# Patient Record
Sex: Female | Born: 1980 | Race: Black or African American | Hispanic: No | Marital: Single | Smoking: Never smoker
Health system: Southern US, Community
[De-identification: ages and names within clinical notes are randomized; demographics above are authoritative.]

## PROBLEM LIST (undated history)

## (undated) ENCOUNTER — Inpatient Hospital Stay: Payer: Self-pay

## (undated) ENCOUNTER — Ambulatory Visit: Admission: EM | Source: Home / Self Care

## (undated) DIAGNOSIS — D259 Leiomyoma of uterus, unspecified: Secondary | ICD-10-CM

## (undated) DIAGNOSIS — G43909 Migraine, unspecified, not intractable, without status migrainosus: Secondary | ICD-10-CM

## (undated) DIAGNOSIS — Z8759 Personal history of other complications of pregnancy, childbirth and the puerperium: Secondary | ICD-10-CM

## (undated) DIAGNOSIS — D649 Anemia, unspecified: Secondary | ICD-10-CM

## (undated) DIAGNOSIS — I10 Essential (primary) hypertension: Secondary | ICD-10-CM

## (undated) DIAGNOSIS — M1990 Unspecified osteoarthritis, unspecified site: Secondary | ICD-10-CM

## (undated) DIAGNOSIS — E538 Deficiency of other specified B group vitamins: Secondary | ICD-10-CM

## (undated) HISTORY — DX: Deficiency of other specified B group vitamins: E53.8

## (undated) HISTORY — DX: Leiomyoma of uterus, unspecified: D25.9

## (undated) HISTORY — DX: Personal history of other complications of pregnancy, childbirth and the puerperium: Z87.59

## (undated) HISTORY — PX: NO PAST SURGERIES: SHX2092

---

## 2005-02-17 ENCOUNTER — Emergency Department: Payer: Self-pay | Admitting: Emergency Medicine

## 2006-01-19 ENCOUNTER — Emergency Department: Payer: Self-pay | Admitting: Emergency Medicine

## 2007-01-01 ENCOUNTER — Observation Stay: Payer: Self-pay | Admitting: Obstetrics and Gynecology

## 2007-03-20 ENCOUNTER — Observation Stay: Payer: Self-pay | Admitting: Certified Nurse Midwife

## 2007-04-28 ENCOUNTER — Observation Stay: Payer: Self-pay

## 2007-06-03 ENCOUNTER — Observation Stay: Payer: Self-pay | Admitting: Obstetrics and Gynecology

## 2007-06-08 ENCOUNTER — Inpatient Hospital Stay: Payer: Self-pay | Admitting: Obstetrics and Gynecology

## 2007-12-31 ENCOUNTER — Emergency Department: Payer: Self-pay | Admitting: Emergency Medicine

## 2008-01-19 ENCOUNTER — Emergency Department: Payer: Self-pay | Admitting: Emergency Medicine

## 2008-01-19 DIAGNOSIS — Z309 Encounter for contraceptive management, unspecified: Secondary | ICD-10-CM | POA: Insufficient documentation

## 2008-01-19 HISTORY — DX: Encounter for contraceptive management, unspecified: Z30.9

## 2008-07-12 ENCOUNTER — Emergency Department: Payer: Self-pay | Admitting: Emergency Medicine

## 2008-11-18 ENCOUNTER — Emergency Department: Payer: Self-pay | Admitting: Emergency Medicine

## 2008-11-27 ENCOUNTER — Encounter: Payer: Self-pay | Admitting: Sports Medicine

## 2008-12-18 ENCOUNTER — Encounter: Payer: Self-pay | Admitting: Sports Medicine

## 2009-01-01 ENCOUNTER — Emergency Department: Payer: Self-pay | Admitting: Emergency Medicine

## 2009-06-15 DIAGNOSIS — R5383 Other fatigue: Secondary | ICD-10-CM | POA: Insufficient documentation

## 2009-06-15 HISTORY — DX: Other fatigue: R53.83

## 2009-09-23 ENCOUNTER — Emergency Department: Payer: Self-pay | Admitting: Emergency Medicine

## 2010-03-26 DIAGNOSIS — R590 Localized enlarged lymph nodes: Secondary | ICD-10-CM | POA: Insufficient documentation

## 2010-03-26 HISTORY — DX: Localized enlarged lymph nodes: R59.0

## 2011-02-25 ENCOUNTER — Emergency Department: Payer: Self-pay | Admitting: Unknown Physician Specialty

## 2011-05-16 ENCOUNTER — Emergency Department: Payer: Self-pay | Admitting: Emergency Medicine

## 2012-03-22 ENCOUNTER — Encounter: Payer: Self-pay | Admitting: Maternal & Fetal Medicine

## 2012-07-26 ENCOUNTER — Observation Stay: Payer: Self-pay | Admitting: Obstetrics and Gynecology

## 2012-07-26 LAB — CBC WITH DIFFERENTIAL/PLATELET
Basophil #: 0 x10 3/mm 3 (ref 0.0–0.1)
Basophil %: 0.2 %
Eosinophil #: 0.1 x10 3/mm 3 (ref 0.0–0.7)
Eosinophil %: 1.6 %
HCT: 23.6 % — ABNORMAL LOW (ref 35.0–47.0)
HGB: 7.5 g/dL — ABNORMAL LOW (ref 12.0–16.0)
Lymphocyte %: 19 %
Lymphs Abs: 1.3 x10 3/mm 3 (ref 1.0–3.6)
MCH: 22.4 pg — ABNORMAL LOW (ref 26.0–34.0)
MCHC: 31.5 g/dL — ABNORMAL LOW (ref 32.0–36.0)
MCV: 71 fL — ABNORMAL LOW (ref 80–100)
Monocyte #: 0.7 x10 3/mm (ref 0.2–0.9)
Monocyte %: 9.5 %
Neutrophil #: 4.8 x10 3/mm 3 (ref 1.4–6.5)
Neutrophil %: 69.7 %
Platelet: 247 x10 3/mm 3 (ref 150–440)
RBC: 3.33 X10 6/mm 3 — ABNORMAL LOW (ref 3.80–5.20)
RDW: 19.3 % — ABNORMAL HIGH (ref 11.5–14.5)
WBC: 6.9 x10 3/mm 3 (ref 3.6–11.0)

## 2012-07-26 LAB — COMPREHENSIVE METABOLIC PANEL WITH GFR
Albumin: 2.5 g/dL — ABNORMAL LOW (ref 3.4–5.0)
Alkaline Phosphatase: 85 U/L (ref 50–136)
Anion Gap: 8 (ref 7–16)
BUN: 4 mg/dL — ABNORMAL LOW (ref 7–18)
Bilirubin,Total: 0.3 mg/dL (ref 0.2–1.0)
Calcium, Total: 8.7 mg/dL (ref 8.5–10.1)
Chloride: 110 mmol/L — ABNORMAL HIGH (ref 98–107)
Co2: 22 mmol/L (ref 21–32)
Creatinine: 0.52 mg/dL — ABNORMAL LOW (ref 0.60–1.30)
EGFR (African American): >60
EGFR (Non-African Amer.): >60
Glucose: 87 mg/dL (ref 65–99)
Osmolality: 276 (ref 275–301)
Potassium: 3.6 mmol/L (ref 3.5–5.1)
SGOT(AST): 21 U/L (ref 15–37)
SGPT (ALT): 13 U/L (ref 12–78)
Sodium: 140 mmol/L (ref 136–145)
Total Protein: 7.1 g/dL (ref 6.4–8.2)

## 2012-07-27 LAB — CBC WITH DIFFERENTIAL/PLATELET
Eosinophil #: 0.1 10*3/uL (ref 0.0–0.7)
HCT: 28.6 % — ABNORMAL LOW (ref 35.0–47.0)
Lymphocyte #: 1.5 10*3/uL (ref 1.0–3.6)
MCH: 23.6 pg — ABNORMAL LOW (ref 26.0–34.0)
MCHC: 31.9 g/dL — ABNORMAL LOW (ref 32.0–36.0)
MCV: 74 fL — ABNORMAL LOW (ref 80–100)
Monocyte #: 0.7 x10 3/mm (ref 0.2–0.9)
Monocyte %: 11.4 %
Neutrophil #: 3.7 10*3/uL (ref 1.4–6.5)
RDW: 21.3 % — ABNORMAL HIGH (ref 11.5–14.5)

## 2012-07-28 ENCOUNTER — Ambulatory Visit: Payer: Self-pay | Admitting: Hematology and Oncology

## 2012-08-03 ENCOUNTER — Ambulatory Visit: Payer: Self-pay | Admitting: Hematology and Oncology

## 2012-08-03 LAB — CBC CANCER CENTER
Basophil #: 0 x10 3/mm (ref 0.0–0.1)
Eosinophil #: 0.1 x10 3/mm (ref 0.0–0.7)
Eosinophil %: 1.7 %
HCT: 31.2 % — ABNORMAL LOW (ref 35.0–47.0)
Lymphocyte #: 1.5 x10 3/mm (ref 1.0–3.6)
Lymphocyte %: 20.5 %
Monocyte %: 4.5 %
Neutrophil #: 5.5 x10 3/mm (ref 1.4–6.5)
Neutrophil %: 72.7 %
Platelet: 240 x10 3/mm (ref 150–440)
RDW: 21.7 % — ABNORMAL HIGH (ref 11.5–14.5)
WBC: 7.5 x10 3/mm (ref 3.6–11.0)

## 2012-08-03 LAB — IRON AND TIBC
Iron Bind.Cap.(Total): 599 ug/dL — ABNORMAL HIGH (ref 250–450)
Unbound Iron-Bind.Cap.: 417 ug/dL

## 2012-08-03 LAB — FERRITIN: Ferritin (ARMC): 10 ng/mL (ref 8–388)

## 2012-08-03 LAB — COMPREHENSIVE METABOLIC PANEL
Albumin: 2.6 g/dL — ABNORMAL LOW (ref 3.4–5.0)
Alkaline Phosphatase: 105 U/L (ref 50–136)
Calcium, Total: 8.7 mg/dL (ref 8.5–10.1)
EGFR (Non-African Amer.): >60
Glucose: 139 mg/dL — ABNORMAL HIGH (ref 65–99)
SGOT(AST): 23 U/L (ref 15–37)
SGPT (ALT): 17 U/L (ref 12–78)

## 2012-08-17 ENCOUNTER — Ambulatory Visit: Payer: Self-pay | Admitting: Hematology and Oncology

## 2012-09-03 ENCOUNTER — Observation Stay: Payer: Self-pay

## 2012-09-03 LAB — URINALYSIS, COMPLETE
Bacteria: NONE SEEN
Bilirubin,UR: NEGATIVE
Blood: NEGATIVE
Glucose,UR: NEGATIVE mg/dL
Ketone: NEGATIVE
Leukocyte Esterase: NEGATIVE
Nitrite: NEGATIVE
Ph: 7
Protein: NEGATIVE
RBC,UR: 1 /HPF
Specific Gravity: 1.015
Squamous Epithelial: <1
Transitional Epi: <1
WBC UR: <1 /HPF

## 2012-09-17 ENCOUNTER — Inpatient Hospital Stay: Payer: Self-pay | Admitting: Obstetrics and Gynecology

## 2012-09-17 LAB — CBC WITH DIFFERENTIAL/PLATELET
Basophil %: 0.3 %
Eosinophil %: 1.5 %
HCT: 29.6 % — ABNORMAL LOW (ref 35.0–47.0)
HGB: 9.5 g/dL — ABNORMAL LOW (ref 12.0–16.0)
Lymphocyte #: 1.7 10*3/uL (ref 1.0–3.6)
MCV: 80 fL (ref 80–100)
Monocyte %: 10 %
Neutrophil #: 4.1 10*3/uL (ref 1.4–6.5)
RBC: 3.71 10*6/uL — ABNORMAL LOW (ref 3.80–5.20)
WBC: 6.6 10*3/uL (ref 3.6–11.0)

## 2012-09-17 LAB — RAPID HIV-1/2 QL/CONFIRM: HIV-1/2,Rapid Ql: NEGATIVE

## 2012-09-18 LAB — HEMOGLOBIN: HGB: 9.1 g/dL — ABNORMAL LOW (ref 12.0–16.0)

## 2013-06-18 ENCOUNTER — Emergency Department: Payer: Self-pay | Admitting: Emergency Medicine

## 2013-06-20 LAB — BETA STREP CULTURE(ARMC)

## 2014-02-27 ENCOUNTER — Emergency Department: Payer: Self-pay | Admitting: Emergency Medicine

## 2014-03-01 LAB — BETA STREP CULTURE(ARMC)

## 2014-07-03 ENCOUNTER — Emergency Department: Payer: Self-pay | Admitting: Emergency Medicine

## 2014-07-03 LAB — CBC
HCT: 34.7 % — ABNORMAL LOW (ref 35.0–47.0)
HGB: 11.5 g/dL — AB (ref 12.0–16.0)
MCH: 29.8 pg (ref 26.0–34.0)
MCHC: 33 g/dL (ref 32.0–36.0)
MCV: 90 fL (ref 80–100)
Platelet: 280 10*3/uL (ref 150–440)
RBC: 3.85 10*6/uL (ref 3.80–5.20)
RDW: 15.3 % — AB (ref 11.5–14.5)
WBC: 12 10*3/uL — ABNORMAL HIGH (ref 3.6–11.0)

## 2014-07-03 LAB — BASIC METABOLIC PANEL
Anion Gap: 11 (ref 7–16)
BUN: 8 mg/dL (ref 7–18)
CO2: 21 mmol/L (ref 21–32)
CREATININE: 0.77 mg/dL (ref 0.60–1.30)
Calcium, Total: 9.5 mg/dL (ref 8.5–10.1)
Chloride: 109 mmol/L — ABNORMAL HIGH (ref 98–107)
EGFR (Non-African Amer.): >60
Glucose: 97 mg/dL (ref 65–99)
OSMOLALITY: 280 (ref 275–301)
POTASSIUM: 3.7 mmol/L (ref 3.5–5.1)
Sodium: 141 mmol/L (ref 136–145)

## 2014-07-03 LAB — URINALYSIS, COMPLETE
BILIRUBIN, UR: NEGATIVE
GLUCOSE, UR: NEGATIVE mg/dL (ref 0–75)
Ketone: NEGATIVE
LEUKOCYTE ESTERASE: NEGATIVE
NITRITE: NEGATIVE
PROTEIN: NEGATIVE
Ph: 6 (ref 4.5–8.0)
RBC,UR: NONE SEEN /HPF (ref 0–5)
Specific Gravity: 1.001 (ref 1.003–1.030)
Squamous Epithelial: 1

## 2014-07-03 LAB — WET PREP, GENITAL

## 2014-07-03 LAB — TROPONIN I

## 2014-07-03 LAB — GC/CHLAMYDIA PROBE AMP

## 2014-11-13 DIAGNOSIS — R300 Dysuria: Secondary | ICD-10-CM

## 2014-11-13 DIAGNOSIS — N6452 Nipple discharge: Secondary | ICD-10-CM | POA: Insufficient documentation

## 2014-11-13 HISTORY — DX: Nipple discharge: N64.52

## 2014-11-13 HISTORY — DX: Dysuria: R30.0

## 2014-11-16 DIAGNOSIS — R87612 Low grade squamous intraepithelial lesion on cytologic smear of cervix (LGSIL): Secondary | ICD-10-CM

## 2014-11-16 HISTORY — DX: Low grade squamous intraepithelial lesion on cytologic smear of cervix (LGSIL): R87.612

## 2015-03-19 ENCOUNTER — Emergency Department
Admission: EM | Admit: 2015-03-19 | Discharge: 2015-03-19 | Disposition: A | Discharge status: Home / Self Care | Payer: No Typology Code available for payment source | Attending: Emergency Medicine | Admitting: Emergency Medicine

## 2015-03-19 ENCOUNTER — Emergency Department: Payer: No Typology Code available for payment source

## 2015-03-19 ENCOUNTER — Encounter: Payer: Self-pay | Admitting: *Deleted

## 2015-03-19 DIAGNOSIS — Y9389 Activity, other specified: Secondary | ICD-10-CM | POA: Insufficient documentation

## 2015-03-19 DIAGNOSIS — S43402A Unspecified sprain of left shoulder joint, initial encounter: Secondary | ICD-10-CM | POA: Diagnosis not present

## 2015-03-19 DIAGNOSIS — Y998 Other external cause status: Secondary | ICD-10-CM | POA: Insufficient documentation

## 2015-03-19 DIAGNOSIS — S0990XA Unspecified injury of head, initial encounter: Secondary | ICD-10-CM | POA: Diagnosis not present

## 2015-03-19 DIAGNOSIS — S4992XA Unspecified injury of left shoulder and upper arm, initial encounter: Secondary | ICD-10-CM | POA: Diagnosis present

## 2015-03-19 DIAGNOSIS — Y92410 Unspecified street and highway as the place of occurrence of the external cause: Secondary | ICD-10-CM | POA: Diagnosis not present

## 2015-03-19 HISTORY — DX: Anemia, unspecified: D64.9

## 2015-03-19 MED ORDER — CYCLOBENZAPRINE HCL 10 MG PO TABS: 10.0000 mg | ORAL_TABLET | Freq: Three times a day (TID) | ORAL | Status: AC | PRN

## 2015-03-19 MED ORDER — IBUPROFEN 800 MG PO TABS: 800.0000 mg | ORAL_TABLET | Freq: Three times a day (TID) | ORAL | Status: AC | PRN

## 2015-03-19 MED ORDER — TRAMADOL HCL 50 MG PO TABS: 50.0000 mg | ORAL_TABLET | Freq: Four times a day (QID) | ORAL | Status: AC | PRN
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
## 2015-03-19 NOTE — ED Notes (Signed)
Pt took home 1000mg  tylenol

## 2015-03-27 NOTE — H&P (Signed)
L&D Evaluation:  History:   HPI 34yo G4P300 with LMP of 01/05/12 & EDD of 10/06/12 [facility] with "LOF today waking her up". Pt just got up to BR and poured clear mucoid fluid from vagina. PNC at [facility] significant for anemia, blood transfusion, hx of pre-eclampsia.    Presents with leaking fluid    Patient's Medical History Anemia x 11 yrs    Patient's Surgical History Rt knee injury, wisdom teeth    Medications Pre Natal Vitamins    Allergies NKDA    Social History none    Family History Non-Contributory   ROS:   ROS All systems were reviewed.  HEENT, CNS, GI, GU, Respiratory, CV, Renal and Musculoskeletal systems were found to be normal.   Exam:   Vital Signs stable  BP 133/80    General no apparent distress    Mental Status clear    Chest clear    Heart normal sinus rhythm, no murmur/gallop/rubs    Abdomen gravid, non-tender    Estimated Fetal Weight Average for gestational age    Back no CVAT    Edema 1+    Reflexes 2+    Clonus negative    Pelvic 1 cm    Mebranes Ruptured, 0300    Description clear    FHT +accels, no decels    Ucx irregular    Skin dry    Lymph no lymphadenopathy   Impression:   Impression IUP at 37 1/7 weeks with SROM   Plan:   Plan monitor contractions and for cervical change, Admit to [facility]   Electronic Signatures: [name] (CNM)  (Signed [id number] 06:29)  Authored: L&D Evaluation   Last Updated: 01-Nov-13 06:29 by [name] (CNM)

## 2015-03-27 NOTE — H&P (Signed)
L&D Evaluation:  History:   HPI 34 y/o G4P3003 @ 29/5wks EDC 10/06/12 seen at [facility] 07/23/12 for 29 wk pnc with 28wk labs drawn. Anemia 7.6mg . Had been taking PNV daily and liquid/elixir fe supplement as was unable to afford cost of fe (uncovered by medicaid). HX of anemia x 11 years with transfusion of 1 unit packed red blood cells @ 37wks. Obesity, Elevated glucola 157. Negative sickle cell trait. Recent Cardiology consult (SOB tachycardia) nl per pt-await report. Pre eclampsia 1st pregnancy. Denies leaking fluid, vaginal bleeding, or contractions, baby is active.    Presents with above    Patient's Medical History severe anemia    Patient's Surgical History none    Medications Pre Natal Vitamins  Iron    Allergies NKDA    Social History none    Family History Non-Contributory   ROS:   ROS All systems were reviewed.  HEENT, CNS, GI, GU, Respiratory, CV, Renal and Musculoskeletal systems were found to be normal.   Exam:   Vital Signs stable    Urine Protein not completed    General no apparent distress    Mental Status clear    Chest clear    Heart normal sinus rhythm    Abdomen gravid, non-tender    Estimated Fetal Weight Average for gestational age    Fundal Height appropriate    Back no CVAT    Edema no edema    Reflexes 1+    Clonus negative    Pelvic no SVE    Mebranes Intact    FHT baseline 130's avg variability occ accel    FHT Description 126    Ucx absent    Skin dry    Lymph no lymphadenopathy   Impression:   Impression Severe anemia- [redacted]wks gestation Transfuse 2 units PRBC's   Plan:   Plan EFM/NST    Comments TJS consulted, pt notified of need to receive 2 units PRBC's due to severe anemia. Explained plan of care, risks/benefits of blood transfusions, questions answered, consent obtained, labs drawn. WIll have NST on LD and then move to [place] for transfusion. Pt will follow up at [facility] 07/27/12 for growth [place] and schedule next PNC/3hr glucola.    Electronic Signatures: Rosie Fate (CNM)  (Signed 09-Sep-13 14:15)  Authored: L&D Evaluation   Last Updated: 09-Sep-13 14:15 by Rosie Fate (CNM)

## 2015-09-17 ENCOUNTER — Emergency Department
Admission: EM | Admit: 2015-09-17 | Discharge: 2015-09-17 | Disposition: A | Discharge status: Home / Self Care | Payer: Self-pay | Attending: Emergency Medicine | Admitting: Emergency Medicine

## 2015-09-17 ENCOUNTER — Emergency Department: Payer: Self-pay

## 2015-09-17 ENCOUNTER — Encounter: Payer: Self-pay | Admitting: Emergency Medicine

## 2015-09-17 DIAGNOSIS — R0602 Shortness of breath: Secondary | ICD-10-CM | POA: Insufficient documentation

## 2015-09-17 DIAGNOSIS — R079 Chest pain, unspecified: Secondary | ICD-10-CM | POA: Insufficient documentation

## 2015-09-17 LAB — BASIC METABOLIC PANEL
ANION GAP: 8 (ref 5–15)
BUN: 7 mg/dL (ref 6–20)
CHLORIDE: 106 mmol/L (ref 101–111)
CO2: 24 mmol/L (ref 22–32)
Calcium: 9.4 mg/dL (ref 8.9–10.3)
Creatinine, Ser: 0.6 mg/dL (ref 0.44–1.00)
GFR calc Af Amer: >60 mL/min (ref >60)
GFR calc non Af Amer: >60 mL/min (ref >60)
GLUCOSE: 91 mg/dL (ref 65–99)
Potassium: 3.4 mmol/L — ABNORMAL LOW (ref 3.5–5.1)
Sodium: 138 mmol/L (ref 135–145)

## 2015-09-17 LAB — CBC
HCT: 33.2 % — ABNORMAL LOW (ref 35.0–47.0)
HEMOGLOBIN: 10.6 g/dL — AB (ref 12.0–16.0)
MCH: 25.1 pg — ABNORMAL LOW (ref 26.0–34.0)
MCHC: 32 g/dL (ref 32.0–36.0)
MCV: 78.3 fL — AB (ref 80.0–100.0)
Platelets: 319 10*3/uL (ref 150–440)
RBC: 4.24 MIL/uL (ref 3.80–5.20)
RDW: 16.4 % — ABNORMAL HIGH (ref 11.5–14.5)
WBC: 6.8 10*3/uL (ref 3.6–11.0)

## 2015-09-17 LAB — TROPONIN I: Troponin I: <0.03 ng/mL (ref <0.031)

## 2015-09-17 MED ORDER — IBUPROFEN 800 MG PO TABS: 800.0000 mg | ORAL_TABLET | Freq: Three times a day (TID) | ORAL | Status: DC | PRN

## 2015-09-17 MED ORDER — DIAZEPAM 5 MG PO TABS: 5.0000 mg | ORAL_TABLET | Freq: Three times a day (TID) | ORAL | Status: DC | PRN

## 2015-09-17 NOTE — Discharge Instructions (Signed)

## 2015-09-17 NOTE — ED Notes (Signed)
Pt to ed with c/o chest pain that started last night, intermittent with sob with back pain described as stabbing.

## 2015-09-17 NOTE — ED Provider Notes (Signed)
[facility] Emergency Department Provider Note     Time seen: ----------------------------------------- 2:36 PM on 09/17/2015 -----------------------------------------    I have reviewed the triage vital signs and the nursing notes.   HISTORY  Chief Complaint Chest Pain    HPI Cindy Hays is a 34 y.o. female who presents ER for chest pain started last night, she's had intermittent shortness of breath with back pain she describes as stabbing. Nothing seems to make her symptoms better or worse. Patient states the pain was under the right breast and radiated into her back. Hasn't had any heavy physical activity recently, denies any recent illness.   Past Medical History  Diagnosis Date  . Anemia     There are no active problems to display for this patient.   History reviewed. No pertinent past surgical history.  Allergies Vicodin  Social History Social History  Substance Use Topics  . Smoking status: Never Smoker   . Smokeless tobacco: None  . Alcohol Use: No    Review of Systems Constitutional: Negative for fever. Eyes: Negative for visual changes. ENT: Negative for sore throat. Cardiovascular: Positive for chest pain Respiratory: Positive for shortness of breath Gastrointestinal: Negative for abdominal pain, vomiting and diarrhea. Genitourinary: Negative for dysuria. Musculoskeletal: Negative for back pain. Skin: Negative for rash. Neurological: Negative for headaches, focal weakness or numbness.  10-point ROS otherwise negative.  ____________________________________________   PHYSICAL EXAM:  VITAL SIGNS: ED Triage Vitals  Enc Vitals Group     BP --      Pulse --      Resp --      Temp --      Temp src --      SpO2 --      Weight --      Height --      Head Cir --      Peak Flow --      Pain Score 09/17/15 1238 6     Pain Loc --      Pain Edu? --      Excl. in [place]? --     Constitutional: Alert and oriented.  Well appearing and in no distress. Eyes: Conjunctivae are normal. PERRL. Normal extraocular movements. ENT   Head: Normocephalic and atraumatic.   Nose: No congestion/rhinnorhea.   Mouth/Throat: Mucous membranes are moist.   Neck: No stridor. Cardiovascular: Normal rate, regular rhythm. Normal and symmetric distal pulses are present in all extremities. No murmurs, rubs, or gallops. Respiratory: Normal respiratory effort without tachypnea nor retractions. Breath sounds are clear and equal bilaterally. No wheezes/rales/rhonchi. Gastrointestinal: Soft and nontender. No distention. No abdominal bruits.  Musculoskeletal: Nontender with normal range of motion in all extremities. No joint effusions.  No lower extremity tenderness nor edema. Neurologic:  Normal speech and language. No gross focal neurologic deficits are appreciated. Speech is normal. No gait instability. Skin:  Skin is warm, dry and intact. No rash noted. Psychiatric: Mood and affect are normal. Speech and behavior are normal. Patient exhibits appropriate insight and judgment. ____________________________________________  EKG: Interpreted by me. Normal sinus rhythm with normal axis normal intervals. No evidence of hypertrophy or acute infarction. Grossly unremarkable EKG with rate of 94 bpm  ____________________________________________  ED COURSE:  Pertinent labs & imaging results that were available during my care of the patient were reviewed by me and considered in my medical decision making (see chart for details). Patient is in no acute distress, will check cardiac labs and x-ray and reevaluate. ____________________________________________  LABS (pertinent positives/negatives)  Labs Reviewed  BASIC METABOLIC PANEL - Abnormal; Notable for the following:    Potassium 3.4 (*)    All other components within normal limits  CBC - Abnormal; Notable for the following:    Hemoglobin 10.6 (*)    HCT 33.2 (*)     MCV 78.3 (*)    MCH 25.1 (*)    RDW 16.4 (*)    All other components within normal limits  TROPONIN I    RADIOLOGY  Chest x-ray IMPRESSION: No active cardiopulmonary disease. ____________________________________________  FINAL ASSESSMENT AND PLAN  Chest pain  Plan: Patient with labs and imaging as dictated above. Chest pain is likely musculoskeletal. She is low risk according to heart score. She is perc negative with less than 2% chance of PE. She does have mild iron deficiency anemia and is taking iron. Again I think this is likely musculoskeletal, she discharged Motrin and Valium and follow-up in 2 days with her doctor.   Earleen Newport, MD   Earleen Newport, MD 09/17/15 [phone]

## 2015-12-08 ENCOUNTER — Encounter: Payer: Self-pay | Admitting: *Deleted

## 2015-12-08 ENCOUNTER — Emergency Department
Admission: EM | Admit: 2015-12-08 | Discharge: 2015-12-08 | Disposition: A | Discharge status: Home / Self Care | Payer: Self-pay | Attending: Student | Admitting: Student

## 2015-12-08 ENCOUNTER — Emergency Department: Payer: Self-pay

## 2015-12-08 DIAGNOSIS — R05 Cough: Secondary | ICD-10-CM

## 2015-12-08 DIAGNOSIS — J069 Acute upper respiratory infection, unspecified: Secondary | ICD-10-CM | POA: Insufficient documentation

## 2015-12-08 DIAGNOSIS — R059 Cough, unspecified: Secondary | ICD-10-CM

## 2015-12-08 LAB — POCT RAPID STREP A: STREPTOCOCCUS, GROUP A SCREEN (DIRECT): NEGATIVE

## 2015-12-08 MED ORDER — BENZONATATE 100 MG PO CAPS: 100.0000 mg | ORAL_CAPSULE | Freq: Three times a day (TID) | ORAL | Status: AC | PRN

## 2015-12-08 NOTE — ED Provider Notes (Signed)
[facility] Emergency Department Provider Note  ____________________________________________  Time seen: Approximately 7:08 AM  I have reviewed the triage vital signs and the nursing notes.   HISTORY  Chief Complaint Cough    HPI Cindy Hays is a 35 y.o. female with history of anemia who presents for evaluation of 3 days of harsh-sounding cough, gradual onset, constant, currently moderate, no modifying factors. She is also had sore throat. Initially the cough was nonproductive however it has since become productive. She is not sure if she has had fevers but she has had "hot flashes". She denies any chest pain. No vomiting, diarrhea, fevers or chills. She felt mildly short of breath this morning but that has resolved.   Past Medical History  Diagnosis Date  . Anemia     There are no active problems to display for this patient.   History reviewed. No pertinent past surgical history.  Current Outpatient Rx  Name  Route  Sig  Dispense  Refill  . benzonatate (TESSALON PERLES) 100 MG capsule   Oral   Take 1 capsule (100 mg total) by mouth every 8 (eight) hours as needed for cough.   12 capsule   0   . diazepam (VALIUM) 5 MG tablet   Oral   Take 1 tablet (5 mg total) by mouth every 8 (eight) hours as needed for muscle spasms.   20 tablet   0   . ibuprofen (ADVIL,MOTRIN) 800 MG tablet   Oral   Take 1 tablet (800 mg total) by mouth every 8 (eight) hours as needed.   30 tablet   0     Allergies Vicodin  History reviewed. No pertinent family history.  Social History Social History  Substance Use Topics  . Smoking status: Never Smoker   . Smokeless tobacco: None  . Alcohol Use: No    Review of Systems Constitutional: No chills Eyes: No visual changes. ENT: + sore throat. Cardiovascular: Denies chest pain. Respiratory: + mild shortness of breath. Gastrointestinal: No abdominal pain.  No nausea, no vomiting.  No diarrhea.  No  constipation. Genitourinary: Negative for dysuria. Musculoskeletal: Negative for back pain. Skin: Negative for rash. Neurological: Negative for headaches, focal weakness or numbness.  10-point ROS otherwise negative.  ____________________________________________   PHYSICAL EXAM:  VITAL SIGNS: ED Triage Vitals  Enc Vitals Group     BP 12/08/15 0618 122/83 mmHg     Pulse Rate 12/08/15 0618 95     Resp 12/08/15 0618 18     Temp 12/08/15 0618 98.4 F (36.9 C)     Temp Source 12/08/15 0618 Oral     SpO2 12/08/15 0618 88 %     Weight 12/08/15 0618 196 lb (88.905 kg)     Height 12/08/15 0618 5\' 1"  (1.549 m)     Head Cir --      Peak Flow --      Pain Score 12/08/15 0630 6     Pain Loc --      Pain Edu? --      Excl. in [place]? --     Constitutional: Alert and oriented. Well appearing and in no acute distress. Eyes: Conjunctivae are normal. PERRL. EOMI. Head: Atraumatic. Nose: No congestion/rhinnorhea. Mouth/Throat: Mucous membranes are moist.  Oropharynx is mildly erythematous. Neck: No stridor. Tender anterior cervical lymphadenopathy. Cardiovascular: Normal rate, regular rhythm. Grossly normal heart sounds.  Good peripheral circulation. Respiratory: Normal respiratory effort.  No retractions. Lungs CTAB. Gastrointestinal: Soft and nontender. No distention. No  CVA tenderness. Genitourinary: deferred Musculoskeletal: No lower extremity tenderness nor edema.  No joint effusions. Neurologic:  Normal speech and language. No gross focal neurologic deficits are appreciated. No gait instability. Skin:  Skin is warm, dry and intact. No rash noted. Psychiatric: Mood and affect are normal. Speech and behavior are normal.  ____________________________________________   LABS (all labs ordered are listed, but only abnormal results are displayed)  Labs Reviewed  CULTURE, GROUP A STREP [facility])  POCT RAPID STREP A    ____________________________________________  EKG  none ____________________________________________  RADIOLOGY  CXR IMPRESSION: No active disease.  ____________________________________________   PROCEDURES  Procedure(s) performed: None  Critical Care performed: No  ____________________________________________   INITIAL IMPRESSION / ASSESSMENT AND PLAN / ED COURSE  Pertinent labs & imaging results that were available during my care of the patient were reviewed by me and considered in my medical decision making (see chart for details).  Cindy Hays is a 34 y.o. female with history of anemia who presents for evaluation of 3 days of harsh-sounding cough as well as sore throat. On exam, she is very well-appearing and in no acute distress, sitting up in bed, talkative. Lungs clear to auscultation bilaterally. Vital signs stable, she is afebrile. Strep negative. Chest x-ray shows no active disease. Suspect symptoms secondary to viral URI. We'll DC with symptomatic support, Tessalon Perles. Discussed return precautions and need for close PCP follow-up and she is comfortable with the discharge plan. DC home. ____________________________________________   FINAL CLINICAL IMPRESSION(S) / ED DIAGNOSES  Final diagnoses:  Cough  URI (upper respiratory infection)      Joanne Gavel, MD 12/08/15 1551

## 2015-12-08 NOTE — ED Notes (Signed)
Pt c/o cold sxs since Monday, cough since Wed. Pt has a barking, dry, non-productive cough that has worsened over time. Pt c/o sore throat associated w/ cough. Pt denies fever. Pt denies relief from OTC meds.

## 2015-12-10 LAB — CULTURE, GROUP A STREP (THRC)

## 2016-08-07 ENCOUNTER — Emergency Department
Admission: EM | Admit: 2016-08-07 | Discharge: 2016-08-07 | Disposition: A | Discharge status: Home / Self Care | Payer: No Typology Code available for payment source | Attending: Emergency Medicine | Admitting: Emergency Medicine

## 2016-08-07 ENCOUNTER — Encounter: Payer: Self-pay | Admitting: Emergency Medicine

## 2016-08-07 DIAGNOSIS — Y92410 Unspecified street and highway as the place of occurrence of the external cause: Secondary | ICD-10-CM | POA: Diagnosis not present

## 2016-08-07 DIAGNOSIS — Y9389 Activity, other specified: Secondary | ICD-10-CM | POA: Insufficient documentation

## 2016-08-07 DIAGNOSIS — Z79899 Other long term (current) drug therapy: Secondary | ICD-10-CM | POA: Diagnosis not present

## 2016-08-07 DIAGNOSIS — Z791 Long term (current) use of non-steroidal anti-inflammatories (NSAID): Secondary | ICD-10-CM | POA: Diagnosis not present

## 2016-08-07 DIAGNOSIS — S20219A Contusion of unspecified front wall of thorax, initial encounter: Secondary | ICD-10-CM | POA: Insufficient documentation

## 2016-08-07 DIAGNOSIS — Y999 Unspecified external cause status: Secondary | ICD-10-CM | POA: Diagnosis not present

## 2016-08-07 DIAGNOSIS — S39012A Strain of muscle, fascia and tendon of lower back, initial encounter: Secondary | ICD-10-CM | POA: Diagnosis not present

## 2016-08-07 DIAGNOSIS — S3992XA Unspecified injury of lower back, initial encounter: Secondary | ICD-10-CM | POA: Diagnosis present

## 2016-08-07 MED ORDER — CYCLOBENZAPRINE HCL 5 MG PO TABS: 5.0000 mg | ORAL_TABLET | Freq: Three times a day (TID) | ORAL | 0 refills | Status: DC | PRN
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
## 2016-08-07 NOTE — Discharge Instructions (Signed)
Take the prescription as directed for muscle strain. Apply ice to the sore muscles for continue muscle pain relief. Dose over-the-counter ibuprofen for nausea or pain relief. Follow with the primary care provider or local community clinic for ongoing symptom management.

## 2016-11-17 NOTE — L&D Delivery Note (Signed)
Delivery Note At 6:15 PM a non-viable and deceased female "Baby DJ" was delivered via Vaginal, Spontaneous (Presentation: cephalic ).  APGAR:0 ,0 ; weight 475g.   Placenta status: spontaneous, delivered with fetus.  Cord:    Anesthesia:  None Episiotomy: None Lacerations: None Est. Blood Loss (mL):  100cc  Mom to postpartum.  Baby to [place].  36yo [id number] at 26+4wks presenting for iol for fetal demise, dx by ultrasound at 26+1wks. 3 doses of 298mcg pv cytotec affected cervical change. AROM for clear fluid just before fetal delivery in cephalic position, simultaneously delivering placenta with minimal blood loss. Fundus firm, small clot in LUS extracted. No lacerations. No signs of life.  Evaluation of the fetus did not reveal any anatomic abnormalities, though skin sloughing and cord and feet edema were noted. His left eye was open and appeared normal. Hands, feet and extremities are normal. Spine straight, eyes symmetrical, ears not low set. He appears younger than 26 wks by about 2 weeks.  1x1cm placenta at base of cord collected and placed in room temp Hank's solution, and taken to the lab for chromosome collection. Placenta will be sent to pathology.  Patient tolerated the procedures well physically and emotionally. Her sisters and the FOB were at the bedside throughout.  Lisette Grinder, CNM, was present throughout.   Benjaman Kindler 11/13/2017, 6:51 PM

## 2017-06-15 DIAGNOSIS — Z3201 Encounter for pregnancy test, result positive: Secondary | ICD-10-CM

## 2017-06-15 HISTORY — DX: Encounter for pregnancy test, result positive: Z32.01

## 2017-06-19 ENCOUNTER — Other Ambulatory Visit: Payer: Self-pay | Admitting: Family Medicine

## 2017-06-19 DIAGNOSIS — Z3201 Encounter for pregnancy test, result positive: Secondary | ICD-10-CM

## 2017-06-22 ENCOUNTER — Ambulatory Visit
Admission: RE | Admit: 2017-06-22 | Discharge: 2017-06-22 | Disposition: A | Discharge status: Home / Self Care | Payer: Medicaid Other | Source: Ambulatory Visit | Attending: Family Medicine | Admitting: Family Medicine

## 2017-06-22 DIAGNOSIS — Z3201 Encounter for pregnancy test, result positive: Secondary | ICD-10-CM

## 2017-06-22 DIAGNOSIS — Z3687 Encounter for antenatal screening for uncertain dates: Secondary | ICD-10-CM | POA: Diagnosis not present

## 2017-06-22 DIAGNOSIS — Z3A01 Less than 8 weeks gestation of pregnancy: Secondary | ICD-10-CM | POA: Insufficient documentation

## 2017-06-27 DIAGNOSIS — O0990 Supervision of high risk pregnancy, unspecified, unspecified trimester: Secondary | ICD-10-CM | POA: Insufficient documentation

## 2017-06-27 HISTORY — DX: Supervision of high risk pregnancy, unspecified, unspecified trimester: O09.90

## 2017-06-28 ENCOUNTER — Ambulatory Visit
Admission: EM | Admit: 2017-06-28 | Discharge: 2017-06-28 | Disposition: A | Discharge status: Home / Self Care | Payer: Self-pay | Attending: Family Medicine | Admitting: Family Medicine

## 2017-06-28 ENCOUNTER — Encounter: Payer: Self-pay | Admitting: Gynecology

## 2017-06-28 DIAGNOSIS — J020 Streptococcal pharyngitis: Secondary | ICD-10-CM | POA: Diagnosis not present

## 2017-06-28 LAB — RAPID STREP SCREEN (MED CTR MEBANE ONLY): Streptococcus, Group A Screen (Direct): POSITIVE — AB

## 2017-06-28 MED ORDER — CEFDINIR 300 MG PO CAPS: 300.0000 mg | ORAL_CAPSULE | Freq: Two times a day (BID) | ORAL | 0 refills | Status: DC

## 2017-06-28 MED ORDER — CEFDINIR 250 MG/5ML PO SUSR: 300.0000 mg | Freq: Two times a day (BID) | ORAL | 0 refills | Status: DC

## 2017-06-28 NOTE — ED Provider Notes (Signed)
[facility]    CSN: [id number] Arrival date & time: 06/28/17  7371  History   Chief Complaint Chief Complaint  Patient presents with  . Sore Throat   HPI  35 year old female who is currently [redacted] weeks pregnant presents with complaints of sore throat.  Sore throat  2 days.  Severe.  No associated fever or chills.  She reports cervical lymphadenopathy as well.  She's been using Tylenol with little improvement.  No reported sick contacts.  No known exacerbating factors.  No other associated symptoms or complaints at this time.  PMH - Anemia, Galactorrhea, obesity.  History reviewed. No pertinent surgical history.  OB History    Gravida Para Term Preterm AB Living   1             SAB TAB Ectopic Multiple Live Births                 Home Medications    Prior to Admission medications   Medication Sig Start Date End Date Taking? Authorizing Provider  Prenatal Vit-Fe Fumarate-FA (MULTIVITAMIN-PRENATAL) 27-0.8 MG TABS tablet Take 1 tablet by mouth daily at 12 noon.   Yes [provider]  cefdinir (OMNICEF) 250 MG/5ML suspension Take 6 mLs (300 mg total) by mouth 2 (two) times daily. 06/28/17   Coral Spikes, DO   Family History Patient did not report any family history.  Social History Social History  Substance Use Topics  . Smoking status: Never Smoker  . Smokeless tobacco: Never Used  . Alcohol use No   Allergies   Amoxicillin and Vicodin [hydrocodone-acetaminophen]   Review of Systems Review of Systems  HENT: Positive for sore throat.   Hematological: Positive for adenopathy.  All other systems reviewed and are negative.  Physical Exam Triage Vital Signs ED Triage Vitals  Enc Vitals Group     BP 06/28/17 0838 111/67     Pulse Rate 06/28/17 0838 91     Resp 06/28/17 0838 16     Temp 06/28/17 0838 98.9 F (37.2 C)     Temp Source 06/28/17 0838 Oral     SpO2 06/28/17 0838 100 %     Weight 06/28/17 0839 182 lb (82.6 kg)   Height 06/28/17 0839 5\' 2"  (1.575 m)     Head Circumference --      Peak Flow --      Pain Score 06/28/17 0839 9     Pain Loc --      Pain Edu? --      Excl. in [place]? --     Updated Vital Signs BP 111/67 (BP Location: Left Arm)   Pulse 91   Temp 98.9 F (37.2 C) (Oral)   Resp 16   Ht 5\' 2"  (1.575 m)   Wt 182 lb (82.6 kg)   LMP 04/28/2017 Comment: pregnant  SpO2 100%   BMI 33.29 kg/m   Physical Exam  Constitutional: She is oriented to person, place, and time. She appears well-developed. No distress.  HENT:  Head: Normocephalic and atraumatic.  Oropharynx with large tonsils with mild exudate. Erythema noted. Normal TMs bilaterally.  Eyes: Conjunctivae are normal. No scleral icterus.  Neck: Neck supple.  Tender adenopathy noted.  Cardiovascular: Normal rate and regular rhythm.   No murmur heard. Pulmonary/Chest: Effort normal and breath sounds normal. She has no wheezes. She has no rales.  Abdominal: Soft. She exhibits no distension.  Musculoskeletal: Normal range of motion.  Neurological: She is alert and oriented to person,  place, and time.  Skin: Skin is warm. No rash noted.  Psychiatric: She has a normal mood and affect.  Vitals reviewed.  UC Treatments / Results  Labs (all labs ordered are listed, but only abnormal results are displayed) Labs Reviewed  RAPID STREP SCREEN (NOT AT [facility]) - Abnormal; Notable for the following:       Result Value   Streptococcus, Group A Screen (Direct) POSITIVE (*)    All other components within normal limits    EKG  EKG Interpretation None      Radiology No results found.  Procedures Procedures (including critical care time)  Medications Ordered in UC Medications - No data to display   Initial Impression / Assessment and Plan / UC Course  I have reviewed the triage vital signs and the nursing notes.  Pertinent labs & imaging results that were available during my care of the patient were reviewed by me and considered  in my medical decision making (see chart for details).   36 year old female who is [redacted] weeks pregnant presents with sore throat. Rapid strep positive. Has amoxicillin allergy. Treating with Omnicef. Patient requested liquid suspension  Final Clinical Impressions(s) / UC Diagnoses   Final diagnoses:  Strep pharyngitis    New Prescriptions New Prescriptions   CEFDINIR (OMNICEF) 250 MG/5ML SUSPENSION    Take 6 mLs (300 mg total) by mouth 2 (two) times daily.    Controlled Substance Prescriptions [place] Controlled Substance Registry consulted? Not Applicable   Coral Spikes, DO 06/28/17 4628

## 2017-06-28 NOTE — ED Triage Notes (Signed)
Patient c/o x yesterday painful swallowing and swollen glands.

## 2017-06-28 NOTE — Discharge Instructions (Signed)
Antibiotic is safe in pregnancy.  Take twice daily x 10 days.  Take care  Dr. Lacinda Axon

## 2017-07-02 ENCOUNTER — Other Ambulatory Visit: Payer: Self-pay | Admitting: Family Medicine

## 2017-07-02 DIAGNOSIS — Z3481 Encounter for supervision of other normal pregnancy, first trimester: Secondary | ICD-10-CM

## 2017-07-10 ENCOUNTER — Ambulatory Visit
Admission: RE | Admit: 2017-07-10 | Discharge: 2017-07-10 | Disposition: A | Discharge status: Home / Self Care | Payer: Medicaid Other | Source: Ambulatory Visit | Attending: Family Medicine | Admitting: Family Medicine

## 2017-07-10 DIAGNOSIS — Z3481 Encounter for supervision of other normal pregnancy, first trimester: Secondary | ICD-10-CM | POA: Insufficient documentation

## 2017-07-23 DIAGNOSIS — E669 Obesity, unspecified: Secondary | ICD-10-CM | POA: Insufficient documentation

## 2017-07-24 ENCOUNTER — Other Ambulatory Visit: Payer: Self-pay | Admitting: Obstetrics and Gynecology

## 2017-07-24 DIAGNOSIS — Z369 Encounter for antenatal screening, unspecified: Secondary | ICD-10-CM

## 2017-07-29 DIAGNOSIS — B009 Herpesviral infection, unspecified: Secondary | ICD-10-CM | POA: Insufficient documentation

## 2017-07-30 DIAGNOSIS — B977 Papillomavirus as the cause of diseases classified elsewhere: Secondary | ICD-10-CM | POA: Insufficient documentation

## 2017-08-06 ENCOUNTER — Inpatient Hospital Stay: Payer: No Typology Code available for payment source | Admitting: Oncology

## 2017-08-10 ENCOUNTER — Ambulatory Visit: Payer: No Typology Code available for payment source

## 2017-08-10 ENCOUNTER — Inpatient Hospital Stay: Payer: No Typology Code available for payment source | Attending: Oncology | Admitting: Oncology

## 2017-08-10 ENCOUNTER — Ambulatory Visit: Payer: No Typology Code available for payment source | Attending: Obstetrics and Gynecology

## 2017-08-10 NOTE — Progress Notes (Deleted)
Hematology/Oncology Consult note [facility] Telephone:([phone] [phone]  Patient Care Team: Herminio Commons, MD as PCP - General (Family Medicine)   Name of the patient: Cindy Hays  [id number]  09/16/1981    Reason for referral- iron deficiency anemia   Referring physician- Dr. Gwynneth Aliment  Date of visit: 08/10/17   History of presenting illness- patient is a 36 year old female who is currently [redacted] weeks pregnant she is G5P3L3. Recent labs from 07/23/2017 showed white count of 6.1, H&H of 9.1/28.8 with an MCV of 74 and a platelet count of 380. I do not have any recent iron studies. Myoglobin electrophoresis was within normal limits. Urinalysis showed 10 RBCs. HIV testing was negative.  ECOG PS- ***  Pain scale- ***   Review of systems- Review of Systems  Constitutional: Negative for chills, fever, malaise/fatigue and weight loss.  HENT: Negative for congestion, ear discharge and nosebleeds.   Eyes: Negative for blurred vision.  Respiratory: Negative for cough, hemoptysis, sputum production, shortness of breath and wheezing.   Cardiovascular: Negative for chest pain, palpitations, orthopnea and claudication.  Gastrointestinal: Negative for abdominal pain, blood in stool, constipation, diarrhea, heartburn, melena, nausea and vomiting.  Genitourinary: Negative for dysuria, flank pain, frequency, hematuria and urgency.  Musculoskeletal: Negative for back pain, joint pain and myalgias.  Skin: Negative for rash.  Neurological: Negative for dizziness, tingling, focal weakness, seizures, weakness and headaches.  Endo/Heme/Allergies: Does not bruise/bleed easily.  Psychiatric/Behavioral: Negative for depression and suicidal ideas. The patient does not have insomnia.     Allergies  Allergen Reactions  . Amoxicillin Rash  . Vicodin [Hydrocodone-Acetaminophen] Rash    There are no active problems to display for this patient.    Past  Medical History:  Diagnosis Date  . Anemia      No past surgical history on file.  Social History   Social History  . Marital status: Single    Spouse name: N/A  . Number of children: N/A  . Years of education: N/A   Occupational History  . Not on file.   Social History Main Topics  . Smoking status: Never Smoker  . Smokeless tobacco: Never Used  . Alcohol use No  . Drug use: No  . Sexual activity: Yes   Other Topics Concern  . Not on file   Social History Narrative  . No narrative on file     No family history on file.   Current Outpatient Prescriptions:  .  cefdinir (OMNICEF) 250 MG/5ML suspension, Take 6 mLs (300 mg total) by mouth 2 (two) times daily., Disp: 120 mL, Rfl: 0 .  Prenatal Vit-Fe Fumarate-FA (MULTIVITAMIN-PRENATAL) 27-0.8 MG TABS tablet, Take 1 tablet by mouth daily at 12 noon., Disp: , Rfl:    Physical exam: There were no vitals filed for this visit. Physical Exam  Constitutional: She is oriented to person, place, and time and well-developed, well-nourished, and in no distress.  HENT:  Head: Normocephalic and atraumatic.  Eyes: Pupils are equal, round, and reactive to light. EOM are normal.  Neck: Normal range of motion.  Cardiovascular: Normal rate, regular rhythm and normal heart sounds.   Pulmonary/Chest: Effort normal and breath sounds normal.  Abdominal: Soft. Bowel sounds are normal.  Neurological: She is alert and oriented to person, place, and time.  Skin: Skin is warm and dry.       CMP Latest Ref Rng & Units 09/17/2015  Glucose 65 - 99 mg/dL 91  BUN 6 - 20 mg/dL  7  Creatinine 0.44 - 1.00 mg/dL 0.60  Sodium 135 - 145 mmol/L 138  Potassium 3.5 - 5.1 mmol/L 3.4(L)  Chloride 101 - 111 mmol/L 106  CO2 22 - 32 mmol/L 24  Calcium 8.9 - 10.3 mg/dL 9.4  Total Protein 6.4 - 8.2 g/dL -  Total Bilirubin 0.2 - 1.0 mg/dL -  Alkaline Phos 50 - 136 Unit/L -  AST 15 - 37 Unit/L -  ALT 12 - 78 U/L -   CBC Latest Ref Rng & Units  09/17/2015  WBC 3.6 - 11.0 K/uL 6.8  Hemoglobin 12.0 - 16.0 g/dL 10.6(L)  Hematocrit 35.0 - 47.0 % 33.2(L)  Platelets 150 - 440 K/uL 319     Assessment and plan- Patient is a 36 y.o. female referred for microcytic anemia of pregnancy   Microcytic anemia- likely due to iron deficiency- I will check cbc with diff, ferritin and iron studies and B12, folate today.     Thank you for this kind referral and the opportunity to participate in the care of this patient   Visit Diagnosis 1. Microcytic anemia     Dr. Randa Evens, MD, MPH [place] at [facility] Pager- [phone] 08/10/2017

## 2017-10-22 DIAGNOSIS — O09299 Supervision of pregnancy with other poor reproductive or obstetric history, unspecified trimester: Secondary | ICD-10-CM | POA: Insufficient documentation

## 2017-11-10 ENCOUNTER — Observation Stay
Admission: EM | Admit: 2017-11-10 | Discharge: 2017-11-10 | Disposition: A | Discharge status: Home / Self Care | Payer: Medicaid Other | Attending: Obstetrics and Gynecology | Admitting: Obstetrics and Gynecology

## 2017-11-10 ENCOUNTER — Observation Stay: Payer: Medicaid Other

## 2017-11-10 ENCOUNTER — Encounter: Payer: Self-pay | Admitting: Obstetrics and Gynecology

## 2017-11-10 ENCOUNTER — Other Ambulatory Visit: Payer: Self-pay

## 2017-11-10 DIAGNOSIS — O364XX0 Maternal care for intrauterine death, not applicable or unspecified: Secondary | ICD-10-CM

## 2017-11-10 DIAGNOSIS — O368120 Decreased fetal movements, second trimester, not applicable or unspecified: Secondary | ICD-10-CM | POA: Diagnosis not present

## 2017-11-10 DIAGNOSIS — Z3A26 26 weeks gestation of pregnancy: Secondary | ICD-10-CM | POA: Insufficient documentation

## 2017-11-10 DIAGNOSIS — O368190 Decreased fetal movements, unspecified trimester, not applicable or unspecified: Secondary | ICD-10-CM | POA: Diagnosis present

## 2017-11-10 NOTE — ED Triage Notes (Signed)
Triage Visit with NST   Cindy Hays is a 36 y.o.  [id number] at 26+1wks who presents with decreased fetal movement  Indication: Decreased fetal movement  S: Resting comfortably. no CTX, no VB.  - Patient last felt baby boy Junior move last night.  :  LMP  (LMP Unknown) Comment: irregular periods No results found for this or any previous visit (from the past 48 hour(s)).   Gen: NAD, AAOx3      Abd: FNTTP      Ext: Non-tender, Nonedmeatous    FHT: absent TOCO: quiet SVE: deferred  CLINICAL DATA:  Decreased fetal movement  EXAM: LIMITED OBSTETRIC ULTRASOUND  FINDINGS: Number of Fetuses: 1  Heart Rate:  No fetal heart tones detected  Movement: Absent  Presentation: Cephalic  Placental Location: Posterior  Previa: Absent  Amniotic Fluid (Subjective):  Within normal limits.  BPD:  5.3cm 22w  0d  MATERNAL FINDINGS:  Cervix:  Appears closed.  Uterus/Adnexae: No abnormality visualized.  IMPRESSION: Twenty-two week intrauterine pregnancy. No fetal heart tones detected. Findings consistent with fetal demise.   Electronically Signed   By: Rolm Baptise M.D.   On: 11/10/2017 10:05  A/P:  36 y.o.  G5P3104 at 26+1wks by 8wk ultrasound, diagnosed with fetal demise by confirming ultrasound today  - Options regarding management given to patient. She is afebrile with no s/s of infection. Decision to return home for christmas and come in for scheduled induction on Friday made with patient and FOB. Plan for cytotec 297mcg per vagina induction. -

## 2017-11-10 NOTE — OB Triage Note (Signed)
Pt given d/c instructions to return to hospital if flu-like symptom, fever or abdominal tenderness develops prior to scheduled induction for Friday 12/28 at 6AM.  Pt verbalizes understanding and has no additional questions/concerns at this time.

## 2017-11-10 NOTE — OB Triage Note (Signed)
Pt presents with c/o absent fetal mov't over the last 24 hours.  Pt denies abdominal pain, LOF, or VB.

## 2017-11-12 ENCOUNTER — Other Ambulatory Visit: Payer: Self-pay | Admitting: Obstetrics and Gynecology

## 2017-11-12 NOTE — Progress Notes (Unsigned)
OB ADMISSION/ HISTORY & PHYSICAL:  Admission Date: No admission date for patient encounter.  Admit Diagnosis:  Intrauterine fetal demise  Cindy Hays is a 36 y.o. female presenting for induction of labor for 26wk fetal demise.  Prenatal History: G5P4004 at 26+4   EDC : Not found.  Prenatal care at [facility]  Prenatal course complicated by  Hx of PreE with first pregnancy:   Baseline labs: normal on 10/22/17 - A1C: 5.8  P/C ratio: 192   CMP: Within normal limits  -  Obesity BMI 35.67 - early glucola not completed   Chronic anemia--Refer to Hematology Appt on 09/08/17 - did not make it to appointment- Pt has number to call and R/S  History of HSV   [place] multiparous patient: 4 prior NSVDs without complication (except for PreE). All 4 delivered with [place] ([place]).  Prenatal Labs: ABO, Rh:   A positive Antibody:   begative Rubella:   immune RPR:   non reactive HBsAg:    negative HIV:   negative GTT: unknown GBS:   unknown  Medical / Surgical History :  Past medical history:  Past Medical History:  Diagnosis Date  . Anemia      Past surgical history: No past surgical history on file.  Family History: No family history on file.   Social History:  reports that  has never smoked. she has never used smokeless tobacco. She reports that she does not drink alcohol or use drugs.   Allergies: Amoxicillin and Vicodin [hydrocodone-acetaminophen]    Current Medications at time of admission:  Prior to Admission medications   Medication Sig Start Date End Date Taking? Authorizing Provider  cefdinir (OMNICEF) 250 MG/5ML suspension Take 6 mLs (300 mg total) by mouth 2 (two) times daily. Patient not taking: Reported on 11/10/2017 06/28/17   Coral Spikes, DO  ferrous sulfate 220 (44 Fe) MG/5ML solution Take 220 mg by mouth daily.    [provider]  Prenatal Vit-Fe Fumarate-FA (MULTIVITAMIN-PRENATAL) 27-0.8 MG TABS tablet Take 1 tablet by mouth daily at 12  noon.    [provider]     Review of Systems: Depression   Physical Exam:  VS: There were no vitals taken for this visit.  General: alert and oriented, appears *** Heart: RRR Lungs: Clear lung fields Abdomen: Gravid, soft and non-tender, non-distended / uterus: *** Extremities: *** edema  Genitalia / VE:    TOCO: ***  Last [place] At anatomy scan, normal anatomy  Assessment: 36yo [id number] at 26+4 wks with fetal demise diagnosed on 11/10/17 by formal ultrasound  Plan:  1. Induction of labor: - 250mcg misoprostol vaginally q4 hrs  - toco monitoring only - vitals per unit protocol  2. Pain management:  - epidural if desired - iv stadol 1-2g q 1-2hrs PRN maternal preference. - iv toradol 15mg  q4hrs PRN maternal preference   3. Labs on admission: - CBC - KB - Parvovirus B-19 IgG and IgM - RPR - TSH - Lupus anticoagulant and anticardiolipin antibodies - Hgb A1C - CMP/P:C ratio  4. Pastoral care consult  - PRN maternal wishes  5. Plan for placental/fetal evaluation - fetal exam, photos and recommend autopsy if parental consent given - placenta to pathology - fetal karyotype ( 1x1cm chunk of placenta at base of cord. Will add additional information up to 30% of the time. Fill out paperwork and mail at room temp)  6. Ask about contraception plans 7. Offer "Now I lay me down to sleep" and the angel baby  group at [facility].

## 2017-11-13 ENCOUNTER — Other Ambulatory Visit: Payer: Self-pay

## 2017-11-13 ENCOUNTER — Inpatient Hospital Stay
Admission: EM | Admit: 2017-11-13 | Discharge: 2017-11-14 | DRG: 807 | Disposition: A | Discharge status: Home / Self Care | Payer: Medicaid Other | Attending: Obstetrics and Gynecology | Admitting: Obstetrics and Gynecology

## 2017-11-13 DIAGNOSIS — Z3A26 26 weeks gestation of pregnancy: Secondary | ICD-10-CM

## 2017-11-13 DIAGNOSIS — O364XX0 Maternal care for intrauterine death, not applicable or unspecified: Secondary | ICD-10-CM | POA: Diagnosis present

## 2017-11-13 DIAGNOSIS — O99012 Anemia complicating pregnancy, second trimester: Secondary | ICD-10-CM | POA: Diagnosis present

## 2017-11-13 LAB — COMPREHENSIVE METABOLIC PANEL
ALT: 12 U/L — AB (ref 14–54)
AST: 27 U/L (ref 15–41)
Albumin: 3.2 g/dL — ABNORMAL LOW (ref 3.5–5.0)
Alkaline Phosphatase: 79 U/L (ref 38–126)
Anion gap: 8 (ref 5–15)
BILIRUBIN TOTAL: 0.6 mg/dL (ref 0.3–1.2)
BUN: 6 mg/dL (ref 6–20)
CO2: 21 mmol/L — ABNORMAL LOW (ref 22–32)
CREATININE: 0.53 mg/dL (ref 0.44–1.00)
Calcium: 9.2 mg/dL (ref 8.9–10.3)
Chloride: 105 mmol/L (ref 101–111)
Glucose, Bld: 121 mg/dL — ABNORMAL HIGH (ref 65–99)
Potassium: 2.9 mmol/L — ABNORMAL LOW (ref 3.5–5.1)
Sodium: 134 mmol/L — ABNORMAL LOW (ref 135–145)
TOTAL PROTEIN: 7.6 g/dL (ref 6.5–8.1)

## 2017-11-13 LAB — CBC
HCT: 26.2 % — ABNORMAL LOW (ref 35.0–47.0)
HEMOGLOBIN: 8.3 g/dL — AB (ref 12.0–16.0)
MCH: 22.8 pg — AB (ref 26.0–34.0)
MCHC: 31.7 g/dL — AB (ref 32.0–36.0)
MCV: 72.1 fL — AB (ref 80.0–100.0)
Platelets: 265 10*3/uL (ref 150–440)
RBC: 3.63 MIL/uL — AB (ref 3.80–5.20)
RDW: 18.4 % — ABNORMAL HIGH (ref 11.5–14.5)
WBC: 7.3 10*3/uL (ref 3.6–11.0)

## 2017-11-13 LAB — HEMOGLOBIN A1C
HEMOGLOBIN A1C: 5.5 % (ref 4.8–5.6)
Mean Plasma Glucose: 111.15 mg/dL

## 2017-11-13 LAB — PROTEIN / CREATININE RATIO, URINE
CREATININE, URINE: 79 mg/dL
Protein Creatinine Ratio: 0.22 mg/mg{Cre} — ABNORMAL HIGH (ref 0.00–0.15)
Total Protein, Urine: 17 mg/dL

## 2017-11-13 LAB — TSH: TSH: 0.575 u[IU]/mL (ref 0.350–4.500)

## 2017-11-13 MED ORDER — BENZOCAINE-MENTHOL 20-0.5 % EX AERO: 1.0000 "application " | INHALATION_SPRAY | CUTANEOUS | Status: DC | PRN

## 2017-11-13 MED ORDER — OXYTOCIN 10 UNIT/ML IJ SOLN
INTRAMUSCULAR | Status: AC
  Filled 2017-11-13: qty 2

## 2017-11-13 MED ORDER — SODIUM CHLORIDE FLUSH 0.9 % IV SOLN
INTRAVENOUS | Status: AC
  Filled 2017-11-13: qty 10

## 2017-11-13 MED ORDER — OXYTOCIN BOLUS FROM INFUSION
500.0000 mL | Freq: Once | INTRAVENOUS | Status: AC
  Administered 2017-11-13: 500 mL via INTRAVENOUS

## 2017-11-13 MED ORDER — LACTATED RINGERS IV SOLN: 500.0000 mL | INTRAVENOUS | Status: DC | PRN

## 2017-11-13 MED ORDER — SODIUM CHLORIDE 0.9% FLUSH: 3.0000 mL | Freq: Two times a day (BID) | INTRAVENOUS | Status: DC

## 2017-11-13 MED ORDER — HYDROXYZINE HCL 50 MG PO TABS
50.0000 mg | ORAL_TABLET | Freq: Four times a day (QID) | ORAL | Status: DC | PRN
  Filled 2017-11-13: qty 1

## 2017-11-13 MED ORDER — LIDOCAINE HCL (PF) 1 % IJ SOLN: 30.0000 mL | INTRAMUSCULAR | Status: DC | PRN

## 2017-11-13 MED ORDER — WITCH HAZEL-GLYCERIN EX PADS: 1.0000 "application " | MEDICATED_PAD | CUTANEOUS | Status: DC | PRN

## 2017-11-13 MED ORDER — COCONUT OIL OIL
1.0000 | TOPICAL_OIL | Status: DC | PRN
Start: 2017-11-13 — End: 2017-11-14

## 2017-11-13 MED ORDER — ONDANSETRON HCL 4 MG/2ML IJ SOLN: 4.0000 mg | INTRAMUSCULAR | Status: DC | PRN

## 2017-11-13 MED ORDER — FLEET ENEMA 7-19 GM/118ML RE ENEM: 1.0000 | ENEMA | Freq: Every day | RECTAL | Status: DC | PRN

## 2017-11-13 MED ORDER — IBUPROFEN 600 MG PO TABS
ORAL_TABLET | ORAL | Status: AC
  Administered 2017-11-13: 600 mg via ORAL
  Filled 2017-11-13: qty 1

## 2017-11-13 MED ORDER — ACETAMINOPHEN 325 MG PO TABS
650.0000 mg | ORAL_TABLET | ORAL | Status: DC | PRN
  Administered 2017-11-13: 650 mg via ORAL
  Filled 2017-11-13: qty 2

## 2017-11-13 MED ORDER — MISOPROSTOL 200 MCG PO TABS
200.0000 ug | ORAL_TABLET | ORAL | Status: DC | PRN
  Administered 2017-11-13 (×2): 200 ug via VAGINAL
  Filled 2017-11-13 (×3): qty 1

## 2017-11-13 MED ORDER — MISOPROSTOL 200 MCG PO TABS
200.0000 ug | ORAL_TABLET | Freq: Once | ORAL | Status: AC
  Administered 2017-11-13: 200 ug via ORAL

## 2017-11-13 MED ORDER — PRENATAL MULTIVITAMIN CH: 1.0000 | ORAL_TABLET | Freq: Every day | ORAL | Status: DC

## 2017-11-13 MED ORDER — SIMETHICONE 80 MG PO CHEW: 80.0000 mg | CHEWABLE_TABLET | ORAL | Status: DC | PRN

## 2017-11-13 MED ORDER — ZOLPIDEM TARTRATE 5 MG PO TABS
5.0000 mg | ORAL_TABLET | Freq: Every evening | ORAL | Status: DC | PRN
  Administered 2017-11-14: 5 mg via ORAL

## 2017-11-13 MED ORDER — OXYCODONE HCL 5 MG PO TABS: 5.0000 mg | ORAL_TABLET | ORAL | Status: DC | PRN

## 2017-11-13 MED ORDER — LACTATED RINGERS IV SOLN
INTRAVENOUS | Status: DC
  Administered 2017-11-13 (×2): via INTRAVENOUS

## 2017-11-13 MED ORDER — DIBUCAINE 1 % RE OINT: 1.0000 "application " | TOPICAL_OINTMENT | RECTAL | Status: DC | PRN

## 2017-11-13 MED ORDER — SOD CITRATE-CITRIC ACID 500-334 MG/5ML PO SOLN: 30.0000 mL | ORAL | Status: DC | PRN

## 2017-11-13 MED ORDER — SODIUM CHLORIDE 0.9% FLUSH: 3.0000 mL | INTRAVENOUS | Status: DC | PRN

## 2017-11-13 MED ORDER — AMMONIA AROMATIC IN INHA
RESPIRATORY_TRACT | Status: AC
  Filled 2017-11-13: qty 10

## 2017-11-13 MED ORDER — DIPHENHYDRAMINE HCL 25 MG PO CAPS
25.0000 mg | ORAL_CAPSULE | Freq: Four times a day (QID) | ORAL | Status: DC | PRN
Start: 2017-11-13 — End: 2017-11-14

## 2017-11-13 MED ORDER — SENNOSIDES-DOCUSATE SODIUM 8.6-50 MG PO TABS
2.0000 | ORAL_TABLET | ORAL | Status: DC
  Filled 2017-11-13: qty 2

## 2017-11-13 MED ORDER — ONDANSETRON HCL 4 MG PO TABS: 4.0000 mg | ORAL_TABLET | ORAL | Status: DC | PRN

## 2017-11-13 MED ORDER — OXYTOCIN 40 UNITS IN LACTATED RINGERS INFUSION - SIMPLE MED
2.5000 [IU]/h | INTRAVENOUS | Status: DC
  Administered 2017-11-13: 2.5 [IU]/h via INTRAVENOUS
  Filled 2017-11-13: qty 1000

## 2017-11-13 MED ORDER — BUTORPHANOL TARTRATE 1 MG/ML IJ SOLN
1.0000 mg | INTRAMUSCULAR | Status: DC | PRN
  Administered 2017-11-13 (×5): 1 mg via INTRAVENOUS
  Filled 2017-11-13 (×5): qty 1

## 2017-11-13 MED ORDER — IBUPROFEN 600 MG PO TABS
600.0000 mg | ORAL_TABLET | Freq: Four times a day (QID) | ORAL | Status: DC
  Administered 2017-11-13: 600 mg via ORAL
  Filled 2017-11-13: qty 1

## 2017-11-13 MED ORDER — MISOPROSTOL 200 MCG PO TABS
ORAL_TABLET | ORAL | Status: AC
  Filled 2017-11-13: qty 4

## 2017-11-13 MED ORDER — BISACODYL 10 MG RE SUPP
10.0000 mg | Freq: Every day | RECTAL | Status: DC | PRN
  Filled 2017-11-13: qty 1

## 2017-11-13 MED ORDER — ONDANSETRON HCL 4 MG/2ML IJ SOLN: 4.0000 mg | Freq: Four times a day (QID) | INTRAMUSCULAR | Status: DC | PRN

## 2017-11-13 MED ORDER — LIDOCAINE HCL (PF) 1 % IJ SOLN
INTRAMUSCULAR | Status: AC
  Filled 2017-11-13: qty 30

## 2017-11-13 MED ORDER — SODIUM CHLORIDE 0.9 % IV SOLN: 250.0000 mL | INTRAVENOUS | Status: DC | PRN

## 2017-11-13 MED ORDER — ZOLPIDEM TARTRATE 5 MG PO TABS
5.0000 mg | ORAL_TABLET | Freq: Every evening | ORAL | Status: DC | PRN
  Filled 2017-11-13: qty 1

## 2017-11-13 NOTE — Progress Notes (Signed)
Labor Progress Note  Cindy Hays is a 36 y.o. [id number] admitted for induction of labor due to 26 week fetal demise.  Subjective: Pt resting comfortably in bed, partner resting at the bedside.   Objective: BP 114/65 (BP Location: Right Arm)   Pulse (!) 103   Temp 98.3 F (36.8 C) (Oral)   Resp 18   Ht 5\' 2"  (1.575 m)   Wt 89.8 kg (198 lb)   LMP  (LMP Unknown) Comment: irregular periods  BMI 36.21 kg/m   General:   alert, cooperative and no distress  Skin:  normal  Neurologic:    Alert & oriented x 3  Lungs:   clear to auscultation bilaterally  Heart:   regular rate and rhythm, S1, S2 normal, no murmur, click, rub or gallop  Abdomen:  soft, non-tender; bowel sounds normal; no masses,  no organomegaly  Extremities: : non-tender, symmetric, no edema bilaterally.   Labor Assessment: UC:   rare SVE:   By RN Charlott Holler at 0800 Dilation: 1cm  Effacement: 25%  Membrane status: intact Amniotic color: n/a  Labs: Lab Results  Component Value Date   WBC 7.3 11/13/2017   HGB 8.3 (L) 11/13/2017   HCT 26.2 (L) 11/13/2017   MCV 72.1 (L) 11/13/2017   PLT 265 11/13/2017    Assessment / Plan: IOL for 26w fetal demise, initial 288mcg vaginal misoprostol recently placed  Induction: Early induction, one dose of misoprostol total Pain Control:  Plans no epidural; reviewed options of IV pain medications and epidural if she changes her mind. She did not have an epidural with any of her other children, so we discussed process and potential side effects of getting an epidural.  Anticipated MOD:  vaginal delivery   Lisette Grinder, CNM 11/13/2017, 10:19 AM

## 2017-11-13 NOTE — Progress Notes (Signed)
Labor Progress Note  Cindy Hays is a 36 y.o. Y7W2956 admitted for induction of labor due to 26 week fetal demise.   Subjective: Pt reports leaking of fluid and starting to feel pressure.   Objective: BP 131/76 (BP Location: Right Arm)   Pulse 80   Temp 98.2 F (36.8 C) (Oral)   Resp 16   Ht 5\' 2"  (1.575 m)   Wt 89.8 kg (198 lb)   LMP  (LMP Unknown) Comment: irregular periods  BMI 36.21 kg/m   Labor Assessment: UC:   Pt reports contractions, RN reports that she is having a hard time placing toco in a position to pick up contractions.  SVE:  By RN around 1615 Dilation: 6cm  Membrane status: ruptured, slow leak, hourglassing in relation to cervix Amniotic color: clear  Labs: Lab Results  Component Value Date   WBC 7.3 11/13/2017   HGB 8.3 (L) 11/13/2017   HCT 26.2 (L) 11/13/2017   MCV 72.1 (L) 11/13/2017   PLT 265 11/13/2017    Assessment / Plan: IOL for 26w fetal demise, s/p 223mcg misoprostol PV x2 with additional 285mcg misoprostol buccal to be administered  Induction: Progressing normally Pain Control:  IV pain meds Anticipated MOD:  Vaginal delivery  Discussed patient's progress with Dr. Benjaman Kindler, who is in agreement with the above plan. MD will also be by soon to reassess fetal position, as baby was previously in a position not compatible with vaginal delivery.   Lisette Grinder, CNM 11/13/2017, 5:03 PM

## 2017-11-13 NOTE — H&P (Signed)
OB ADMISSION/ HISTORY & PHYSICAL:  Admission Date: 11/13/2017  6:33 AM  Admit Diagnosis:  Intrauterine fetal demise  Cindy Hays is a 36 y.o. female presenting for induction of labor for 26wk fetal demise.  Prenatal History: G5P4004 at 26+4   EDC : Not found.  Prenatal care at [facility]  Prenatal course complicated by  Hx of PreE with first pregnancy:   Baseline labs: normal on 10/22/17 - A1C: 5.8  P/C ratio: 192   CMP: Within normal limits  -  Obesity BMI 35.67 - early glucola not completed   Chronic anemia--Refer to Hematology Appt on 09/08/17 - did not make it to appointment- Pt has number to call and R/S  History of HSV   [place] multiparous patient: 4 prior NSVDs without complication (except for PreE). All 4 delivered with [place] ([place]).  Prenatal Labs: ABO, Rh: --/--/A POS (12/28 7564) A positive Antibody: POS (12/28 3329) begative Rubella:   immune RPR:   non reactive HBsAg:    negative HIV:   negative GTT: unknown GBS:   unknown  Medical / Surgical History :  Past medical history:  Past Medical History:  Diagnosis Date  . Anemia      Past surgical history: History reviewed. No pertinent surgical history.  Family History: No family history on file.   Social History:  reports that  has never smoked. she has never used smokeless tobacco. She reports that she does not drink alcohol or use drugs.   Allergies: Amoxicillin and Vicodin [hydrocodone-acetaminophen]    Current Medications at time of admission:  Prior to Admission medications   Medication Sig Start Date End Date Taking? Authorizing Provider  cefdinir (OMNICEF) 250 MG/5ML suspension Take 6 mLs (300 mg total) by mouth 2 (two) times daily. Patient not taking: Reported on 11/10/2017 06/28/17   Coral Spikes, DO  ferrous sulfate 220 (44 Fe) MG/5ML solution Take 220 mg by mouth daily.    [provider]  Prenatal Vit-Fe Fumarate-FA (MULTIVITAMIN-PRENATAL) 27-0.8 MG TABS tablet Take  1 tablet by mouth daily at 12 noon.    [provider]     Review of Systems: Depression   Physical Exam:  VS: Blood pressure 114/65, pulse (!) 103, temperature 98.3 F (36.8 C), temperature source Oral, resp. rate 18, height 5\' 2"  (1.575 m), weight 89.8 kg (198 lb).  General: alert and oriented, appears NAD Heart: RRR Lungs: Clear lung fields Abdomen: Gravid, soft and non-tender, non-distended / uterus: nontender Extremities: trace edema  Genitalia / VE: Dilation: 1 Effacement (%): 30, 40 Exam by:: Johnson RN  TOCO: flat  At anatomy scan, normal anatomy  Assessment: 36yo [id number] at 26+4 wks with fetal demise diagnosed on 11/10/17 by formal ultrasound  Plan:  1. Induction of labor: - 255mcg misoprostol vaginally q4 hrs  - toco monitoring only - vitals per unit protocol  2. Pain management:  - epidural if desired - iv stadol 1-2g q 1-2hrs PRN maternal preference. - iv toradol 15mg  q4hrs PRN maternal preference   3. Labs on admission: - CBC - KB - Parvovirus B-19 IgG and IgM - RPR - TSH - Lupus anticoagulant and anticardiolipin antibodies - Hgb A1C - CMP/P:C ratio  4. Pastoral care consult  - PRN maternal wishes  5. Plan for placental/fetal evaluation - fetal exam, photos and recommend autopsy if parental consent given - placenta to pathology - fetal karyotype ( 1x1cm chunk of placenta at base of cord. Will add additional information up to 30% of the time. Fill  out paperwork and mail at room temp)  6. Ask about contraception plans 7. Offer "Now I lay me down to sleep" and the angel baby group at [facility].

## 2017-11-13 NOTE — Discharge Summary (Addendum)
Obstetrical Discharge Summary  Patient Name: Cindy Hays DOB: 29-Nov-1980 MRN: [id number]  Date of Admission: 11/13/2017 Date of Discharge: 11/14/2017  Primary OB: [facility]  Gestational Age at Delivery: 26+4wks  Antepartum complications:  Hx of PreE with first pregnancy:   Baseline labs: normal on 10/22/17 - A1C: 5.8  P/C ratio: 192   CMP: Within normal limits  -  Obesity BMI 35.67 - early glucola not completed   Chronic anemia--Refer to Hematology Appt on 09/08/17 - did not make it to appointment- Pt has number to call and R/S  History of HSV   Cindy Hays multiparous patient: 4 prior NSVDs without complication (except for PreE). All 4 delivered with [place] ([place]).    Admitting Diagnosis: Fetal demise, not in labor Secondary Diagnosis: unknown etiology  Patient Active Problem List   Diagnosis Date Noted  . Fetal demise, greater than 22 weeks, antepartum, single gestation 11/13/2017  . Decreased fetal movement 11/10/2017    Augmentation: AROM and Cytotec Complications: Stillborn Intrapartum complications/course:  63YW [id number] at 26+4wks presenting for iol for fetal demise, dx by ultrasound at 26+1wks. 3 doses of 219mcg pv cytotec affected cervical change. AROM for clear fluid just before fetal delivery in cephalic position, simultaneously delivering placenta with minimal blood loss. Fundus firm, small clot in LUS extracted. No lacerations. No signs of life.  Evaluation of the fetus did not reveal any anatomic abnormalities, though skin sloughing and cord and feet edema were noted. His left eye was open and appeared normal. Hands, feet and extremities are normal. Spine straight, eyes symmetrical, ears not low set. He appears younger than 26 wks by about 2 weeks.  1x1cm placenta at base of cord collected and placed in room temp Hank's solution, and taken to the lab for chromosome collection. Placenta will be sent to pathology.  Patient tolerated the  procedures well physically and emotionally. Her sisters and the FOB were at the bedside throughout.  Cindy Hays, CNM, was present throughout.    Date of Delivery: 11/13/17 Delivered By: Cindy Hays Delivery Type: spontaneous vaginal delivery Anesthesia: none Placenta: Spontaneous Laceration: none Episiotomy: none Newborn Data: Live born female "66 DJ" Birth Weight:  475g APGAR: 0,0  Newborn Delivery   Birth date/time:  11/13/2017 18:15:00 Delivery type:  Vaginal, Spontaneous       Discharge Physical Exam:  BP 131/68   Pulse 88   Temp 98.3 F (36.8 C) (Oral)   Resp 16   Ht 5\' 2"  (1.575 m)   Wt 89.8 kg (198 lb)   LMP  (LMP Unknown) Comment: irregular periods  BMI 36.21 kg/m   General: NAD CV: RRR Pulm: CTABL, nl effort ABD: s/nd/nt, fundus firm and below the umbilicus Lochia: moderate DVT Evaluation: LE non-ttp, no evidence of DVT on exam.  Hemoglobin  Date Value Ref Range Status  11/14/2017 8.0 (L) 12.0 - 16.0 g/dL Final   HGB  Date Value Ref Range Status  07/03/2014 11.5 (L) 12.0 - 16.0 g/dL Final   HCT  Date Value Ref Range Status  11/14/2017 25.6 (L) 35.0 - 47.0 % Final  07/03/2014 34.7 (L) 35.0 - 47.0 % Final    Post partum course: None Postpartum Procedures: none Disposition: stable, discharge to home.  Rh Immune globulin given: n/a Rubella vaccine given: n/a  Contraception: desires BTL - MA-31 papers signed at time of delivery on 11/13/17  Prenatal Labs:  MBT A+ Ab screen Negative Pap HIV Negative Hep B/RPR Negative/Non reactive  Rubella Immune VZV Immune Hgb Elec: WNL  Iron deficiency anemia: Continue iron and vitamin C at home  Plan:  Cindy Hays was discharged to home in good condition. Follow-up appointment at [facility] in 2 weeks   Discharge Medications: Allergies as of 11/14/2017      Reactions   Amoxicillin Rash   Vicodin [hydrocodone-acetaminophen] Rash      Medication List    TAKE these  medications   cefdinir 250 MG/5ML suspension Commonly known as:  OMNICEF Take 6 mLs (300 mg total) by mouth 2 (two) times daily.   docusate sodium 100 MG capsule Commonly known as:  COLACE Take 1 capsule (100 mg total) by mouth 2 (two) times daily for 14 days. To keep stools soft, as needed   ferrous sulfate 220 (44 Fe) MG/5ML solution Take 220 mg by mouth daily.   ibuprofen 800 MG tablet Commonly known as:  ADVIL,MOTRIN Take 1 tablet (800 mg total) by mouth every 8 (eight) hours as needed for moderate pain or cramping.   multivitamin-prenatal 27-0.8 MG Tabs tablet Take 1 tablet by mouth daily at 12 noon.        Signed: Benjaman Hays

## 2017-11-14 LAB — LUPUS ANTICOAGULANT PANEL
DRVVT: 41.3 s (ref 0.0–47.0)
PTT Lupus Anticoagulant: 34.7 s (ref 0.0–51.9)

## 2017-11-14 LAB — CBC
HCT: 25.6 % — ABNORMAL LOW (ref 35.0–47.0)
Hemoglobin: 8 g/dL — ABNORMAL LOW (ref 12.0–16.0)
MCH: 22.6 pg — AB (ref 26.0–34.0)
MCHC: 31.1 g/dL — ABNORMAL LOW (ref 32.0–36.0)
MCV: 72.5 fL — ABNORMAL LOW (ref 80.0–100.0)
PLATELETS: 229 10*3/uL (ref 150–440)
RBC: 3.53 MIL/uL — AB (ref 3.80–5.20)
RDW: 18.9 % — ABNORMAL HIGH (ref 11.5–14.5)
WBC: 6.2 10*3/uL (ref 3.6–11.0)

## 2017-11-14 LAB — CARDIOLIPIN ANTIBODIES, IGM+IGG
Anticardiolipin IgG: <9 GPL U/mL (ref 0–14)
Anticardiolipin IgM: 11 MPL U/mL (ref 0–12)

## 2017-11-14 LAB — SYPHILIS: RPR W/REFLEX TO RPR TITER AND TREPONEMAL ANTIBODIES, TRADITIONAL SCREENING AND DIAGNOSIS ALGORITHM: RPR Ser Ql: NONREACTIVE

## 2017-11-14 MED ORDER — DOCUSATE SODIUM 100 MG PO CAPS: 100.0000 mg | ORAL_CAPSULE | Freq: Two times a day (BID) | ORAL | 0 refills | Status: AC

## 2017-11-14 MED ORDER — IBUPROFEN 800 MG PO TABS: 800.0000 mg | ORAL_TABLET | Freq: Three times a day (TID) | ORAL | 1 refills | Status: DC | PRN

## 2017-11-14 MED ORDER — ZOLPIDEM TARTRATE 5 MG PO TABS
5.0000 mg | ORAL_TABLET | Freq: Every evening | ORAL | Status: DC | PRN
Start: 2017-11-14 — End: 2017-11-14

## 2017-11-14 MED ORDER — ZOLPIDEM TARTRATE 5 MG PO TABS: 5.0000 mg | ORAL_TABLET | Freq: Every evening | ORAL | 0 refills | Status: DC | PRN

## 2017-11-14 NOTE — Discharge Summary (Signed)
Triage Visit with NST   Cindy Hays is a 36 y.o.  [id number] at 26+1wks who presents with decreased fetal movement  Indication: Decreased fetal movement  S: Resting comfortably. no CTX, no VB.  - Patient last felt baby boy Junior move last night.  :  LMP  (LMP Unknown) Comment: irregular periods No results found for this or any previous visit (from the past 48 hour(s)).   Gen: NAD, AAOx3                                           Abd: FNTTP                                                     Ext: Non-tender, Nonedmeatous                      FHT: absent TOCO: quiet SVE: deferred  CLINICAL DATA: Decreased fetal movement  EXAM: LIMITED OBSTETRIC ULTRASOUND  FINDINGS: Number of Fetuses: 1  Heart Rate: No fetal heart tones detected  Movement: Absent  Presentation: Cephalic  Placental Location: Posterior  Previa: Absent  Amniotic Fluid (Subjective): Within normal limits.  BPD: 5.3cm 22w 0d  MATERNAL FINDINGS:  Cervix: Appears closed.  Uterus/Adnexae: No abnormality visualized.  IMPRESSION: Twenty-two week intrauterine pregnancy. No fetal heart tones detected. Findings consistent with fetal demise.   Electronically Signed By: Rolm Baptise M.D. On: 11/10/2017 10:05  A/P:  36 y.o.  [id number] at 26+1wks by 8wk ultrasound, diagnosed with fetal demise by confirming ultrasound today  - Options regarding management given to patient. She is afebrile with no s/s of infection. Decision to return home for christmas and come in for scheduled induction on Friday made with patient and FOB. Plan for cytotec 285mcg per vagina induction.

## 2017-11-14 NOTE — Progress Notes (Signed)
Patient discharged to home after discharge instructions gone over and patient verbalized understanding. Patient desired to walk, rn walked with patient to exit.

## 2017-11-15 LAB — TYPE AND SCREEN
ABO/RH(D): A POS
Antibody Screen: POSITIVE
UNIT DIVISION: 0
UNIT DIVISION: 0

## 2017-11-15 LAB — BPAM RBC
Blood Product Expiration Date: 201901082359
Blood Product Expiration Date: 201901252359
UNIT TYPE AND RH: 6200
Unit Type and Rh: 6200

## 2017-11-16 LAB — PARVOVIRUS B19 ANTIBODY, IGG AND IGM
Parovirus B19 IgG Abs: 3.1 index — ABNORMAL HIGH (ref 0.0–0.8)
Parovirus B19 IgM Abs: 0.1 index (ref 0.0–0.8)

## 2017-11-17 DIAGNOSIS — I10 Essential (primary) hypertension: Secondary | ICD-10-CM | POA: Insufficient documentation

## 2017-11-17 NOTE — L&D Delivery Note (Signed)
Delivery Note Called to see patient due to fetal decels. Pt had tachysystole starting at 0511.  FHR in the 60's, SVE by RN 6/100/-1.  Oxytocin discontinued. FHR in 120's with recurrent variables. Terbutaline ordered and brought to bedside. RN prepared to give terbutaline when Pt reported urge to push, in right lateral position. Vertex palpable on perineum.  SVD of viable baby boy.  Vertex, anterior and posterior shoulder followed by body at 0525. No nuchal cord noted. NICU RN in room for delivery.  NNP called stat to delivery room.  Respiratory notified of delivery. Infant with spontaneous cry and good tone.  Cord clamped x 2 and infant handed to NICU RN for further assessment on warmer.  Spontaneous delivery of intact placenta, schultz side at 0532.  Multiple calcification noted on placenta. Central cord insertion with 3VC.  Sent to pathology for evaluation.  Sponge and needle count correct.  Hemostasis achieved, uterus firm, below U. EBL 57ml.   APGAR: 8/9  weight 3 lb 9.1 oz (1620 g).   Placenta status: intact, delivered, to pathology  Cord: 3VC   Anesthesia:  None Episiotomy:  None Lacerations:  None Suture Repair: N/A Est. Blood Loss (mL): 31ml   Mom to remain in birth place for postpartum recovery due to Mag Sulfate for 24 hours.  Baby to NICU.  ________________________________________________________  Danford Bad, MSN, CNM, FNP Certified Nurse Midwife [facility]

## 2017-11-19 LAB — SURGICAL PATHOLOGY

## 2017-12-30 ENCOUNTER — Ambulatory Visit
Admission: EM | Admit: 2017-12-30 | Discharge: 2017-12-30 | Disposition: A | Discharge status: Home / Self Care | Payer: Medicaid Other | Attending: Family Medicine | Admitting: Family Medicine

## 2017-12-30 ENCOUNTER — Encounter: Payer: Self-pay | Admitting: Emergency Medicine

## 2017-12-30 ENCOUNTER — Other Ambulatory Visit: Payer: Self-pay

## 2017-12-30 DIAGNOSIS — B9789 Other viral agents as the cause of diseases classified elsewhere: Secondary | ICD-10-CM

## 2017-12-30 DIAGNOSIS — R05 Cough: Secondary | ICD-10-CM

## 2017-12-30 DIAGNOSIS — J0100 Acute maxillary sinusitis, unspecified: Secondary | ICD-10-CM

## 2017-12-30 DIAGNOSIS — J069 Acute upper respiratory infection, unspecified: Secondary | ICD-10-CM | POA: Diagnosis not present

## 2017-12-30 MED ORDER — ALBUTEROL SULFATE HFA 108 (90 BASE) MCG/ACT IN AERS: 2.0000 | INHALATION_SPRAY | RESPIRATORY_TRACT | 0 refills | Status: DC | PRN

## 2017-12-30 MED ORDER — BENZONATATE 100 MG PO CAPS: 100.0000 mg | ORAL_CAPSULE | Freq: Three times a day (TID) | ORAL | 0 refills | Status: DC | PRN

## 2017-12-30 MED ORDER — AZITHROMYCIN 250 MG PO TABS: ORAL_TABLET | ORAL | 0 refills | Status: DC

## 2017-12-30 NOTE — ED Provider Notes (Signed)
[facility] ____________________________________________  Time seen: Approximately 10:26 AM  I have reviewed the triage vital signs and the nursing notes.   HISTORY  Chief Complaint Cough   HPI Cindy Hays is a 37 y.o. female presented for evaluation of just over 1 week of nasal congestion, runny nose, cough.  Patient reports over the last few days she feels that the cough has somewhat increased and has intermittent tightness in her chest associated with the cough.  States that she feels the tightness more so when she is being very active, as well as with her coughing at night.  Denies any actual shortness of breath or chest pain.  No hemoptysis.  Has continued to remain active and working.  States earlier this week she was having some chills and body aches, no known fevers.  Has occasionally taken some Triaminic with slight improvement, no resolution.  No antipyretics taken prior to arrival.  Continues to eat and drink well.  Denies sore throat.  States occasional sinus pressure sensation.  States she has several kids and they have been intermittently sick with some similar complaints. Denies chest pain, shortness of breath, abdominal pain, dysuria, extremity pain, extremity swelling or rash. Denies recent sickness. Denies recent antibiotic use.   Soles, Howell Rucks, MD: PCP Patient's last menstrual period was 12/09/2017 (approximate).  Denies pregnancy.  Patient states that she had a stillborn in December of this past year.   Past Medical History:  Diagnosis Date  . Anemia     Patient Active Problem List   Diagnosis Date Noted  . Fetal demise, greater than 22 weeks, antepartum, single gestation 11/13/2017  . Decreased fetal movement 11/10/2017    History reviewed. No pertinent surgical history.   No current facility-administered medications for this encounter.   Current Outpatient Medications:  .  ferrous sulfate 220 (44 Fe) MG/5ML solution, Take 220 mg by  mouth daily., Disp: , Rfl:  .  albuterol (PROVENTIL HFA;VENTOLIN HFA) 108 (90 Base) MCG/ACT inhaler, Inhale 2 puffs into the lungs every 4 (four) hours as needed for wheezing., Disp: 1 Inhaler, Rfl: 0 .  azithromycin (ZITHROMAX Z-PAK) 250 MG tablet, Take 2 tablets (500 mg) on  Day 1,  followed by 1 tablet (250 mg) once daily on Days 2 through 5., Disp: 6 each, Rfl: 0 .  benzonatate (TESSALON PERLES) 100 MG capsule, Take 1 capsule (100 mg total) by mouth 3 (three) times daily as needed for cough., Disp: 15 capsule, Rfl: 0  Allergies Amoxicillin and Vicodin [hydrocodone-acetaminophen]  Family History  Problem Relation Age of Onset  . Hypertension Mother     Social History Social History   Tobacco Use  . Smoking status: Never Smoker  . Smokeless tobacco: Never Used  Substance Use Topics  . Alcohol use: No  . Drug use: No    Review of Systems Constitutional: No fever/chills ENT: No sore throat. Cardiovascular: Denies chest pain. Respiratory: Denies shortness of breath. As above.  Gastrointestinal: No abdominal pain.  No nausea, no vomiting.   Genitourinary: Negative for dysuria. Musculoskeletal: Negative for back pain. Skin: Negative for rash.  ____________________________________________   PHYSICAL EXAM:  VITAL SIGNS: ED Triage Vitals [12/30/17 0958]  Enc Vitals Group     BP (!) 148/89     Pulse Rate 90     Resp 16     Temp 97.8 F (36.6 C)     Temp Source Oral     SpO2 100 %     Weight 189 lb (85.7  kg)     Height 5\' 2"  (1.575 m)     Head Circumference      Peak Flow      Pain Score 7     Pain Loc      Pain Edu?      Excl. in [place]?     Constitutional: Alert and oriented. Well appearing and in no acute distress. Eyes: Conjunctivae are normal.  Head: Atraumatic.Mild tenderness to palpation bilateral maxillary sinuses.  No frontal sinus tenderness palpation.  No swelling. No erythema.   Ears: no erythema, normal TMs bilaterally.   Nose: nasal congestion with  bilateral nasal turbinate erythema and edema.   Mouth/Throat: Mucous membranes are moist.  Oropharynx non-erythematous.No tonsillar swelling or exudate.  Neck: No stridor.  No cervical spine tenderness to palpation. Hematological/Lymphatic/Immunilogical: No cervical lymphadenopathy. Cardiovascular: Normal rate, regular rhythm. Grossly normal heart sounds.  Good peripheral circulation. Respiratory: Normal respiratory effort.  No retractions. No wheezes, rales or rhonchi. Good air movement.  Occasional dry cough noted with slight bronchospasm.  Speaks in complete sentences. Gastrointestinal: Soft and nontender.  Musculoskeletal: Bilateral lower extremities nontender no edema noted.  No cervical, thoracic or lumbar tenderness to palpation.  Neurologic:  Normal speech and language. No gait instability. Skin:  Skin is warm, dry and intact. No rash noted. Psychiatric: Mood and affect are normal. Speech and behavior are normal.  ___________________________________________   LABS (all labs ordered are listed, but only abnormal results are displayed)  Labs Reviewed - No data to display   PROCEDURES Procedures    INITIAL IMPRESSION / ASSESSMENT AND PLAN / ED COURSE  Pertinent labs & imaging results that were available during my care of the patient were reviewed by me and considered in my medical decision making (see chart for details).  Well-appearing patient.  No acute distress.  Suspect recent viral upper respiratory infection.  Patient appears well.  Patient does have slight bronchospasm with cough, otherwise lungs clear throughout.  Concern for secondary sinusitis.  Discussed will defer chest x-ray at this time, patient agrees.  Encourage supportive care.  Will give albuterol inhaler and PRN Tessalon Perles.  Discussed utilization of supportive care for the next 2-3 days, and if symptoms persisting, then start oral azithromycin Rx given.  Discussed reevaluation for any worsening  concerns.Discussed indication, risks and benefits of medications with patient.  Discussed follow up with Primary care physician this week. Discussed follow up and return parameters including no resolution or any worsening concerns. Patient verbalized understanding and agreed to plan.   ____________________________________________   FINAL CLINICAL IMPRESSION(S) / ED DIAGNOSES  Final diagnoses:  Viral URI with cough  Acute maxillary sinusitis, recurrence not specified     ED Discharge Orders        Ordered    azithromycin (ZITHROMAX Z-PAK) 250 MG tablet     12/30/17 1029    albuterol (PROVENTIL HFA;VENTOLIN HFA) 108 (90 Base) MCG/ACT inhaler  Every 4 hours PRN     12/30/17 1029    benzonatate (TESSALON PERLES) 100 MG capsule  3 times daily PRN     12/30/17 1029       Note: This dictation was prepared with Dragon dictation along with smaller phrase technology. Any transcriptional errors that result from this process are unintentional.         Marylene Land, NP 12/30/17 1046

## 2017-12-30 NOTE — ED Triage Notes (Signed)
Patient in today c/o 1 week of cough, sometimes productive (clear) and sob with exertion. Patient denies fever, but has had chills. Has not taken temperature.

## 2017-12-30 NOTE — Discharge Instructions (Signed)
Take medication as prescribed. Rest. Drink plenty of fluids.  ° °Follow up with your primary care physician this week as needed. Return to Urgent care for new or worsening concerns.  ° °

## 2018-02-25 ENCOUNTER — Other Ambulatory Visit: Payer: Self-pay | Admitting: Obstetrics and Gynecology

## 2018-02-25 DIAGNOSIS — Z369 Encounter for antenatal screening, unspecified: Secondary | ICD-10-CM

## 2018-02-25 LAB — OB RESULTS CONSOLE GC/CHLAMYDIA
CHLAMYDIA, DNA PROBE: NEGATIVE
GC PROBE AMP, GENITAL: NEGATIVE

## 2018-03-18 ENCOUNTER — Ambulatory Visit
Admission: RE | Admit: 2018-03-18 | Discharge: 2018-03-18 | Disposition: A | Discharge status: Home / Self Care | Payer: Medicaid Other | Source: Ambulatory Visit | Attending: Obstetrics and Gynecology | Admitting: Obstetrics and Gynecology

## 2018-03-18 ENCOUNTER — Ambulatory Visit (HOSPITAL_BASED_OUTPATIENT_CLINIC_OR_DEPARTMENT_OTHER)
Admission: RE | Admit: 2018-03-18 | Discharge: 2018-03-18 | Disposition: A | Discharge status: Home / Self Care | Payer: Medicaid Other | Source: Ambulatory Visit | Attending: Obstetrics and Gynecology | Admitting: Obstetrics and Gynecology

## 2018-03-18 VITALS — BP 119/80 | HR 87 | Temp 98.7°F | Resp 18 | Ht 62.4 in | Wt 193.0 lb

## 2018-03-18 DIAGNOSIS — Z3689 Encounter for other specified antenatal screening: Secondary | ICD-10-CM | POA: Diagnosis not present

## 2018-03-18 DIAGNOSIS — O09521 Supervision of elderly multigravida, first trimester: Secondary | ICD-10-CM

## 2018-03-18 DIAGNOSIS — Z369 Encounter for antenatal screening, unspecified: Secondary | ICD-10-CM

## 2018-03-18 DIAGNOSIS — Z3686 Encounter for antenatal screening for cervical length: Secondary | ICD-10-CM | POA: Insufficient documentation

## 2018-03-18 DIAGNOSIS — O364XX0 Maternal care for intrauterine death, not applicable or unspecified: Secondary | ICD-10-CM

## 2018-03-18 DIAGNOSIS — O09891 Supervision of other high risk pregnancies, first trimester: Secondary | ICD-10-CM

## 2018-03-18 NOTE — Progress Notes (Addendum)
Referring Provider:  Hassan Buckler Length of Consultation: 40 minutes  Cindy Hays was referred to [facility] for genetic counseling because of advanced maternal age.  The patient will be 37 years old at the time of delivery.  This note summarizes the information we discussed.    We explained that the chance of a chromosome abnormality increases with maternal age.  Chromosomes and examples of chromosome problems were reviewed.  Humans typically have 46 chromosomes in each cell, with half passed through each sperm and egg.  Any change in the number or structure of chromosomes can increase the risk of problems in the physical and mental development of a pregnancy.   Based upon age of the patient, the chance of any chromosome abnormality was 1 in 57. The chance of Down syndrome, the most common chromosome problem associated with maternal age, was 1 in 7.  The risk of chromosome problems is in addition to the 3% general population risk for birth defects and mental retardation.  The greatest chance, of course, is that the baby would be born in good health.  We discussed the following prenatal screening and testing options for this pregnancy:  First trimester screening, which includes nuchal translucency ultrasound screen and first trimester maternal serum marker screening.  The nuchal translucency has approximately an 80% detection rate for Down syndrome and can be positive for other chromosome abnormalities as well as heart defects.  When combined with a maternal serum marker screening, the detection rate is up to 90% for Down syndrome and up to 97% for trisomy 18.     The chorionic villus sampling procedure is available for first trimester chromosome analysis.  This involves the withdrawal of a small amount of chorionic villi (tissue from the developing placenta).  Risk of pregnancy loss is estimated to be approximately 1 in 200 to 1 in 100 (0.5 to 1%).  There is  approximately a 1% (1 in 100) chance that the CVS chromosome results will be unclear.  Chorionic villi cannot be tested for neural tube defects.     Maternal serum marker screening, a blood test that measures pregnancy proteins, can provide risk assessments for Down syndrome, trisomy 18, and open neural tube defects (spina bifida, anencephaly). Because it does not directly examine the fetus, it cannot positively diagnose or rule out these problems.  Targeted ultrasound uses high frequency sound waves to create an image of the developing fetus.  An ultrasound is often recommended as a routine means of evaluating the pregnancy.  It is also used to screen for fetal anatomy problems (for example, a heart defect) that might be suggestive of a chromosomal or other abnormality.   Amniocentesis involves the removal of a small amount of amniotic fluid from the sac surrounding the fetus with the use of a thin needle inserted through the maternal abdomen and uterus.  Ultrasound guidance is used throughout the procedure.  Fetal cells from amniotic fluid are directly evaluated and > 99.5% of chromosome problems and > 98% of open neural tube defects can be detected. This procedure is generally performed after the 15th week of pregnancy.  The main risks to this procedure include complications leading to miscarriage in less than 1 in 200 cases (0.5%).  We also reviewed the availability of cell free fetal DNA testing from maternal blood to determine whether or not the baby may have either Down syndrome, trisomy 6, or trisomy 60.  This test utilizes a maternal blood sample and DNA sequencing  technology to isolate circulating cell free fetal DNA from maternal plasma.  The fetal DNA can then be analyzed for DNA sequences that are derived from the three most common chromosomes involved in aneuploidy, chromosomes 13, 18, and 21.  If the overall amount of DNA is greater than the expected level for any of these chromosomes,  aneuploidy is suspected.  While we do not consider it a replacement for invasive testing and karyotype analysis, a negative result from this testing would be reassuring, though not a guarantee of a normal chromosome complement for the baby.  An abnormal result is certainly suggestive of an abnormal chromosome complement, though we would still recommend CVS or amniocentesis to confirm any findings from this testing.  Cystic Fibrosis and Spinal Muscular Atrophy (SMA) screening were also discussed with the patient. Both conditions are recessive, which means that both parents must be carriers in order to have a child with the disease.  Cystic fibrosis (CF) is one of the most common genetic conditions in persons of Caucasian ancestry.  This condition occurs in approximately 1 in 2,500 Caucasian persons and results in thickened secretions in the lungs, digestive, and reproductive systems.  For a baby to be at risk for having CF, both of the parents must be carriers for this condition.  Approximately 1 in 56 Caucasian persons is a carrier for CF.  Current carrier testing looks for the most common mutations in the gene for CF and can detect approximately 90% of carriers in the Caucasian population.  This means that the carrier screening can greatly reduce, but cannot eliminate, the chance for an individual to have a child with CF.  If an individual is found to be a carrier for CF, then carrier testing would be available for the partner. As part of [place] newborn screening profile, all babies born in the state of [place] will have a two-tier screening process.  Specimens are first tested to determine the concentration of immunoreactive trypsinogen (IRT).  The top 5% of specimens with the highest IRT values then undergo DNA testing using a panel of over 40 common CF mutations. SMA is a neurodegenerative disorder that leads to atrophy of skeletal muscle and overall weakness.  This condition is also more  prevalent in the Caucasian population, with 1 in 40-1 in 60 persons being a carrier and 1 in 6,000-1 in 10,000 children being affected.  There are multiple forms of the disease, with some causing death in infancy to other forms with survival into adulthood.  The genetics of SMA is complex, but carrier screening can detect up to 95% of carriers in the Caucasian population.  Similar to CF, a negative result can greatly reduce, but cannot eliminate, the chance to have a child with SMA.  We obtained a detailed family history and pregnancy history.  This is the sixth pregnancy for Cindy Hays.  She has four healthy children, ages 59, 9, 93 and 65 years, from prior relationships.  With her current partner, she had a stillborn son in December 2018.  She was [redacted] weeks gestation and felt no fetal movement, then learned that the baby had passed away.  Upon delivery, the placental was noted to be small, as was the baby, but no known etiology was identified for the loss.  No autopsy or chromosome analysis was performed.  Testing for lupus anticoagulant and antiphospholipid antibodies as well as RPR were normal.  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  Dr. Lehman Prom recommended today to include Anti beta  2 glycoprotein testing and for the patient to take a baby aspirin each day for the remainder of the pregnancy. The remainder of the family history is unremarkable for birth defects, developmental delays, recurrent pregnancy loss or known chromosome abnormalities.  Cindy Hays reported no complications or exposures in this pregnancy other than hypertension and low iron.  She was previously tested for hemoglobinopathies and found to have normal AA hemoglobin.  .  After consideration of the options, Cindy Hays elected to proceed with MaterniT21 PLUS with SCA.  She declined CF and SMA carrier screening.  An ultrasound was performed at the time of the visit.  The gestational age was consistent with 8 weeks.  Fetal anatomy could  not be assessed due to early gestational age.  Please refer to the ultrasound report for details of that study.  Cindy Hays was encouraged to call with questions or concerns.  We can be contacted at [phone].   Tests Ordered:  MaterniT21 PLUS with SCA, anti beta 2 glycoprotein   Wilburt Finlay, MS, CGC  I agree with testing plan as outlined  Gatha Mayer, MD

## 2018-03-19 LAB — OB RESULTS CONSOLE RUBELLA ANTIBODY, IGM: RUBELLA: IMMUNE

## 2018-03-20 LAB — BETA-2-GLYCOPROTEIN I ABS, IGG/M/A
Beta-2 Glyco I IgG: <9 GPI IgG units (ref 0–20)
Beta-2-Glycoprotein I IgM: <9 GPI IgM units (ref 0–32)

## 2018-03-22 LAB — MATERNIT21 PLUS CORE+SCA
CHROMOSOME 13: NEGATIVE
CHROMOSOME 18: NEGATIVE
CHROMOSOME 21: NEGATIVE
Y Chromosome: DETECTED

## 2018-03-25 ENCOUNTER — Telehealth: Payer: Self-pay | Admitting: Obstetrics and Gynecology

## 2018-03-25 NOTE — Telephone Encounter (Signed)
The patient was informed of the results of her recent MaterniT21 testing which yielded NEGATIVE results.  The patient's specimen showed DNA consistent with two copies of chromosomes 21, 18 and 13.  The sensitivity for trisomy 64, trisomy 73 and trisomy 46 using this testing are reported as 99.1%, 99.9% and 91.7% respectively.  Thus, while the results of this testing are highly accurate, they are not considered diagnostic at this time.  Should more definitive information be desired, the patient may still consider amniocentesis.   As requested to know by the patient, sex chromosome analysis was included for this sample.  Results are consistent with a female fetus. This is predicted with >99% accuracy.  A maternal serum AFP only should be considered if screening for neural tube defects is desired.  In addition, the patient was informed that the results of the beta 2 glycoprotein testing were also normal.  This was ordered in follow up to her stillborn baby in her last pregnancy, as abnormal results on this testing can be associated with poor pregnancy outcomes.  No additional follow up is needed for this result.  We may be reached at [phone] with any questions or concerns.   Wilburt Finlay, MS, CGC

## 2018-03-26 LAB — OB RESULTS CONSOLE HEPATITIS B SURFACE ANTIGEN: Hepatitis B Surface Ag: NEGATIVE

## 2018-03-26 LAB — OB RESULTS CONSOLE VARICELLA ZOSTER ANTIBODY, IGG: VARICELLA IGG: IMMUNE

## 2018-03-26 LAB — OB RESULTS CONSOLE HIV ANTIBODY (ROUTINE TESTING): HIV: NONREACTIVE

## 2018-04-05 ENCOUNTER — Other Ambulatory Visit: Payer: Self-pay | Admitting: *Deleted

## 2018-04-05 DIAGNOSIS — O09521 Supervision of elderly multigravida, first trimester: Secondary | ICD-10-CM

## 2018-04-07 ENCOUNTER — Emergency Department
Admission: EM | Admit: 2018-04-07 | Discharge: 2018-04-08 | Disposition: A | Discharge status: Home / Self Care | Payer: Medicaid Other | Attending: Emergency Medicine | Admitting: Emergency Medicine

## 2018-04-07 ENCOUNTER — Other Ambulatory Visit: Payer: Self-pay

## 2018-04-07 ENCOUNTER — Encounter: Payer: Self-pay | Admitting: Emergency Medicine

## 2018-04-07 DIAGNOSIS — B029 Zoster without complications: Secondary | ICD-10-CM | POA: Diagnosis not present

## 2018-04-07 DIAGNOSIS — Z79899 Other long term (current) drug therapy: Secondary | ICD-10-CM | POA: Insufficient documentation

## 2018-04-07 DIAGNOSIS — R21 Rash and other nonspecific skin eruption: Secondary | ICD-10-CM | POA: Diagnosis present

## 2018-04-07 MED ORDER — ACYCLOVIR 200 MG PO CAPS
800.0000 mg | ORAL_CAPSULE | Freq: Once | ORAL | Status: AC
Start: 2018-04-07 — End: 2018-04-07
  Administered 2018-04-07: 800 mg via ORAL
  Filled 2018-04-07: qty 4

## 2018-04-07 MED ORDER — ACYCLOVIR 800 MG PO TABS: 800.0000 mg | ORAL_TABLET | Freq: Every day | ORAL | 0 refills | Status: DC

## 2018-04-07 MED ORDER — LIDOCAINE 4 % EX CREA
TOPICAL_CREAM | Freq: Once | CUTANEOUS | Status: AC
  Administered 2018-04-07: via TOPICAL
  Filled 2018-04-07: qty 5

## 2018-04-07 NOTE — ED Triage Notes (Signed)
Patient to ER for c/o rash to right back (close to axillary region). Patient has vesicular rash present. Patient states rash feels like it's on fire. Patient is currently [redacted] weeks pregnant. Patient states she has tried using cortisone cream, but has had no relief. Has two smaller areas with single lesions, but all lesions are on right side of body. States rash started over weekend (approx 4 days ago).

## 2018-04-07 NOTE — Discharge Instructions (Signed)
You have a rash presentation that is consistent with shingles (herpes zoster). Take the prescription anti-viral as directed. Keep the rash clean, dry, and covered with a non-stick dressing and topical pain relief cream. Follow-up with your OB provider for ongoing management.

## 2018-04-07 NOTE — ED Notes (Signed)
Pt has rash to R mid back near axilla.  Area has open scabs and pt is very sensitive to the touch.  Pt is A&Ox4, in NAD.

## 2018-04-08 NOTE — ED Notes (Signed)
Pt discharged to home.  Family member driving.  Discharge instructions reviewed.  Verbalized understanding.  No questions or concerns at this time.  Teach back verified.  Pt in NAD.  No items left in ED.   

## 2018-04-08 NOTE — ED Provider Notes (Signed)
[facility] Emergency Department Provider Note ____________________________________________  Time seen: 2320  I have reviewed the triage vital signs and the nursing notes.  HISTORY  Chief Complaint  Rash  HPI Cindy Hays is a 37 y.o. female presents herself to the ED for evaluation of a painful rash with onset less than 72 hours prior to arrival.  Patient is unclear of her history of either active varicella or varicella vaccine.  She does admit to the onset of a painful rash to her right posterior axilla, about 2-1/2 days prior.  She reports the area began as a single painful blister, and now she notes a area of grouped blisters.  She also has some new lesions developing along the mid back.  She denies any fevers, chills, or sweats.  She also denies any sick contacts, recent exposures.  She is approximately [redacted] weeks pregnant with a single IUP.  Past Medical History:  Diagnosis Date  . Anemia     Patient Active Problem List   Diagnosis Date Noted  . Short interval between pregnancies affecting pregnancy in first trimester, antepartum 03/18/2018  . Advanced maternal age in multigravida, first trimester 03/18/2018  . Fetal demise, greater than 22 weeks, antepartum, single gestation 11/13/2017    History reviewed. No pertinent surgical history.  Prior to Admission medications   Medication Sig Start Date End Date Taking? Authorizing Provider  acyclovir (ZOVIRAX) 800 MG tablet Take 1 tablet (800 mg total) by mouth 5 (five) times daily. 04/07/18   Evangelia Whitaker, Dannielle Karvonen, PA-C  albuterol (PROVENTIL HFA;VENTOLIN HFA) 108 (90 Base) MCG/ACT inhaler Inhale 2 puffs into the lungs every 4 (four) hours as needed for wheezing. Patient not taking: Reported on 03/18/2018 12/30/17   Marylene Land, NP  ferrous sulfate 220 (44 Fe) MG/5ML solution Take 220 mg by mouth daily.    [provider]  Prenatal Vit-Fe Fumarate-FA (PRENATAL MULTIVITAMIN) TABS tablet Take 1  tablet by mouth daily at 12 noon.    [provider]    Allergies Amoxicillin and Vicodin [hydrocodone-acetaminophen]  Family History  Problem Relation Age of Onset  . Hypertension Mother     Social History Social History   Tobacco Use  . Smoking status: Never Smoker  . Smokeless tobacco: Never Used  Substance Use Topics  . Alcohol use: No  . Drug use: No    Review of Systems  Constitutional: Negative for fever. Eyes: Negative for visual changes. ENT: Negative for sore throat. Cardiovascular: Negative for chest pain. Respiratory: Negative for shortness of breath. Gastrointestinal: Negative for abdominal pain, vomiting and diarrhea. Genitourinary: Negative for dysuria. Musculoskeletal: Negative for back pain. Skin: Positive for rash. Neurological: Negative for headaches, focal weakness or numbness. ____________________________________________  PHYSICAL EXAM:  VITAL SIGNS: ED Triage Vitals  Enc Vitals Group     BP 04/07/18 2240 (!) 125/47     Pulse Rate 04/07/18 2240 92     Resp 04/07/18 2240 18     Temp 04/07/18 2240 98.8 F (37.1 C)     Temp Source 04/07/18 2240 Oral     SpO2 04/07/18 2240 99 %     Weight 04/07/18 2240 198 lb (89.8 kg)     Height 04/07/18 2240 5\' 2"  (1.575 m)     Head Circumference --      Peak Flow --      Pain Score 04/07/18 2249 8     Pain Loc --      Pain Edu? --  Excl. in [place]? --     Constitutional: Alert and oriented. Well appearing and in no distress. Head: Normocephalic and atraumatic. Cardiovascular: Normal rate, regular rhythm.  Respiratory: Normal respiratory effort.  Musculoskeletal: Nontender with normal range of motion in all extremities.  Neurologic:  Normal gait without ataxia. Normal speech and language. No gross focal neurologic deficits are appreciated. Skin:  Skin is warm, dry and intact.  Patient with a collection of painful intact blisters grouped on the right thorax.  Lesions appear to be in a single  dermatomal distribution. ____________________________________________  PROCEDURES  Procedures Acyclovir 800 mg PO Lidocaine 4% cream Telfa Dressing ____________________________________________  INITIAL IMPRESSION / ASSESSMENT AND PLAN / ED COURSE  Patient with ED evaluation of sudden onset of a painful blistery rash consistent with shingles.  Patient is [redacted] weeks gestational age, and treated with antivirals.  She will follow with her OB provider for ongoing management.  A work note is provided for 2 days as requested. ____________________________________________  FINAL CLINICAL IMPRESSION(S) / ED DIAGNOSES  Final diagnoses:  Herpes zoster without complication      Carmie End, Dannielle Karvonen, PA-C 04/08/18 0020    Hinda Kehr, MD 04/08/18 [phone]

## 2018-04-15 ENCOUNTER — Ambulatory Visit: Payer: Medicaid Other

## 2018-04-15 ENCOUNTER — Ambulatory Visit
Admission: RE | Admit: 2018-04-15 | Discharge: 2018-04-15 | Disposition: A | Discharge status: Home / Self Care | Payer: Medicaid Other | Source: Ambulatory Visit | Attending: Maternal & Fetal Medicine | Admitting: Maternal & Fetal Medicine

## 2018-04-15 DIAGNOSIS — Z3A17 17 weeks gestation of pregnancy: Secondary | ICD-10-CM | POA: Diagnosis not present

## 2018-04-15 DIAGNOSIS — O09521 Supervision of elderly multigravida, first trimester: Secondary | ICD-10-CM | POA: Diagnosis present

## 2018-04-15 DIAGNOSIS — O09522 Supervision of elderly multigravida, second trimester: Secondary | ICD-10-CM | POA: Insufficient documentation

## 2018-04-19 NOTE — Addendum Note (Signed)
Encounter addended by: Gatha Mayer, MD on: 04/19/2018 8:42 AM  Actions taken: Sign clinical note

## 2018-06-21 ENCOUNTER — Other Ambulatory Visit: Payer: Self-pay | Admitting: *Deleted

## 2018-06-21 DIAGNOSIS — O09521 Supervision of elderly multigravida, first trimester: Secondary | ICD-10-CM

## 2018-06-21 DIAGNOSIS — O364XX0 Maternal care for intrauterine death, not applicable or unspecified: Secondary | ICD-10-CM

## 2018-06-21 DIAGNOSIS — O09891 Supervision of other high risk pregnancies, first trimester: Secondary | ICD-10-CM

## 2018-06-24 ENCOUNTER — Ambulatory Visit
Admission: RE | Admit: 2018-06-24 | Discharge: 2018-06-24 | Disposition: A | Discharge status: Home / Self Care | Payer: Medicaid Other | Source: Ambulatory Visit | Attending: Maternal & Fetal Medicine | Admitting: Maternal & Fetal Medicine

## 2018-06-24 DIAGNOSIS — O09892 Supervision of other high risk pregnancies, second trimester: Secondary | ICD-10-CM | POA: Insufficient documentation

## 2018-06-24 DIAGNOSIS — Z3689 Encounter for other specified antenatal screening: Secondary | ICD-10-CM | POA: Diagnosis present

## 2018-06-24 DIAGNOSIS — O09891 Supervision of other high risk pregnancies, first trimester: Secondary | ICD-10-CM | POA: Diagnosis present

## 2018-06-24 DIAGNOSIS — O09522 Supervision of elderly multigravida, second trimester: Secondary | ICD-10-CM | POA: Diagnosis not present

## 2018-06-24 DIAGNOSIS — O364XX0 Maternal care for intrauterine death, not applicable or unspecified: Secondary | ICD-10-CM

## 2018-06-24 DIAGNOSIS — Z3A27 27 weeks gestation of pregnancy: Secondary | ICD-10-CM | POA: Diagnosis not present

## 2018-06-24 DIAGNOSIS — O09292 Supervision of pregnancy with other poor reproductive or obstetric history, second trimester: Secondary | ICD-10-CM | POA: Insufficient documentation

## 2018-06-24 DIAGNOSIS — O09521 Supervision of elderly multigravida, first trimester: Secondary | ICD-10-CM

## 2018-07-01 ENCOUNTER — Other Ambulatory Visit: Payer: Self-pay

## 2018-07-01 ENCOUNTER — Encounter: Payer: Self-pay | Admitting: Emergency Medicine

## 2018-07-01 ENCOUNTER — Ambulatory Visit
Admission: EM | Admit: 2018-07-01 | Discharge: 2018-07-01 | Disposition: A | Discharge status: Home / Self Care | Payer: Medicaid Other | Attending: Family Medicine | Admitting: Family Medicine

## 2018-07-01 DIAGNOSIS — R0602 Shortness of breath: Secondary | ICD-10-CM | POA: Diagnosis present

## 2018-07-01 DIAGNOSIS — O98513 Other viral diseases complicating pregnancy, third trimester: Secondary | ICD-10-CM | POA: Diagnosis not present

## 2018-07-01 DIAGNOSIS — Z3A28 28 weeks gestation of pregnancy: Secondary | ICD-10-CM | POA: Diagnosis not present

## 2018-07-01 DIAGNOSIS — B349 Viral infection, unspecified: Secondary | ICD-10-CM | POA: Insufficient documentation

## 2018-07-01 DIAGNOSIS — R05 Cough: Secondary | ICD-10-CM | POA: Diagnosis present

## 2018-07-01 DIAGNOSIS — O26893 Other specified pregnancy related conditions, third trimester: Secondary | ICD-10-CM | POA: Diagnosis present

## 2018-07-01 HISTORY — DX: Essential (primary) hypertension: I10

## 2018-07-01 LAB — RAPID STREP SCREEN (MED CTR MEBANE ONLY): Streptococcus, Group A Screen (Direct): NEGATIVE

## 2018-07-01 NOTE — Discharge Instructions (Signed)
Robitussin or robitussin DM.  Rest, fluids.  Take care  Dr. Lacinda Axon

## 2018-07-01 NOTE — ED Provider Notes (Signed)
[facility]    CSN: [id number] Arrival date & time: 07/01/18  1841  History   Chief Complaint Chief Complaint  Patient presents with  . Shortness of Breath  . Cough   HPI  37 year old female who is currently [redacted] weeks pregnant presents with cough.  Patient reports that she has been sick for the past 6 days.  She reports sore throat, cough, "popping" in the ears.  She reports that the cough is her predominant symptom.  Keeps her up at night.  She has used albuterol and Mucinex without improvement.  No known exacerbating factors.  No fever.  She does report some associated shortness of breath.  Patient has obviously had sick contacts as she works in [employer].  Patient called her OB/GYN office and they advised her to be seen.   No other complaints.  Past Medical History:  Diagnosis Date  . Anemia   . Hypertension    Patient Active Problem List   Diagnosis Date Noted  . Short interval between pregnancies affecting pregnancy in first trimester, antepartum 03/18/2018  . Advanced maternal age in multigravida, first trimester 03/18/2018  . Fetal demise, greater than 22 weeks, antepartum, single gestation 11/13/2017   Past Surgical History:  Procedure Laterality Date  . NO PAST SURGERIES      OB History    Gravida  6   Para  5   Term  4   Preterm      AB      Living  4     SAB      TAB      Ectopic      Multiple  0   Live Births               Home Medications    Prior to Admission medications   Medication Sig Start Date End Date Taking? Authorizing Provider  acyclovir (ZOVIRAX) 800 MG tablet Take 1 tablet (800 mg total) by mouth 5 (five) times daily. 04/07/18  Yes Menshew, Dannielle Karvonen, PA-C  albuterol (PROVENTIL HFA;VENTOLIN HFA) 108 (90 Base) MCG/ACT inhaler Inhale 2 puffs into the lungs every 4 (four) hours as needed for wheezing. 12/30/17  Yes Marylene Land, NP  aspirin 81 MG chewable tablet Chew by mouth daily.   Yes [provider]  ferrous sulfate 220 (44 Fe) MG/5ML solution Take 220 mg by mouth daily.   Yes [provider]  NIFEdipine (PROCARDIA) 10 MG capsule Take 5 mg by mouth once.   Yes [provider]  Prenatal Vit-Fe Fumarate-FA (PRENATAL MULTIVITAMIN) TABS tablet Take 1 tablet by mouth daily at 12 noon.   Yes [provider]    Family History Family History  Problem Relation Age of Onset  . Hypertension Mother     Social History Social History   Tobacco Use  . Smoking status: Never Smoker  . Smokeless tobacco: Never Used  Substance Use Topics  . Alcohol use: No  . Drug use: No     Allergies   Amoxicillin and Vicodin [hydrocodone-acetaminophen]   Review of Systems Review of Systems  Constitutional: Negative for fever.  HENT: Positive for ear pain and sore throat.   Respiratory: Positive for cough and shortness of breath.    Physical Exam Triage Vital Signs ED Triage Vitals  Enc Vitals Group     BP 07/01/18 1855 (!) 147/88     Pulse Rate 07/01/18 1855 85     Resp 07/01/18 1855 18  Temp 07/01/18 1855 98.3 F (36.8 C)     Temp Source 07/01/18 1855 Oral     SpO2 07/01/18 1855 100 %     Weight 07/01/18 1854 202 lb (91.6 kg)     Height 07/01/18 1854 5\' 2"  (1.575 m)     Head Circumference --      Peak Flow --      Pain Score 07/01/18 1853 8     Pain Loc --      Pain Edu? --      Excl. in [place]? --    No data found.  Updated Vital Signs BP (!) 147/88 (BP Location: Left Arm)   Pulse 85   Temp 98.3 F (36.8 C) (Oral)   Resp 18   Ht 5\' 2"  (1.575 m)   Wt 91.6 kg   LMP 12/09/2017 (Approximate) Comment: irregular periods  SpO2 100%   BMI 36.95 kg/m   Visual Acuity Right Eye Distance:   Left Eye Distance:   Bilateral Distance:    Right Eye Near:   Left Eye Near:    Bilateral Near:     Physical Exam  Constitutional: She is oriented to person, place, and time. She appears well-developed. No distress.  HENT:  Head: Normocephalic and  atraumatic.  Mouth/Throat: Oropharynx is clear and moist.  TM's without evidence of infection.  Cardiovascular: Normal rate and regular rhythm.  Pulmonary/Chest: Effort normal and breath sounds normal. No respiratory distress. She has no wheezes. She has no rales.  Neurological: She is alert and oriented to person, place, and time.  Psychiatric: She has a normal mood and affect. Her behavior is normal.  Nursing note and vitals reviewed.  UC Treatments / Results  Labs (all labs ordered are listed, but only abnormal results are displayed) Labs Reviewed  RAPID STREP SCREEN ([facility] ONLY)  CULTURE, GROUP A STREP [facility])    EKG None  Radiology No results found.  Procedures Procedures (including critical care time)  Medications Ordered in UC Medications - No data to display  Initial Impression / Assessment and Plan / UC Course  I have reviewed the triage vital signs and the nursing notes.  Pertinent labs & imaging results that were available during my care of the patient were reviewed by me and considered in my medical decision making (see chart for details).    37 year old female presents with a viral illness.  I advised her to try over-the-counter Robitussin as it is considered safe in pregnancy.  No antibiotics or further intervention needed at this time.  Patient will up with her OB/GYN.  Final Clinical Impressions(s) / UC Diagnoses   Final diagnoses:  Viral illness     Discharge Instructions     Robitussin or robitussin DM.  Rest, fluids.  Take care  Dr. Lacinda Axon    ED Prescriptions    None     Controlled Substance Prescriptions [employer] Controlled Substance Registry consulted? Not Applicable   Coral Spikes, DO 07/01/18 1934

## 2018-07-01 NOTE — ED Triage Notes (Signed)
Pt c/o cough, sob, sore throat (burning), congestion. She is coughing up green sputum. Started 6 days ago. She has been using an albuterol inhaler and does not seem to be helping. She has also been using mucinex. Cough is worse at night. Pt is [redacted] weeks pregnant.

## 2018-07-04 LAB — CULTURE, GROUP A STREP (THRC)

## 2018-07-21 ENCOUNTER — Other Ambulatory Visit: Payer: Self-pay

## 2018-07-21 ENCOUNTER — Inpatient Hospital Stay
Admission: EM | Admit: 2018-07-21 | Discharge: 2018-07-23 | DRG: 833 | Disposition: A | Discharge status: Home / Self Care | Payer: Medicaid Other | Attending: Obstetrics and Gynecology | Admitting: Obstetrics and Gynecology

## 2018-07-21 DIAGNOSIS — D509 Iron deficiency anemia, unspecified: Secondary | ICD-10-CM | POA: Diagnosis present

## 2018-07-21 DIAGNOSIS — E669 Obesity, unspecified: Secondary | ICD-10-CM | POA: Diagnosis present

## 2018-07-21 DIAGNOSIS — Z79899 Other long term (current) drug therapy: Secondary | ICD-10-CM | POA: Diagnosis not present

## 2018-07-21 DIAGNOSIS — O119 Pre-existing hypertension with pre-eclampsia, unspecified trimester: Secondary | ICD-10-CM | POA: Diagnosis present

## 2018-07-21 DIAGNOSIS — O99214 Obesity complicating childbirth: Secondary | ICD-10-CM | POA: Diagnosis present

## 2018-07-21 DIAGNOSIS — O1413 Severe pre-eclampsia, third trimester: Secondary | ICD-10-CM | POA: Diagnosis present

## 2018-07-21 DIAGNOSIS — Z7951 Long term (current) use of inhaled steroids: Secondary | ICD-10-CM

## 2018-07-21 DIAGNOSIS — Z885 Allergy status to narcotic agent status: Secondary | ICD-10-CM | POA: Diagnosis not present

## 2018-07-21 DIAGNOSIS — H53459 Other localized visual field defect, unspecified eye: Secondary | ICD-10-CM | POA: Diagnosis present

## 2018-07-21 DIAGNOSIS — O1410 Severe pre-eclampsia, unspecified trimester: Secondary | ICD-10-CM

## 2018-07-21 DIAGNOSIS — O99013 Anemia complicating pregnancy, third trimester: Secondary | ICD-10-CM | POA: Diagnosis present

## 2018-07-21 DIAGNOSIS — Z881 Allergy status to other antibiotic agents status: Secondary | ICD-10-CM | POA: Diagnosis not present

## 2018-07-21 DIAGNOSIS — Z3A31 31 weeks gestation of pregnancy: Secondary | ICD-10-CM

## 2018-07-21 DIAGNOSIS — Z7982 Long term (current) use of aspirin: Secondary | ICD-10-CM

## 2018-07-21 LAB — PROTEIN / CREATININE RATIO, URINE
Creatinine, Urine: 509 mg/dL
PROTEIN CREATININE RATIO: 0.63 mg/mg{creat} — AB (ref 0.00–0.15)
TOTAL PROTEIN, URINE: 320 mg/dL

## 2018-07-21 MED ORDER — MAGNESIUM SULFATE BOLUS VIA INFUSION
4.0000 g | Freq: Once | INTRAVENOUS | Status: AC
  Administered 2018-07-21: 4 g via INTRAVENOUS
  Filled 2018-07-21: qty 500

## 2018-07-21 MED ORDER — BETAMETHASONE SOD PHOS & ACET 6 (3-3) MG/ML IJ SUSP
12.0000 mg | INTRAMUSCULAR | Status: AC
  Administered 2018-07-21 – 2018-07-22 (×2): 12 mg via INTRAMUSCULAR
  Filled 2018-07-21: qty 2

## 2018-07-21 MED ORDER — ASPIRIN 81 MG PO CHEW
81.0000 mg | CHEWABLE_TABLET | Freq: Every day | ORAL | Status: DC
  Administered 2018-07-22 – 2018-07-23 (×2): 81 mg via ORAL
  Filled 2018-07-21 (×2): qty 1

## 2018-07-21 MED ORDER — FERROUS SULFATE 325 (65 FE) MG PO TABS
325.0000 mg | ORAL_TABLET | Freq: Every day | ORAL | Status: DC
  Administered 2018-07-21 – 2018-07-23 (×3): 325 mg via ORAL
  Filled 2018-07-21 (×3): qty 1

## 2018-07-21 MED ORDER — CALCIUM CARBONATE ANTACID 500 MG PO CHEW: 2.0000 | CHEWABLE_TABLET | ORAL | Status: DC | PRN

## 2018-07-21 MED ORDER — LABETALOL HCL 5 MG/ML IV SOLN: 20.0000 mg | INTRAVENOUS | Status: DC | PRN

## 2018-07-21 MED ORDER — LABETALOL HCL 5 MG/ML IV SOLN: 40.0000 mg | INTRAVENOUS | Status: DC | PRN

## 2018-07-21 MED ORDER — PRENATAL MULTIVITAMIN CH
1.0000 | ORAL_TABLET | Freq: Every day | ORAL | Status: DC
  Administered 2018-07-22: 1 via ORAL
  Filled 2018-07-21: qty 1

## 2018-07-21 MED ORDER — LABETALOL HCL 5 MG/ML IV SOLN: 80.0000 mg | INTRAVENOUS | Status: DC | PRN

## 2018-07-21 MED ORDER — NIFEDIPINE ER OSMOTIC RELEASE 30 MG PO TB24
90.0000 mg | ORAL_TABLET | Freq: Every day | ORAL | Status: DC
  Administered 2018-07-22 – 2018-07-23 (×2): 90 mg via ORAL
  Filled 2018-07-21 (×2): qty 3

## 2018-07-21 MED ORDER — CALCIUM GLUCONATE 10 % IV SOLN
INTRAVENOUS | Status: AC
  Filled 2018-07-21: qty 10

## 2018-07-21 MED ORDER — DOCUSATE SODIUM 100 MG PO CAPS
100.0000 mg | ORAL_CAPSULE | Freq: Every day | ORAL | Status: DC
  Administered 2018-07-22 – 2018-07-23 (×2): 100 mg via ORAL
  Filled 2018-07-21 (×2): qty 1

## 2018-07-21 MED ORDER — LACTATED RINGERS IV SOLN
INTRAVENOUS | Status: DC
  Administered 2018-07-21 – 2018-07-22 (×2): via INTRAVENOUS

## 2018-07-21 MED ORDER — MAGNESIUM SULFATE 40 G IN LACTATED RINGERS - SIMPLE
2.0000 g/h | INTRAVENOUS | Status: AC
  Administered 2018-07-21 – 2018-07-23 (×3): 2 g/h via INTRAVENOUS
  Filled 2018-07-21: qty 500

## 2018-07-21 MED ORDER — ACETAMINOPHEN 325 MG PO TABS
650.0000 mg | ORAL_TABLET | ORAL | Status: DC | PRN
  Administered 2018-07-21 – 2018-07-22 (×2): 650 mg via ORAL
  Filled 2018-07-21 (×2): qty 2

## 2018-07-21 MED ORDER — PRENATAL MULTIVITAMIN CH: 1.0000 | ORAL_TABLET | Freq: Every day | ORAL | Status: DC

## 2018-07-21 MED ORDER — HYDRALAZINE HCL 20 MG/ML IJ SOLN: 10.0000 mg | INTRAMUSCULAR | Status: DC | PRN

## 2018-07-21 MED ORDER — MAGNESIUM SULFATE 40 G IN LACTATED RINGERS - SIMPLE
INTRAVENOUS | Status: AC
  Filled 2018-07-21: qty 500

## 2018-07-21 MED ORDER — ONDANSETRON HCL 4 MG/2ML IJ SOLN: 4.0000 mg | Freq: Three times a day (TID) | INTRAMUSCULAR | Status: DC | PRN

## 2018-07-21 NOTE — H&P (Signed)
OB History & Physical   History of Present Illness:  Chief Complaint: Severe range BP in the office  HPI:  Cindy Hays is a 37 y.o. [id number] female at [redacted]w[redacted]d dated by [redacted]w[redacted]d ultrasound.  She presents to L&D for evaluation for concern for superimposed preeclampsia.   She reports:  -active fetal movement -no leakage of fluid  -no vaginal bleeding -no contractions -no headache -floaters in her vision earlier today, but not current -no nausea or vomiting -no epigastric or RUQ pain -no increase in swelling  Pregnancy Issues: 1. Hx IUFD last pregnancy at 26.4wks (10/2017) 2. Chronic HTN onmedication since 11/2017 3. History of preeclampsia at term with 1st and 2nd pregnancies 4. Hx PPROM with Preterm birth at 80wks with 4th baby 5. Obesity, prepregnancy BMI 35.6 6. 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 5cm simple ovarian cyst (Right) 9. Anemia, hemoglobin 7.7 on 07/21/18, hematology consult for IV iron infusion pending   Maternal Medical History:   Past Medical History:  Diagnosis Date  . Anemia   . Hypertension     Past Surgical History:  Procedure Laterality Date  . NO PAST SURGERIES      Allergies  Allergen Reactions  . Amoxicillin Rash  . Vicodin [Hydrocodone-Acetaminophen] Rash    Prior to Admission medications   Medication Sig Start Date End Date Taking? Authorizing Provider  acyclovir (ZOVIRAX) 800 MG tablet Take 1 tablet (800 mg total) by mouth 5 (five) times daily. 04/07/18   Menshew, Dannielle Karvonen, PA-C  albuterol (PROVENTIL HFA;VENTOLIN HFA) 108 (90 Base) MCG/ACT inhaler Inhale 2 puffs into the lungs every 4 (four) hours as needed for wheezing. 12/30/17   Marylene Land, NP  aspirin 81 MG chewable tablet Chew by mouth daily.    [provider]   ferrous sulfate 220 (44 Fe) MG/5ML solution Take 220 mg by mouth daily.    [provider]  NIFEDIPINE PO Take 60 mg by mouth once.     [provider]  Prenatal Vit-Fe Fumarate-FA (PRENATAL MULTIVITAMIN) TABS tablet Take 1 tablet by mouth daily at 12 noon.    [provider]     Prenatal care site: [place]   Social History: She  reports that she has never smoked. She has never used smokeless tobacco. She reports that she does not drink alcohol or use drugs.  Family History: family history includes Hypertension in her mother.   Review of Systems: A full review of systems was performed and negative except as noted in the HPI.    Physical Exam:  Vital Signs: BP (!) 156/85   Pulse 77   Temp 98.7 F (37.1 C) (Oral)   Resp 18   Ht 5\' 2"  (1.575 m)   Wt 92.5 kg   LMP 11/30/2017 Comment: irregular periods  BMI 37.31 kg/m   General:   alert, cooperative, appears stated age and no distress  Skin:  normal and no rash or abnormalities  Neurologic:    Alert & oriented x 3  Lungs:   clear to auscultation bilaterally  Heart:   regular rate and rhythm, S1, S2 normal, no murmur, click, rub or gallop  Abdomen:  soft, non-tender; bowel sounds normal; no masses,  no organomegaly  Pelvis:  Exam deferred.  FHT:  135 BPM  Extremities: : non-tender, symmetric, trace edema bilaterally.  DTRs: +2    EFW: 944g 2lb1oz 25% on growth ultrasound on 06/24/2018  No results found for this or any previous visit (from the past 24 hour(s)).  Pertinent Results:  Prenatal Labs: Blood type/Rh A+  Antibody screen neg  Rubella Immune  Varicella Immune  RPR NR  HBsAg Neg  HIV NR  GC neg  Chlamydia neg  Genetic screening NIPT negative, XY  1 hour GTT 155  3 hour GTT Unable to tolerate, fasting 67  GBS unknown   FHT: FHR: 130 bpm, variability: moderate,  accelerations:  Present,  decelerations:  Absent Category/reactivity:  Category I TOCO: none  [place] Mfm Ob  Follow Up  Result Date: 06/24/2018 ----------------------------------------------------------------------  OBSTETRICS REPORT                      (Signed Final 06/24/2018 10:22 am) ---------------------------------------------------------------------- PATIENT INFO:  ID #:       [id number]                          D.O.B.:  1981/11/13 (36 yrs)  Name:       Cindy Hays                 Visit Date: 06/24/2018 09:48 am ---------------------------------------------------------------------- PERFORMED BY:  Performed By:     Carron Brazen [place]ernell      Referred By:      REBECCA A                                                             [facility] ---------------------------------------------------------------------- SERVICE(S) PROVIDED:   [place] MFM OB FOLLOW UP                                  82423.53  ---------------------------------------------------------------------- INDICATIONS:   [redacted] weeks gestation of pregnancy                Z3A.27   Advanced Maternal Age  ---------------------------------------------------------------------- FETAL EVALUATION:  Num Of Fetuses:     1  Fetal Heart         132  Rate(bpm):  Cardiac Activity:   Present  Presentation:       Cephalic  Placenta:           Anterior  Amniotic Fluid  AFI FV:      Normal  AFI Sum(cm)     %Tile  16.6            61 ---------------------------------------------------------------------- BIOMETRY:  BPD:      64.6  mm     G. Age:  26w 1d         14  %    CI:        70.74   %    70 - 86                                                          FL/HC:      20.2   %  18.6 - 20.4  HC:      244.8  mm     G. Age:  26w 4d         13  %    HC/AC:      1.12        1.05 - 1.21  AC:      219.4  mm     G. Age:  26w 3d         24  %    FL/BPD:     76.6   %    71 - 87  FL:       49.5  mm     G. Age:  26w 5d         26  %    FL/AC:      22.6   %    20 - 24  HUM:      46.3  mm     G. Age:  27w 2d         53  %  Est. FW:     944  gm      2 lb 1 oz     25  %  ---------------------------------------------------------------------- GESTATIONAL AGE:  U/S Today:     26w 3d                                        EDD:   09/27/18  Best:          27w 0d     Det. ByLoman Chroman         EDD:   09/23/18                                      (01/28/18) ---------------------------------------------------------------------- ANATOMY:  Cavum:                 Within Normal Limits   Stomach:                Seen  Ventricles:            Normal appearance      Abdominal Wall:         Visualized                                                                        previously  Cerebellum:            Visualized             Cord Vessels:           3 vessels,                         previously                                     visualized previously  Posterior Fossa:       Visualized  Kidneys:                Normal appearance                         previously  Face:                  Within Normal Limits   Bladder:                Seen  Lips:                  Visualized             Spine:                  Normal appearance                         previously  Heart:                 4-Chamber view         Upper Extremities:      Visualized                         appears normal                                 previously  RVOT:                  Normal appearance      Lower Extremities:      Visualized                                                                        previously  LVOT:                  Normal appearance ---------------------------------------------------------------------- IMPRESSION:  Dear Dr. Magda Kiel,  Thank you for referring your patient to for follow up fetal  growth due to BMI > 30.  Ultrasound demonstrates a single, live fetus at 53 weeks.  Dating is by ultrasound performed at [facility] clinic on  01/28/18; measurements were consistent with [redacted] weeks  gestation.  The fetal biometry correlates with established dating.  Adequate interval growth noted.  The  estimated fetal weight is at the 25   percentile.  The  amniotic fluid volume is normal and active fetal movements  are seen.  Recommend follow-up scan for fetal growth  in 4 weeks.  Please schedule where convenient.  Thank you for allowing [place] to participate in your patient's care.  Please do not hesitate to contact us if we can be of further  assistance. ----------------------------------------------------------------------                   Manfred Shirts, MD Electronically Signed Final Report   06/24/2018 10:22 am ----------------------------------------------------------------------    Assessment:  Cindy Hays is a 37 y.o. [id number] female at [redacted]w[redacted]d with superimposed preeclampsia.   Plan:  1. Admit to Labor & Delivery; consents reviewed and obtained  2. Fetal Well being  - Fetal Tracing: category I - GBS  unknown  3. Routine OB: - Prenatal labs reviewed, as above - Rh positive - Prenatal vitamin - Regular diet  4. Superimposed preeclampsia: - Magnesium sulfate for neuroprotection: 4mg  loading dose followed by 2mg /hr  - PO nifedipine increased to 90mg /day from 60mg /day - IV labetalol PRN for severe range pressures, can add on PO labetalol if needed - Strict Is & Os - No signs or symptoms of additional severe features, labs WNL; CBC, CMP, and P/C ratio ordered for tomorrow morning - MFM consult for tomorrow for their consideration on inpatient vs outpatient management  5. Fetal well-being: - Continuous EFM - Betamethasone ordered to be given tonight and repeated at 24 hours  6. Anemia: - Continue prenatal vitamin and PO iron supplement - Outpatient hematology consult for IV iron infusion pending   Discussed patient and plan of care with Dr. Larey Days, who is in agreement with this plan.    Lisette Grinder, CNM 07/21/2018 7:25 PM ----- Lisette Grinder Certified Nurse Midwife [facility], Department of [facility]

## 2018-07-22 ENCOUNTER — Inpatient Hospital Stay: Payer: Medicaid Other

## 2018-07-22 LAB — COMPREHENSIVE METABOLIC PANEL
ALT: 21 U/L (ref 0–44)
ANION GAP: 6 (ref 5–15)
AST: 35 U/L (ref 15–41)
Albumin: 2.8 g/dL — ABNORMAL LOW (ref 3.5–5.0)
Alkaline Phosphatase: 187 U/L — ABNORMAL HIGH (ref 38–126)
BILIRUBIN TOTAL: 0.7 mg/dL (ref 0.3–1.2)
BUN: 9 mg/dL (ref 6–20)
CO2: 19 mmol/L — ABNORMAL LOW (ref 22–32)
Calcium: 7.7 mg/dL — ABNORMAL LOW (ref 8.9–10.3)
Chloride: 107 mmol/L (ref 98–111)
Creatinine, Ser: 0.54 mg/dL (ref 0.44–1.00)
GFR calc Af Amer: >60 mL/min (ref >60)
Glucose, Bld: 148 mg/dL — ABNORMAL HIGH (ref 70–99)
POTASSIUM: 4 mmol/L (ref 3.5–5.1)
Sodium: 132 mmol/L — ABNORMAL LOW (ref 135–145)
TOTAL PROTEIN: 7.2 g/dL (ref 6.5–8.1)

## 2018-07-22 LAB — PREPARE RBC (CROSSMATCH)

## 2018-07-22 LAB — CBC
HEMATOCRIT: 25 % — AB (ref 35.0–47.0)
HEMOGLOBIN: 7.8 g/dL — AB (ref 12.0–16.0)
MCH: 22.7 pg — ABNORMAL LOW (ref 26.0–34.0)
MCHC: 31.1 g/dL — ABNORMAL LOW (ref 32.0–36.0)
MCV: 73.1 fL — ABNORMAL LOW (ref 80.0–100.0)
Platelets: 277 10*3/uL (ref 150–440)
RBC: 3.42 MIL/uL — ABNORMAL LOW (ref 3.80–5.20)
RDW: 20.8 % — AB (ref 11.5–14.5)
WBC: 8.5 10*3/uL (ref 3.6–11.0)

## 2018-07-22 MED ORDER — SODIUM CHLORIDE 0.9% IV SOLUTION
Freq: Once | INTRAVENOUS | Status: AC
  Administered 2018-07-22: 12:00:00 via INTRAVENOUS

## 2018-07-22 NOTE — Progress Notes (Signed)
Patient ID: Cindy Hays, female   DOB: 09-18-1981, 37 y.o.   MRN: [id number] No interval change from this am   MFM performed an u/s . Growth 29% and AFI=12 cm  BP stable , no severe BP today   blood transfusion complete .   reassuring fetal monitoring  Recheck labs in am

## 2018-07-22 NOTE — Progress Notes (Signed)
Cindy Hays is a 37 y.o. [id number] at 86w0dadmitted yesterday for preeclampsia with severe features  Started on Magnesium and received steroids for fetal lung maturity   She is on PO Procardia 90 xl daily and has not required any additional blood pressure meds . No h/a this am or scotomata. Undergoing 1 unit  Blood transfusion  for anemia .   Objective: BP 136/84   Pulse 78   Temp 98 F (36.7 C) (Oral)   Resp 15   Ht '5\' 2"'  (1.575 m)   Wt 92.5 kg   LMP 11/30/2017 Comment: irregular periods  SpO2 99%   BMI 37.31 kg/m  I/O last 3 completed shifts: In: 1767.6 [P.O.:470; I.V.:1297.6] Out: 1050 [Urine:1050] Total I/O In: 256.7 [I.V.:256.7] Out: 500 [Urine:500] Lungs CTA  CV RRR  FHT:  FHR: 115 bpm, variability: minimal ,  accelerations:  Present,  decelerations:  Absent UC:   none SVE:    not checked  Labs: Lab Results  Component Value Date   WBC 8.5 07/22/2018   HGB 7.8 (L) 07/22/2018   HCT 25.0 (L) 07/22/2018   MCV 73.1 (L) 07/22/2018   PLT 277 07/22/2018   Results for orders placed or performed during the hospital encounter of 07/21/18 (from the past 72 hour(s))  Type and screen [place]    Status: None (Preliminary result)   Collection Time: 07/21/18  8:40 PM  Result Value Ref Range   ABO/RH(D) A POS    Antibody Screen NEG    Sample Expiration 07/24/2018    Unit Number [id number]   Blood Component Type RED CELLS,LR    Unit division 00    Status of Unit ALLOCATED    Transfusion Status OK TO TRANSFUSE    Crossmatch Result COMPATIBLE    Unit Number [id number]   Blood Component Type RED CELLS,LR    Unit division 00    Status of Unit ISSUED    Transfusion Status OK TO TRANSFUSE    Crossmatch Result      COMPATIBLE [facility] [address] [place] [address]  Protein / creatinine ratio, urine     Status: Abnormal   Collection Time: 07/21/18  8:43 PM  Result Value Ref Range   Creatinine, Urine  509 mg/dL    Comment: RESULT CONFIRMED BY MANUAL DILUTION / AKT   Total Protein, Urine 320 mg/dL    Comment: RESULT CONFIRMED BY MANUAL DILUTION / JAG NO NORMAL RANGE ESTABLISHED FOR THIS TEST    Protein Creatinine Ratio 0.63 (H) 0.00 - 0.15 mg/mg[Cre]    Comment: [facility] [address] [place] [address] Comprehensive metabolic panel     Status: Abnormal   Collection Time: 07/22/18  5:10 AM  Result Value Ref Range   Sodium 132 (L) 135 - 145 mmol/L   Potassium 4.0 3.5 - 5.1 mmol/L   Chloride 107 98 - 111 mmol/L   CO2 19 (L) 22 - 32 mmol/L   Glucose, Bld 148 (H) 70 - 99 mg/dL   BUN 9 6 - 20 mg/dL   Creatinine, Ser 0.54 0.44 - 1.00 mg/dL   Calcium 7.7 (L) 8.9 - 10.3 mg/dL   Total Protein 7.2 6.5 - 8.1 g/dL   Albumin 2.8 (L) 3.5 - 5.0 g/dL   AST 35 15 - 41 U/L   ALT 21 0 - 44 U/L   Alkaline Phosphatase 187 (H) 38 - 126 U/L  Total Bilirubin 0.7 0.3 - 1.2 mg/dL   GFR calc non Af Amer >60 >60 mL/min   GFR calc Af Amer >60 >60 mL/min    Comment: (NOTE) The eGFR has been calculated using the CKD EPI equation. This calculation has not been validated in all clinical situations. eGFR's persistently <60 mL/min signify possible Chronic Kidney Disease.    Anion gap 6 5 - 15    Comment: Performed at [facility], [address] [place], [place] [address]  CBC     Status: Abnormal   Collection Time: 07/22/18  5:10 AM  Result Value Ref Range   WBC 8.5 3.6 - 11.0 K/uL   RBC 3.42 (L) 3.80 - 5.20 MIL/uL   Hemoglobin 7.8 (L) 12.0 - 16.0 g/dL   HCT 25.0 (L) 35.0 - 47.0 %   MCV 73.1 (L) 80.0 - 100.0 fL   MCH 22.7 (L) 26.0 - 34.0 pg   MCHC 31.1 (L) 32.0 - 36.0 g/dL   RDW 20.8 (H) 11.5 - 14.5 %   Platelets 277 150 - 440 K/uL    Comment: Performed at [facility], [address], [place], [place] [address]  Prepare RBC     Status: None   Collection Time: 07/22/18  6:44 AM  Result Value Ref Range   Order Confirmation      ORDER  PROCESSED BY BLOOD BANK Performed at [facility], [address], [place], [place] [address]    Assessment / Plan: Admission for preeclampsia with severe features . Currently no worsening of features with stabilization of BP .  Anemia preceeding admission( was scheduled to get IV iron outpt)  Reassuring fetal monitoring  Cont 48 hrs magnesium to complete IM steroids . Serial BP , repeat labs in am .  Monitor for deterioration  scd if not out of bed routinely  Limit total IV + po fluids to 3000cc/ 24 hrs  Will have neonatology talk to pt regarding early delivery and what the pt can expect   Cindy Hays 07/22/2018, 8:49 AM

## 2018-07-22 NOTE — Progress Notes (Signed)
MFM called and states they want pt to go to ultrasound. MD notified and order given for pt to be off FHR monitoring to go to ultrasound. Pt stable prior to transport. Portable pulse ox monitor applied. Pt in wheelchair to ultrasound

## 2018-07-22 NOTE — Consult Note (Signed)
Neonatology Consult  Note:  At the request of the patients obstetrician Dr. Ouida Sills I met with Cindy Hays  who is a 37 y.o. [id number] at 39w0dadmitted yesterday for preeclampsia with severe features.  She was started on Magnesium and received betamethasone.     She is on PO Procardia 90 xl daily and has not required any additional blood pressure meds. Of note she is undergoing 1 unit  Blood transfusion  for anemia.     Her last child was delivered 4 weeks early and cared for here in the special care nursery per Ms. Wildes.   We reviewed initial delivery room management, including CPAP, Isle, and low but certainly possible need for intubation for surfactant administration.  We discussed feeding immaturity and need for full po intake with multiple days of good weight gain and no apnea or bradycardia before discharge.  We reviewed increased risk of jaundice, infection, and temperature instability.   Discussed likely length of stay.  Thank you for allowing [facility] participate in her care.  Please call with questions.  BHiginio Roger DO  Neonatologist  The total length of face-to-face or floor / unit time for this encounter was 30 minutes.  Counseling and / or coordination of care was greater than fifty percent of the time.

## 2018-07-23 LAB — CBC
HCT: 27.9 % — ABNORMAL LOW (ref 35.0–47.0)
Hemoglobin: 9 g/dL — ABNORMAL LOW (ref 12.0–16.0)
MCH: 24.6 pg — ABNORMAL LOW (ref 26.0–34.0)
MCHC: 32.1 g/dL (ref 32.0–36.0)
MCV: 76.6 fL — ABNORMAL LOW (ref 80.0–100.0)
PLATELETS: 283 10*3/uL (ref 150–440)
RBC: 3.65 MIL/uL — AB (ref 3.80–5.20)
RDW: 21.3 % — ABNORMAL HIGH (ref 11.5–14.5)
WBC: 9.7 10*3/uL (ref 3.6–11.0)

## 2018-07-23 LAB — COMPREHENSIVE METABOLIC PANEL
ALBUMIN: 2.6 g/dL — AB (ref 3.5–5.0)
ALT: 38 U/L (ref 0–44)
ANION GAP: 6 (ref 5–15)
AST: 57 U/L — AB (ref 15–41)
Alkaline Phosphatase: 187 U/L — ABNORMAL HIGH (ref 38–126)
BUN: 9 mg/dL (ref 6–20)
CALCIUM: 6.9 mg/dL — AB (ref 8.9–10.3)
CO2: 22 mmol/L (ref 22–32)
Chloride: 107 mmol/L (ref 98–111)
Creatinine, Ser: 0.56 mg/dL (ref 0.44–1.00)
GFR calc Af Amer: >60 mL/min (ref >60)
GFR calc non Af Amer: >60 mL/min (ref >60)
Glucose, Bld: 124 mg/dL — ABNORMAL HIGH (ref 70–99)
Potassium: 4.3 mmol/L (ref 3.5–5.1)
SODIUM: 135 mmol/L (ref 135–145)
TOTAL PROTEIN: 7 g/dL (ref 6.5–8.1)
Total Bilirubin: 0.5 mg/dL (ref 0.3–1.2)

## 2018-07-23 LAB — PROTEIN / CREATININE RATIO, URINE
Creatinine, Urine: 24 mg/dL
PROTEIN CREATININE RATIO: 2.88 mg/mg{creat} — AB (ref 0.00–0.15)
TOTAL PROTEIN, URINE: 69 mg/dL

## 2018-07-23 MED ORDER — NIFEDIPINE ER OSMOTIC RELEASE 90 MG PO TB24: 90.0000 mg | ORAL_TABLET | Freq: Every day | ORAL | 30 refills | Status: DC

## 2018-07-23 MED ORDER — MAGNESIUM SULFATE 40 G IN LACTATED RINGERS - SIMPLE
INTRAVENOUS | Status: AC
  Administered 2018-07-23: 2 g/h via INTRAVENOUS
  Filled 2018-07-23: qty 500

## 2018-07-23 NOTE — Discharge Instructions (Signed)
Come Back If:  -Decreased Fetal Movement -Big gush of fluids -heavy vaginal bleeding -Temp over 100.4 -Contractions every 3-5 minutes, lasting at least 1-2 hours -Headache that does not resolve with tylenol -Spotty or Blurred vision -Pain under right breast or rib cage -Increased swelling in feet or hands (A huge difference) -Decreased urine output   Get plenty of rest and stay hydrated! Check Blood pressure morning and night and record on a log. Take prescribed medications. Please don't hesitate to call if you have any questions or concerns!   Call Doctors office Monday morning to be seen that day.Take Blood pressure log and blood pressure cuff to doctors office with you!

## 2018-07-23 NOTE — Discharge Summary (Signed)
Physician Obstetric Discharge Summary  Patient ID: Cindy Hays MRN: [id number] DOB/AGE: [birth year] 35 y.o.   Date of Admission: 07/21/2018  Date of Discharge: 07/23/2018 07/23/18   Admitting Diagnosis: Preeclampsia with severe features superimposed on chronic HTN at 106w6d with scotomata that resolved on admission and a headache that resolved on admission.  Secondary Diagnosis: Anemia in pregnancy, requiring blood transfusion   Discharge Diagnosis: Preeclampsia (mild)  [place] is a [id number] at [redacted]w[redacted]d admitted with concern for PreE with severe features based on neuro-sx. She received steroids for fetal lung maturity and was started on mag for neuroprotection. Her labs were normal, except for proteinuria and significant anemia. She did receive 1 unit of pRBCs during her hospitalization.  She was placed on magnesium for 48hrs and her home dose of nifedipine was increased from 60mg /d to 90 mg/d on admission. Her urine output remained excellent, with no deficit and no s/s of preE with severe features on physical exam.  Her fetal status remained reassuring throughout. She had an ultrasound with MFM at [redacted]w[redacted]d which showed normal AFI 50.0XF, cephalic normally grown fetus with EFW of 29% without lag. She discussed the dx of PreE with MFM, who stressed the need for careful and close monitoring.  After her magnesium was stopped, I had the patient ambulate. Her BP remained in below the severe range.  We discussed several times the expected worsening of PreE, including stroke, seizure, fetal distress, and even in rare cases mortality. We do not know how fast it will progress but the risk can rise precipitously. However, her BP remained below the severe range and she did not have neurosx, even while not on bed rest.  We discussed the advisability of remaining in house and with monitoring, and that I would recommend it. However, the patient is the sole caregiver for her young  children at home, and feels absolutely unable to remain in house with monitoring for several weeks unless we can be sure that she has severe PreE. She is very compliant.   We made the decision to expectantly manage her as an outpt, as she does not have PE, lab or subjective findings of severe PreE. I will obtain 2x weekly labs, fetal monitoring, and twice daily BP checks at home. I will see her in the office in 48 hours to assess. She is aware that if she develops any severe features that she must be monitored in house. These include RUQ pain, headache that does not resolve with a dose of tylenol, decreased urine output or fetal movement, SOB or visual changes, and she will come promptly.   Labs: CBC Latest Ref Rng & Units 07/23/2018 07/22/2018 11/14/2017  WBC 3.6 - 11.0 K/uL 9.7 8.5 6.2  Hemoglobin 12.0 - 16.0 g/dL 9.0(L) 7.8(L) 8.0(L)  Hematocrit 35.0 - 47.0 % 27.9(L) 25.0(L) 25.6(L)  Platelets 150 - 440 K/uL 283 277 229   A POS  Physical exam:  Blood pressure (!) 144/85, pulse 88, temperature 98.4 F (36.9 C), temperature source Oral, resp. rate 16, height 5\' 2"  (1.575 m), weight 92.5 kg, last menstrual period 11/30/2017, SpO2 100 %. General: alert and no distress CV: RRR Pulm: CTAB x6 Abdomen: soft, gravid LE: 1+ edema, no clonus Reflexes: 1+   Discharge Instructions: Per After Visit Summary. Activity: Modified bed rest, with ambulation q3 hrs Diet: Regular Medications: Allergies as of 07/23/2018      Reactions   Amoxicillin Rash   Vicodin [hydrocodone-acetaminophen] Rash      Medication  List    STOP taking these medications   NIFEDIPINE PO Replaced by:  NIFEdipine 90 MG 24 hr tablet     TAKE these medications   acyclovir 800 MG tablet Commonly known as:  ZOVIRAX Take 1 tablet (800 mg total) by mouth 5 (five) times daily.   albuterol 108 (90 Base) MCG/ACT inhaler Commonly known as:  PROVENTIL HFA;VENTOLIN HFA Inhale 2 puffs into the lungs every 4 (four) hours as needed  for wheezing.   aspirin 81 MG chewable tablet Chew by mouth daily.   ferrous sulfate 220 (44 Fe) MG/5ML solution Take 220 mg by mouth daily.   NIFEdipine 90 MG 24 hr tablet Commonly known as:  PROCARDIA XL/ADALAT-CC Take 1 tablet (90 mg total) by mouth daily. Start taking on:  07/24/2018 Replaces:  NIFEDIPINE PO   prenatal multivitamin Tabs tablet Take 1 tablet by mouth daily at 12 noon.      Outpatient follow up:  Follow-up [facility]. Call on 07/26/2018.   Contact information: [place], MD 07/23/2018 8:43 PM

## 2018-07-24 ENCOUNTER — Telehealth: Payer: Self-pay

## 2018-07-24 ENCOUNTER — Telehealth: Payer: Self-pay | Admitting: Certified Nurse Midwife

## 2018-07-24 LAB — TYPE AND SCREEN
ABO/RH(D): A POS
Antibody Screen: NEGATIVE
DONOR AG TYPE: NEGATIVE
UNIT DIVISION: 0
UNIT DIVISION: 0
UNIT DIVISION: 0

## 2018-07-24 LAB — BPAM RBC
BLOOD PRODUCT EXPIRATION DATE: 201909132359
BLOOD PRODUCT EXPIRATION DATE: 201909252359
BLOOD PRODUCT EXPIRATION DATE: 201909272359
ISSUE DATE / TIME: 201909050829
UNIT TYPE AND RH: 600
UNIT TYPE AND RH: 6200
Unit Type and Rh: 6200

## 2018-07-24 NOTE — Telephone Encounter (Signed)
Called patient after L&D RN reported that patient called with severe range BP. RN advised patient to recheck BP and call back regardless. Patient did not call back. Per RN, patient reported no other signs or symptoms of severe disease at that time.   Patient did not answer the phone when I just called. Advised that patient return to the hospital for admission and monitoring, regardless of repeat blood pressure and lack of other symptoms, as continued elevated BP levels are concerning for advancing disease. Patient advised to call with questions.   Lisette Grinder, CNM 07/24/2018 10:05 AM

## 2018-07-24 NOTE — Telephone Encounter (Signed)
Late entry: Patient initially called unit for advice around 0800. Reported taking blood pressure this morning with BP 169/90's. Patient reported she did not have any other signs or symptoms of elevated blood pressure and had just woken up and gone to the bathroom. Advised patient to rest for ten minutes and recheck blood pressure. Told patient to call unit back in ten minutes with new blood pressure reading. Approx 30 mins later, patient had still not called back to unit. RN called M. Haviland CNM to notify of patient's phone call and no follow up. Requested for CNM to contact patient to advise. CNM and this RN attempted to contact patient by phone multiple times without answer. CNM left one message advising patient to come to hospital. At 1412, this RN again attempted to contact patient and patient answered phone stating she had been sleeping and had just woken up. She rechecked her BP at that time and it was 160/93. Advised patient that she needed to come to the hospital for evaluation. Patient agreed to come to be seen, reported that she needed to find child care but she would call the unit to inform if it was going to be more than 2 hours before she could get here.

## 2018-07-25 ENCOUNTER — Telehealth: Payer: Self-pay | Admitting: Certified Nurse Midwife

## 2018-07-25 ENCOUNTER — Encounter: Payer: Self-pay | Admitting: Certified Nurse Midwife

## 2018-07-25 ENCOUNTER — Inpatient Hospital Stay
Admission: EM | Admit: 2018-07-25 | Discharge: 2018-07-29 | DRG: 833 | Discharge status: Against Medical Advice | Payer: Medicaid Other | Attending: Certified Nurse Midwife | Admitting: Certified Nurse Midwife

## 2018-07-25 DIAGNOSIS — J45909 Unspecified asthma, uncomplicated: Secondary | ICD-10-CM | POA: Diagnosis present

## 2018-07-25 DIAGNOSIS — D649 Anemia, unspecified: Secondary | ICD-10-CM | POA: Diagnosis present

## 2018-07-25 DIAGNOSIS — O99213 Obesity complicating pregnancy, third trimester: Secondary | ICD-10-CM | POA: Diagnosis present

## 2018-07-25 DIAGNOSIS — O10013 Pre-existing essential hypertension complicating pregnancy, third trimester: Secondary | ICD-10-CM | POA: Diagnosis present

## 2018-07-25 DIAGNOSIS — Z885 Allergy status to narcotic agent status: Secondary | ICD-10-CM | POA: Diagnosis not present

## 2018-07-25 DIAGNOSIS — O99013 Anemia complicating pregnancy, third trimester: Secondary | ICD-10-CM | POA: Diagnosis present

## 2018-07-25 DIAGNOSIS — O99513 Diseases of the respiratory system complicating pregnancy, third trimester: Secondary | ICD-10-CM | POA: Diagnosis present

## 2018-07-25 DIAGNOSIS — O113 Pre-existing hypertension with pre-eclampsia, third trimester: Principal | ICD-10-CM | POA: Diagnosis present

## 2018-07-25 DIAGNOSIS — E669 Obesity, unspecified: Secondary | ICD-10-CM | POA: Diagnosis present

## 2018-07-25 DIAGNOSIS — Z3A32 32 weeks gestation of pregnancy: Secondary | ICD-10-CM | POA: Diagnosis not present

## 2018-07-25 DIAGNOSIS — Z3A31 31 weeks gestation of pregnancy: Secondary | ICD-10-CM

## 2018-07-25 DIAGNOSIS — O1493 Unspecified pre-eclampsia, third trimester: Secondary | ICD-10-CM | POA: Diagnosis not present

## 2018-07-25 DIAGNOSIS — O119 Pre-existing hypertension with pre-eclampsia, unspecified trimester: Secondary | ICD-10-CM | POA: Diagnosis present

## 2018-07-25 DIAGNOSIS — Z881 Allergy status to other antibiotic agents status: Secondary | ICD-10-CM | POA: Diagnosis not present

## 2018-07-25 DIAGNOSIS — Z8249 Family history of ischemic heart disease and other diseases of the circulatory system: Secondary | ICD-10-CM | POA: Diagnosis not present

## 2018-07-25 DIAGNOSIS — O288 Other abnormal findings on antenatal screening of mother: Secondary | ICD-10-CM

## 2018-07-25 LAB — COMPREHENSIVE METABOLIC PANEL
ALK PHOS: 195 U/L — AB (ref 38–126)
ALT: 33 U/L (ref 0–44)
AST: 38 U/L (ref 15–41)
Albumin: 2.8 g/dL — ABNORMAL LOW (ref 3.5–5.0)
Anion gap: 5 (ref 5–15)
BILIRUBIN TOTAL: 0.6 mg/dL (ref 0.3–1.2)
BUN: 10 mg/dL (ref 6–20)
CALCIUM: 8.7 mg/dL — AB (ref 8.9–10.3)
CO2: 22 mmol/L (ref 22–32)
Chloride: 107 mmol/L (ref 98–111)
Creatinine, Ser: 0.61 mg/dL (ref 0.44–1.00)
GFR calc Af Amer: >60 mL/min (ref >60)
Glucose, Bld: 106 mg/dL — ABNORMAL HIGH (ref 70–99)
POTASSIUM: 4.1 mmol/L (ref 3.5–5.1)
Sodium: 134 mmol/L — ABNORMAL LOW (ref 135–145)
TOTAL PROTEIN: 6.9 g/dL (ref 6.5–8.1)

## 2018-07-25 LAB — CBC
HCT: 29.3 % — ABNORMAL LOW (ref 35.0–47.0)
HEMOGLOBIN: 9.2 g/dL — AB (ref 12.0–16.0)
MCH: 23.2 pg — AB (ref 26.0–34.0)
MCHC: 31.5 g/dL — AB (ref 32.0–36.0)
MCV: 73.9 fL — ABNORMAL LOW (ref 80.0–100.0)
PLATELETS: 334 10*3/uL (ref 150–440)
RBC: 3.96 MIL/uL (ref 3.80–5.20)
RDW: 21 % — AB (ref 11.5–14.5)
WBC: 11.8 10*3/uL — ABNORMAL HIGH (ref 3.6–11.0)

## 2018-07-25 LAB — TSH: TSH: 4.067 u[IU]/mL (ref 0.350–4.500)

## 2018-07-25 LAB — PROTEIN / CREATININE RATIO, URINE
CREATININE, URINE: 83 mg/dL
Protein Creatinine Ratio: 2.54 mg/mg{Cre} — ABNORMAL HIGH (ref 0.00–0.15)
TOTAL PROTEIN, URINE: 211 mg/dL

## 2018-07-25 LAB — URIC ACID: URIC ACID, SERUM: 4.9 mg/dL (ref 2.5–7.1)

## 2018-07-25 MED ORDER — NIFEDIPINE ER OSMOTIC RELEASE 30 MG PO TB24
90.0000 mg | ORAL_TABLET | Freq: Every day | ORAL | Status: DC
  Administered 2018-07-26 – 2018-07-29 (×4): 90 mg via ORAL
  Filled 2018-07-25 (×4): qty 3

## 2018-07-25 MED ORDER — CALCIUM CARBONATE ANTACID 500 MG PO CHEW: 2.0000 | CHEWABLE_TABLET | ORAL | Status: DC | PRN

## 2018-07-25 MED ORDER — HYDRALAZINE HCL 20 MG/ML IJ SOLN: 10.0000 mg | INTRAMUSCULAR | Status: DC | PRN

## 2018-07-25 MED ORDER — LABETALOL HCL 5 MG/ML IV SOLN
80.0000 mg | INTRAVENOUS | Status: DC | PRN
  Filled 2018-07-25: qty 16

## 2018-07-25 MED ORDER — DOCUSATE SODIUM 100 MG PO CAPS
100.0000 mg | ORAL_CAPSULE | Freq: Every day | ORAL | Status: DC
  Administered 2018-07-26 – 2018-07-29 (×5): 100 mg via ORAL
  Filled 2018-07-25 (×5): qty 1

## 2018-07-25 MED ORDER — LABETALOL HCL 5 MG/ML IV SOLN
40.0000 mg | INTRAVENOUS | Status: DC | PRN
  Filled 2018-07-25: qty 8

## 2018-07-25 MED ORDER — SODIUM CHLORIDE 0.9 % IJ SOLN
INTRAMUSCULAR | Status: AC
  Filled 2018-07-25: qty 50

## 2018-07-25 MED ORDER — LABETALOL HCL 5 MG/ML IV SOLN
20.0000 mg | INTRAVENOUS | Status: DC | PRN
  Administered 2018-07-25: 20 mg via INTRAVENOUS
  Filled 2018-07-25: qty 4

## 2018-07-25 MED ORDER — LABETALOL HCL 5 MG/ML IV SOLN
INTRAVENOUS | Status: AC
  Filled 2018-07-25: qty 4

## 2018-07-25 MED ORDER — PRENATAL MULTIVITAMIN CH
1.0000 | ORAL_TABLET | Freq: Every day | ORAL | Status: DC
  Administered 2018-07-26 – 2018-07-29 (×4): 1 via ORAL
  Filled 2018-07-25 (×4): qty 1

## 2018-07-25 MED ORDER — ACETAMINOPHEN 325 MG PO TABS
650.0000 mg | ORAL_TABLET | ORAL | Status: DC | PRN
  Administered 2018-07-27 (×2): 650 mg via ORAL
  Filled 2018-07-25 (×3): qty 2

## 2018-07-25 MED ORDER — FERROUS SULFATE 300 (60 FE) MG/5ML PO SYRP
300.0000 mg | ORAL_SOLUTION | Freq: Two times a day (BID) | ORAL | Status: DC
  Filled 2018-07-25: qty 5

## 2018-07-25 MED ORDER — LABETALOL HCL 200 MG PO TABS
200.0000 mg | ORAL_TABLET | Freq: Two times a day (BID) | ORAL | Status: DC
  Administered 2018-07-26 – 2018-07-29 (×7): 200 mg via ORAL
  Filled 2018-07-25 (×2): qty 1
  Filled 2018-07-25 (×2): qty 2
  Filled 2018-07-25 (×3): qty 1

## 2018-07-25 NOTE — OB Triage Note (Signed)
Pt report b/p of 171/96 and states her "left eye popped out of the socket and popped back in around 2000".  Pt also report H/A on left side since 2000.  No n/v.  No abd pain.

## 2018-07-25 NOTE — Telephone Encounter (Signed)
Called patient to discuss how she is doing and plan of care. Patient reports that blood pressure this morning was 159/91 and blood pressure this evening was 141/89. She continues to feel fatigued, most likely related to anemia, but overall feels okay. She reports that she feels conflicted about how to proceed with management of this pregnancy. Reviewed that ideal setting for monitoring her condition would be inpatient, due to risks of high blood pressure and development of preeclampsia. She verbalized understanding about our concern for the possibility of stroke, placental issues, or other sequelae of preeclampsia, but has four children at home and does not feel she has any good options for their care to be hospitalized until 34 weeks. She asked about choosing to deliver now, acknowledging that this would not be ideal for her baby due to prematurity, but she feels like her options are limited.   Instructed to report to office tomorrow to see Dr. Leafy Ro, as previously discussed with Dr. Leafy Ro. Re-reviewed signs and symptoms of severe features, along with when to present for care. Advised that she can call any time for any questions or concerns.   Cindy Hays, [place] 07/25/2018 8:18 PM

## 2018-07-25 NOTE — H&P (Addendum)
OB History & Physical   History of Present Illness:  Chief Complaint: severe range BP at home  HPI:  Cindy Hays is a 37 y.o. A8T4196 female at [redacted]w[redacted]d dated by [redacted]w[redacted]d ultrasound.  She presents to L&D for evaluation for severe range blood pressure in the setting of chronic hypertension with superimposed preeclampsia. She reports BPs of 159/91 and 141/89 this morning and this afternoon. This evening, she was sitting down to go through her kids' school folders with them when she noticed that her left eye felt strange. She reports that her daughter looked alarmed and that she had the feeling that her "eye popped out of its socket." She reports that she rubbed the eye for a minute and it felt like it went back to its usual position. She checked her BP then and it was 171/111.   She reports:  -active fetal movement -no leakage of fluid  -no vaginal bleeding -no contractions/cramping -mild headache localized to her left orbital region, started after incident with eye, currently rated 4/10 discomfort -no vision changes -no nausea or vomiting -no epigastric or RUQ pain -lower extremity edema that improves with elevation, but a feeling of facial tightness and concern for facial edema  Pregnancy Issues: 1. Hx IUFD last pregnancy at [redacted]w[redacted]d (10/2017) 2. Chronic HTN onmedication since 11/2017 3. History of preeclampsia at term with 1st and 2nd pregnancies 4. Hx PPROM with preterm birth at 7wks with 4th baby 5. Obesity, prepregnancy BMI 35.6 6. 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 5cm simple ovarian cyst (Right) 9. Anemia, hemoglobin 9.0 on 07/23/18 s/p transfusion of 1 unit of PRBCs   Maternal Medical History:   Past Medical History:  Diagnosis Date  . Anemia   . Hypertension     Past  Surgical History:  Procedure Laterality Date  . NO PAST SURGERIES      Allergies  Allergen Reactions  . Amoxicillin Rash  . Vicodin [Hydrocodone-Acetaminophen] Rash    Prior to Admission medications   Medication Sig Start Date End Date Taking? Authorizing Provider  acyclovir (ZOVIRAX) 800 MG tablet Take 1 tablet (800 mg total) by mouth 5 (five) times daily. 04/07/18  Yes Menshew, Dannielle Karvonen, PA-C  albuterol (PROVENTIL HFA;VENTOLIN HFA) 108 (90 Base) MCG/ACT inhaler Inhale 2 puffs into the lungs every 4 (four) hours as needed for wheezing. 12/30/17  Yes Marylene Land, NP  aspirin 81 MG chewable tablet Chew by mouth daily.   Yes [provider]  ferrous sulfate 220 (44 Fe) MG/5ML solution Take 220 mg by mouth daily.   Yes [provider]  NIFEdipine (PROCARDIA XL/ADALAT-CC) 90 MG 24 hr tablet Take 1 tablet (90 mg total) by mouth daily. 07/24/18  Yes Benjaman Kindler, MD  Prenatal Vit-Fe Fumarate-FA (PRENATAL MULTIVITAMIN) TABS tablet Take 1 tablet by mouth daily at 12 noon.   Yes [provider]     Prenatal care site: [facility] History: She  reports that she has never smoked. She has never used smokeless tobacco. She reports that she does not drink alcohol or use drugs.  Family History: family history includes Hypertension in her mother.   Review of Systems: A full review of systems was performed and negative except as noted in the HPI.    Physical Exam:  Vital Signs: BP 137/78   Pulse 87   Temp 98.6 F (37 C) (Oral)   Resp 18   LMP 11/30/2017 Comment: irregular periods  General:   alert, cooperative, appears stated age and mild distress  Skin:  normal and no rash or abnormalities  Neurologic:    Alert & oriented x 3  Lungs:   clear to auscultation bilaterally  Heart:   regular rate and rhythm, S1, S2 normal, no murmur, click, rub or gallop  Abdomen:  soft, non-tender; bowel sounds normal; no masses,  no organomegaly   Pelvis:  Exam deferred.  FHT:  145 BPM  Extremities: : non-tender, symmetric, +1/+2 LE edema bilaterally.  DTRs: +2     Results for orders placed or performed during the hospital encounter of 07/25/18 (from the past 24 hour(s))  Type and screen [place]     Status: None (Preliminary result)   Collection Time: 07/25/18  9:51 PM  Result Value Ref Range   ABO/RH(D) PENDING    Antibody Screen PENDING    Sample Expiration      07/28/2018 Performed at [facility], [address] [place], [place] [address]   Comprehensive metabolic panel     Status: Abnormal   Collection Time: 07/25/18  9:51 PM  Result Value Ref Range   Sodium 134 (L) 135 - 145 mmol/L   Potassium 4.1 3.5 - 5.1 mmol/L   Chloride 107 98 - 111 mmol/L   CO2 22 22 - 32 mmol/L   Glucose, Bld 106 (H) 70 - 99 mg/dL   BUN 10 6 - 20 mg/dL   Creatinine, Ser 0.61 0.44 - 1.00 mg/dL   Calcium 8.7 (L) 8.9 - 10.3 mg/dL   Total Protein 6.9 6.5 - 8.1 g/dL   Albumin 2.8 (L) 3.5 - 5.0 g/dL   AST 38 15 - 41 U/L   ALT 33 0 - 44 U/L   Alkaline Phosphatase 195 (H) 38 - 126 U/L   Total Bilirubin 0.6 0.3 - 1.2 mg/dL   GFR calc non Af Amer >60 >60 mL/min   GFR calc Af Amer >60 >60 mL/min   Anion gap 5 5 - 15  Uric acid     Status: None   Collection Time: 07/25/18  9:51 PM  Result Value Ref Range   Uric Acid, Serum 4.9 2.5 - 7.1 mg/dL  CBC on admission     Status: Abnormal   Collection Time: 07/25/18  9:51 PM  Result Value Ref Range   WBC 11.8 (H) 3.6 - 11.0 K/uL   RBC 3.96 3.80 - 5.20 MIL/uL   Hemoglobin 9.2 (L) 12.0 - 16.0 g/dL   HCT 29.3 (L) 35.0 - 47.0 %   MCV 73.9 (L) 80.0 - 100.0 fL   MCH 23.2 (L) 26.0 - 34.0 pg   MCHC 31.5 (L) 32.0 - 36.0 g/dL   RDW 21.0 (H) 11.5 - 14.5 %   Platelets 334 150 - 440 K/uL    Pertinent Results:  Prenatal Labs: Blood type/Rh A+  Antibody screen neg  Rubella Immune  Varicella Immune  RPR NR  HBsAg Neg  HIV NR  GC neg  Chlamydia neg  Genetic  screening NIPT negative, XY  1 hour GTT 155  3 hour GTT Unable to tolerate, fasting 67  GBS  unknown   FHT: FHR: 140 bpm, variability: moderate,  accelerations:  Present,  decelerations:  Absent Category/reactivity:  Category I TOCO: irritability   [place] Mfm Ob Follow Up  Result Date: 07/22/2018 ----------------------------------------------------------------------  OBSTETRICS REPORT                       (Signed Final 07/22/2018 03:54 pm) ---------------------------------------------------------------------- PATIENT INFO:  ID #:       [id number]                          D.O.B.:  Apr 19, 1981 (37 yrs)  Name:       Randell Patient Sailors                 Visit Date: 07/22/2018 12:01 pm ---------------------------------------------------------------------- PERFORMED BY:  Performed By:     Zella Ball          Ref. Address:     [address]                    [place], [place],                                                             [place] [address]  Referred By:      Boykin Nearing                    MD ---------------------------------------------------------------------- SERVICE(S) PROVIDED:   [place] MFM OB FOLLOW UP                                  [phone]  ---------------------------------------------------------------------- INDICATIONS:   [redacted] weeks gestation of pregnancy                Z3A.31  ---------------------------------------------------------------------- FETAL EVALUATION:  Num Of Fetuses:         1  Fetal Heart Rate(bpm):  112  Cardiac Activity:       Present  Presentation:           Cephalic  Placenta:               Anterior  AFI Sum(cm)     %Tile       Largest Pocket(cm)  12.2            32          4.13 ---------------------------------------------------------------------- BIOMETRY:  BPD:      74.9  mm     G. Age:  30w 0d         14  %    CI:        71.31   %    70 - 86  FL/HC:      19.9   %    19.3 - 21.3  HC:      282.5  mm     G. Age:  31w 0d         16  %    HC/AC:      1.05        0.96 - 1.17  AC:      270.3  mm     G. Age:  31w 1d         50  %    FL/BPD:     75.2   %    71 - 87  FL:       56.3  mm     G. Age:  29w 4d          8  %    FL/AC:      20.8   %    20 - 24  HUM:      51.7  mm     G. Age:  30w 1d         35  %  Est. FW:    1593  gm      3 lb 8 oz     29  % ---------------------------------------------------------------------- GESTATIONAL AGE:  U/S Today:     30w 3d                                        EDD:   09/27/18  Best:          31w 0d     Det. ByLoman Chroman         EDD:   09/23/18                                      (01/28/18) ---------------------------------------------------------------------- ANATOMY:  Ventricles:            Normal appearance      Stomach:                Seen  Choroid Plexus:        Within Normal Limits   Kidneys:                Normal appearance  Heart:                 4-Chamber view         Bladder:                Seen                         appears normal  LVOT:                  Normal appearance      Spine:                  Normal appearance ---------------------------------------------------------------------- IMPRESSION:  Thank you for referring your patient for a follow up ultrasound  to assess fetal growth due to current admission to  [facility]  due to preeclampsia.  She is currently  receiving magnesium sulfate for seizure prophylaxis while  undergiong observation.  The patient is known to me from her  previous detailed ultrasound performed at [facility]  [place] for BMI >30.  Ultrasound demonstrates is a singleton gestation at 31  weeks.  Dating is by ultrasound performed at [facility]  on 01/28/18; measurements were consistent with [redacted] weeks  gestation.  The estimated fetal weight is 1593g (29%). The amniotic fluid  volume is normal and active fetal movements were seen.  These findings were  reveiwed with the patient today.  I  reassured her that the ultrasound demonstrated appropriate  fetal growth and the significance of appropriate fetal growth in  the setting of preeclampsia.  I told her we would not manage  her as an outpatient if she had fetal growth restriction.  I also  briefly reviewed concerns related to preeclampsia in  pregnancy---including risks for maternal stroke,  HELLP/liver/kidney disfunction and seizures (we readdressed  the  reason she was currently on magnesium sulfate and the  possiblity she would have it discontinued after 24 hours or a  period of observation if she continued under expectant  management) in the setting of severe preeclampsia. I  explained why her medical providers were being very  cautious about her inpatient evaluation and management.  We discussed the only treatment for preeclampsia as delivery  but the careful balance between delivery for maternal  indications and the risks associated with prematurity.  I  explained how providers would observe for any evidence of  worsening disease in her (severe range bps, neurologic  symptoms, evidence of HELLP) as a sign that delivery may  need to occur prior to 34 weeks (if severe disease) or 37  weeks if mild preeclampsia.  I tried to impress upon her the  seriousness of the diagnosis but the safety of current, careful,  expectant management.  Thank you for allowing [place] to participate in your patient's care.  Please do not hesitate to contact us if we can be of further  assistance. ----------------------------------------------------------------------                   Manfred Shirts, MD Electronically Signed Final Report   07/22/2018 03:54 pm ----------------------------------------------------------------------    Assessment:  Cindy Hays is a 37 y.o. [id number] female at [redacted]w[redacted]d with chronic hypertension with superimposed preeclampsia.   Plan:  1. Admit to Labor & Delivery; consents reviewed and obtained  2. Fetal  Well being  - Fetal Tracing: category I - GBS unknown, culture ordered to be collected  3. Routine OB: - Prenatal labs reviewed, as above - Rh positive - Prenatal vitamin - Regular diet - Saline lock  4. Superimposed preeclampsia: - S/p course of magnesium sulfate from 07/21/18 to 07/23/18  - Continue PO nifedipine XR 90mg /day - IV labetalol PRN for severe range pressures, PO labetalol 200mg  BID ordered to start tonight - Strict Is & Os - CBC, CMP, uric acid, and P/C ratio ordered now   5. Fetal well-being: - Continuous EFM - Betamethasone course completed 07/21/18 and 07/22/18  6. Anemia: - Continue prenatal vitamin and PO iron supplement - S/p transfusion of 1 unit PRBCs on 07/22/18 - Monitor hemoglobin and hematocrit   Discussed patient with Dr. Larey Days, who is in agreement with this plan of care.    Lisette Grinder, CNM 07/25/2018 10:51 PM ----- Lisette Grinder Certified Nurse Midwife [facility], Department of [facility]

## 2018-07-26 ENCOUNTER — Other Ambulatory Visit: Payer: Self-pay

## 2018-07-26 LAB — COMPREHENSIVE METABOLIC PANEL
ALT: 27 U/L (ref 0–44)
ANION GAP: 5 (ref 5–15)
AST: 32 U/L (ref 15–41)
Albumin: 2.6 g/dL — ABNORMAL LOW (ref 3.5–5.0)
Alkaline Phosphatase: 175 U/L — ABNORMAL HIGH (ref 38–126)
BUN: 10 mg/dL (ref 6–20)
CHLORIDE: 107 mmol/L (ref 98–111)
CO2: 22 mmol/L (ref 22–32)
Calcium: 8.5 mg/dL — ABNORMAL LOW (ref 8.9–10.3)
Creatinine, Ser: 0.5 mg/dL (ref 0.44–1.00)
Glucose, Bld: 98 mg/dL (ref 70–99)
POTASSIUM: 3.7 mmol/L (ref 3.5–5.1)
SODIUM: 134 mmol/L — AB (ref 135–145)
Total Bilirubin: 0.5 mg/dL (ref 0.3–1.2)
Total Protein: 6.5 g/dL (ref 6.5–8.1)

## 2018-07-26 LAB — CBC WITH DIFFERENTIAL/PLATELET
Basophils Absolute: 0 10*3/uL (ref 0–0.1)
Basophils Relative: 0 %
EOS ABS: 0.1 10*3/uL (ref 0–0.7)
Eosinophils Relative: 1 %
HCT: 27.2 % — ABNORMAL LOW (ref 35.0–47.0)
Hemoglobin: 8.9 g/dL — ABNORMAL LOW (ref 12.0–16.0)
Lymphocytes Relative: 36 %
Lymphs Abs: 3.4 10*3/uL (ref 1.0–3.6)
MCH: 24 pg — AB (ref 26.0–34.0)
MCHC: 32.7 g/dL (ref 32.0–36.0)
MCV: 73.5 fL — AB (ref 80.0–100.0)
MONO ABS: 0.6 10*3/uL (ref 0.2–0.9)
Monocytes Relative: 6 %
NEUTROS ABS: 5.4 10*3/uL (ref 1.4–6.5)
Neutrophils Relative %: 57 %
PLATELETS: 286 10*3/uL (ref 150–440)
RBC: 3.7 MIL/uL — ABNORMAL LOW (ref 3.80–5.20)
RDW: 21.5 % — AB (ref 11.5–14.5)
WBC: 9.5 10*3/uL (ref 3.6–11.0)

## 2018-07-26 LAB — GROUP B STREP BY PCR: GROUP B STREP BY PCR: NEGATIVE

## 2018-07-26 MED ORDER — SODIUM CHLORIDE FLUSH 0.9 % IV SOLN
INTRAVENOUS | Status: AC
  Filled 2018-07-26: qty 10

## 2018-07-26 MED ORDER — FERROUS SULFATE 220 (44 FE) MG/5ML PO ELIX
300.0000 mg | ORAL_SOLUTION | Freq: Two times a day (BID) | ORAL | Status: DC
  Administered 2018-07-26 – 2018-07-29 (×8): 300 mg via ORAL
  Filled 2018-07-26 (×10): qty 6.9

## 2018-07-26 NOTE — Progress Notes (Signed)
Pt transported via wheelchair to [facility] for MFM consult. VSS prior to transport. CNM notified and aware of plan

## 2018-07-26 NOTE — Progress Notes (Signed)
[facility] Maternal-Fetal Medicine Consultation   Chief Complaint: preeclampsia   HPI: Ms. Cindy Hays is a 37 y.o. [phone] ( parity incorrect in [place]- prior PPROM and delivery at 32 weeks ,  IUFD at 54 weeks  ) at [redacted]w[redacted]d  who presents in consultation from  Dr Leonides Schanz for severe preeclampsia  requiring re-admission.  Pt works at [employer] as a Education officer, community. This pregnancy and the last wis with a different father from the other 4.  Pt notes that most of her pregnancies had blood pressure complications except for her last loss in December at 26 weeks. She says Dr Leonides Schanz started her on BP medicine procardia XL 30 earlier this summer that was working well for her. At the end of august her BP started going up - she was increased to 60XL . She was admitted to L&D on 9/4 with superimposed preeclampsia with new proteinuria . Procardia was increased to 90 and mag administered and BTM given 9/4, 9/5 . Pt saw Dr Diamantina Monks and was noted to have a normally grown fetus on 9/5 . The patient was concerned for her 4 kids 17,14,11, 5 and went home . She was doing well until she had a sensation of her "eye popping" while looking up and noted her BP was 170/111 at home initial BP was 160/94 here and normalized quickly - she is now on a second agent labetalol 200 mg bid . LFTs are normal and her platelets are normal  .  She feels much better on second agent.   Fetus was well grown on 07/22/18 at  29th percentile cephalic with normal fluid.  Other pregnancy complications include: 1- H/O IUFD 12 /2018 at 26 weeks - no genetic testing or atuopsy doen . Antiphospholipid antibody testing was negative.   2-severe anemia with hgb 7.8/hct 25%- she received blood 9/4 and last hgb was 9.2/ 29.3%  3- h/o  HTN ?  h/o preex in past pregnancies , pt feels like it has been normal outside of pregnancy  4 -Mild increase in TSH noted on 07/25/18 of 4 . 5 h/o asthma on albuterol 6 adv maternal age - neg cell free Dna    Past  Medical History: Patient  has a past medical history of Anemia and Hypertension.  Past Surgical History: She  has a past surgical history that includes No past surgeries.  Obstetric History: G6 P3204   Gynecologic History:  Patient's last menstrual period was 11/30/2017.  Medications: On procardia 90 XL  labetaolol 200 bid added at admission last night  Allergies: Patient is allergic to amoxicillin and vicodin [hydrocodone-acetaminophen].  Social History: Patient  reports that she has never smoked. She has never used smokeless tobacco. She reports that she does not drink alcohol or use drugs.  Family History: family history includes Hypertension in her mother.  Review of Systems A full 12 point review of systems was negative or as noted in the History of Present Illness.  Physical Exam: BP (!) 146/86 (BP Location: Left Arm)   Pulse 77   Temp 98.2 F (36.8 C) (Oral)   Resp 17   Ht 5\' 2"  (1.575 m)   Wt 92.1 kg   LMP 11/30/2017 Comment: irregular periods  BMI 37.13 kg/m   Mild facial edema  Gravid abd  Asessement: iUP at 31 4/7  Severe preeclampsia by BP, resolved neurologic sxs- BP controlled by second agent . Fetus well grown  Anemia  Obesity Asthma   Plan: I spent 30 minutes with  the patient discussing the difficulties in expectant management of severe preex . I reviewed the risks to her health of stroke, seizure and end organ damage and risks to the fetus of abruption and prematurity .  I told her that our recommendations in her situation would be inpatient management with day to day decision making regarding the safety for her and the fetus of expectant management with severe preeclampsia with induction of labor at 34 weeks.  Patient is very concerned about her kids at home her 82 yo can drive and help but the pt's mother and grandmother also require her assistance. The father of her kids at home is not involved day to day and she is reluctant to have the father of this  pregnancy assist with the kids due to "different parenting styles" .  I offered to give her a note for work and I encouraged her to identify help with her other kids .  If she requires an increase of the second BP agent, if her platelets drop or her LFTS or creat increase move to delivery  .Otherwise  Induce at 34 weeks.  If she signs out AMA - then I recommend daily NST BP checks and q 3 d labs.  Expectant management of severe preeclampsia is ultimately done with the patient's permission- hopefully she can work out arrangements with her household. Keeping the neonatologist in the loop may be helpful- If we can be of assistance at [facility] call [phone] and ask for MFM on call or call L&D [phone] and ask for MFM.      Total time spent with the patient was 30 minutes with greater than 50% spent in counseling and coordination of care. We appreciate this interesting consult and will be happy to be involved in the ongoing care of Ms. Sweetser in anyway her obstetricians desire.  Gatha Mayer , [facility]

## 2018-07-26 NOTE — Progress Notes (Signed)
Labor Progress Note  Cindy Hays is a 37 y.o. [id number] at [redacted]w[redacted]d by [redacted]w[redacted]d ultrasound admitted for [facility] with superimposed pre-eclampsia.   Subjective: denies HA, VD or RUQ pain. Denies UCs, VB or LOF. + FM noted by pt.   Objective: BP (!) 153/90 (BP Location: Left Arm)   Pulse 74   Temp 98.5 F (36.9 C) (Oral)   Resp 18   Ht 5\' 2"  (1.575 m)   Wt 92.1 kg   LMP 11/30/2017 Comment: irregular periods  BMI 37.13 kg/m  Notable VS details: reviewed, mild range BPs noted.   Fetal Assessment: FHT:  FHR: 130 bpm, variability: moderate,  accelerations:  Present,  decelerations:  Absent Category/reactivity:  Category I UC:   none SVE:   deferred Membrane status: intact  Labs: Lab Results  Component Value Date   WBC 9.5 07/26/2018   HGB 8.9 (L) 07/26/2018   HCT 27.2 (L) 07/26/2018   MCV 73.5 (L) 07/26/2018   PLT 286 07/26/2018  CMP this am: reviewed, stable.   Assessment / Plan: G6 P3204 at [redacted]w[redacted]d with CHTN superimposed Pre-e - Pre-e labs stable, no changes since admission.  - MFM consult done today: POC-->" If she requires an increase of the second BP agent, if her platelets drop or her LFTS or creat increase move to delivery.Otherwise  Induce at 34 weeks.  If she signs out AMA - then I recommend daily NST BP checks and q 3 d labs." - s/p Mag sulfate and BMZ last week.  - Growth [place] last week.   Cindy Hays, [place], CNM 07/26/2018, 12:06 PM

## 2018-07-28 DIAGNOSIS — O1493 Unspecified pre-eclampsia, third trimester: Secondary | ICD-10-CM

## 2018-07-28 DIAGNOSIS — O99013 Anemia complicating pregnancy, third trimester: Secondary | ICD-10-CM

## 2018-07-28 DIAGNOSIS — Z885 Allergy status to narcotic agent status: Secondary | ICD-10-CM

## 2018-07-28 DIAGNOSIS — Z3A32 32 weeks gestation of pregnancy: Secondary | ICD-10-CM

## 2018-07-28 DIAGNOSIS — Z8249 Family history of ischemic heart disease and other diseases of the circulatory system: Secondary | ICD-10-CM

## 2018-07-28 DIAGNOSIS — Z881 Allergy status to other antibiotic agents status: Secondary | ICD-10-CM

## 2018-07-28 LAB — CBC
HEMATOCRIT: 32.5 % — AB (ref 35.0–47.0)
HEMOGLOBIN: 10.2 g/dL — AB (ref 12.0–16.0)
MCH: 23.3 pg — ABNORMAL LOW (ref 26.0–34.0)
MCHC: 31.3 g/dL — AB (ref 32.0–36.0)
MCV: 74.5 fL — ABNORMAL LOW (ref 80.0–100.0)
Platelets: 324 10*3/uL (ref 150–440)
RBC: 4.37 MIL/uL (ref 3.80–5.20)
RDW: 21.6 % — ABNORMAL HIGH (ref 11.5–14.5)
WBC: 10.3 10*3/uL (ref 3.6–11.0)

## 2018-07-28 LAB — COMPREHENSIVE METABOLIC PANEL
ALBUMIN: 2.7 g/dL — AB (ref 3.5–5.0)
ALK PHOS: 206 U/L — AB (ref 38–126)
ALT: 17 U/L (ref 0–44)
ANION GAP: 7 (ref 5–15)
AST: 21 U/L (ref 15–41)
BILIRUBIN TOTAL: 0.4 mg/dL (ref 0.3–1.2)
BUN: 9 mg/dL (ref 6–20)
CALCIUM: 9 mg/dL (ref 8.9–10.3)
CO2: 22 mmol/L (ref 22–32)
Chloride: 107 mmol/L (ref 98–111)
Creatinine, Ser: 0.58 mg/dL (ref 0.44–1.00)
GFR calc Af Amer: >60 mL/min (ref >60)
GFR calc non Af Amer: >60 mL/min (ref >60)
GLUCOSE: 96 mg/dL (ref 70–99)
Potassium: 4.1 mmol/L (ref 3.5–5.1)
Sodium: 136 mmol/L (ref 135–145)
Total Protein: 7.5 g/dL (ref 6.5–8.1)

## 2018-07-28 MED ORDER — POLYETHYLENE GLYCOL 3350 17 G PO PACK
17.0000 g | PACK | Freq: Every day | ORAL | Status: DC | PRN
  Administered 2018-07-28: 17 g via ORAL
  Filled 2018-07-28 (×2): qty 1

## 2018-07-28 NOTE — Progress Notes (Signed)
Labor Progress Note  Cindy Hays is a 37 y.o. [id number] at [redacted]w[redacted]d by [redacted]w[redacted]d ultrasound admitted for [facility] with superimposed pre-eclampsia.   Subjective: denies HA, VD or RUQ pain. Denies UCs, VB or LOF. + FM noted by pt.   Objective: BP 118/75   Pulse 83   Temp 98.3 F (36.8 C) (Oral)   Resp 20   Ht 5\' 2"  (1.575 m)   Wt 92.1 kg   LMP 11/30/2017 Comment: irregular periods  SpO2 100%   BMI 37.13 kg/m  Notable VS details: reviewed, mild range BPs noted.   Fetal Assessment: FHT:  FHR: 130 bpm, variability: moderate,  accelerations:  Present,  decelerations:  Absent Category/reactivity:  Category I UC:   none SVE:   deferred Membrane status: intact  Labs: Lab Results  Component Value Date   WBC 9.5 07/26/2018   HGB 8.9 (L) 07/26/2018   HCT 27.2 (L) 07/26/2018   MCV 73.5 (L) 07/26/2018   PLT 286 07/26/2018  CMP this am: reviewed, stable.   Assessment / Plan: G6 P3204 at [redacted]w[redacted]d with CHTN superimposed Pre-e - Pre-e labs stable, no changes since admission.  - MFM:  If she requires an increase of the second BP agent, if her platelets drop or her LFTS or creat increase move to delivery.Otherwise  Induce at 34 weeks.  - s/p Mag sulfate and BMZ last week.  - Growth [place] last week.  -having decreased fetal movement, NST ok with 10x10s @ 31wks.  BPP performed at bedside. +2 gross movement x 3 +2 fine movement flex/extension +2 respiratory +2 2x2 pocket Amniotic fluid  Social:  Patient's son is enrolled in kindergarten and the school needs a copy of his birth certificate to be delivered to the school.  As she is inpatient she is unable to comply with this request.  She asks for assistance.  She will be contacting the school to explain her situation.  Will do what we can to assist.  -anemia: FeSO4 BID.  Was supposed to have heme consult as outpatient, now as inpatient, we should have this done.  on 8/30: Ferritin 6 , TIBC 602, Transferrin 430. Hb 7/3 on 8/30,  7.7 on 9/4.  Had blood  transfusion on 9/4, 1uPRBC Hb now 8.9.  Consult ordered.   Continue inpatient admission until delivery.  ----- Larey Days, MD Attending Obstetrician and Gynecologist [facility], Department of [facility]

## 2018-07-28 NOTE — Progress Notes (Signed)
Subjective:31+6 weeks . Severe preeclampsia . Currently no cns symptoms . No c/o . Good fetal movt  Dr Leonides Schanz did a BPP yesterday */10  Patient reports  No c/o   Objective: I have reviewed patient's vital signs and labs.  General: alert and cooperative Resp: clear to auscultation bilaterally Cardio: regular rate and rhythm, S1, S2 normal, no murmur, click, rub or gallop GI: gravid , non tender    Assessment/Plan: Preeclampsia with history of severe features , currently stable without severe features  Continue daily eval , nst . If clinically worsens with severe criteria then will move torwards delivery . Otherwise goal is to get her to 34+0 week before induction .   LOS: 3 days    Gwen Her Schermerhorn 07/28/2018, 9:16 AM

## 2018-07-28 NOTE — Progress Notes (Signed)
Hematology consult called to Dr. Kennith Gain

## 2018-07-28 NOTE — Progress Notes (Signed)
Pt upset and tearful.  "my kids aren't coping with me being in hospital"  "it is making me stressed out d/t my home situation"  "Can you please call Dr. Leonides Schanz and tell her or ask her to call me".  Dr. Leonides Schanz paged.

## 2018-07-28 NOTE — Consult Note (Signed)
[place]  [phone] [phone]  ID: Cindy Hays OB: [birth year]  MR#: [id number]  MOQ#:[id number]  Patient Care Team: Ward, Honor Loh, MD as PCP - General (Obstetrics and Gynecology)  CHIEF COMPLAINT: Anemia in third trimester pregnancy.  INTERVAL HISTORY: Patient is a 37 year old female who is approximately [redacted] weeks pregnant admitted to the hospital with preeclampsia.  She is noted to have significant anemia and has already received 1 unit of packed red blood cells.  Currently patient feels well.  She has no neurologic complaints.  She denies any recent fevers.  She has a good appetite and is gaining weight appropriately.  She denies any chest pain or shortness of breath.  She has no nausea, vomiting, constipation, or diarrhea.  She has no urinary complaints.  Patient offers no further specific complaints today.  REVIEW OF SYSTEMS:   Review of Systems  Constitutional: Negative.  Negative for fever, malaise/fatigue and weight loss.  Respiratory: Negative.  Negative for cough, hemoptysis and shortness of breath.   Cardiovascular: Negative.  Negative for chest pain and leg swelling.  Gastrointestinal: Negative.  Negative for abdominal pain, blood in stool and melena.  Genitourinary: Negative.  Negative for dysuria and hematuria.  Musculoskeletal: Negative.  Negative for back pain.  Skin: Negative.  Negative for rash.  Neurological: Negative.  Negative for dizziness, focal weakness, weakness and headaches.  Psychiatric/Behavioral: The patient is nervous/anxious.     As per HPI. Otherwise, a complete review of systems is negative.  PAST MEDICAL HISTORY: Past Medical History:  Diagnosis Date  . Anemia   . Hypertension     PAST SURGICAL HISTORY: Past Surgical History:  Procedure Laterality Date  . NO PAST SURGERIES      FAMILY HISTORY: Family History  Problem Relation Age of Onset  . Hypertension Mother     ADVANCED DIRECTIVES  (Y/N):  @ADVDIR @  HEALTH MAINTENANCE: Social History   Tobacco Use  . Smoking status: Never Smoker  . Smokeless tobacco: Never Used  Substance Use Topics  . Alcohol use: No  . Drug use: No     Colonoscopy:  PAP:  Bone density:  Lipid panel:  Allergies  Allergen Reactions  . Amoxicillin Rash  . Vicodin [Hydrocodone-Acetaminophen] Rash    Current Facility-Administered Medications  Medication Dose Route Frequency Provider Last Rate Last Dose  . acetaminophen (TYLENOL) tablet 650 mg  650 mg Oral Q4H PRN Lisette Grinder, CNM   650 mg at 07/27/18 2225  . calcium carbonate (TUMS - dosed in mg elemental calcium) chewable tablet 400 mg of elemental calcium  2 tablet Oral Q4H PRN Lisette Grinder, CNM      . docusate sodium (COLACE) capsule 100 mg  100 mg Oral Daily Lisette Grinder, CNM   100 mg at 07/28/18 1012  . ferrous sulfate 220 (44 Fe) MG/5ML solution 300 mg  300 mg Oral BID WC Lisette Grinder, CNM   300 mg at 07/28/18 1709  . labetalol (NORMODYNE,TRANDATE) injection 20 mg  20 mg Intravenous PRN Lisette Grinder, CNM   20 mg at 07/25/18 2210   And  . labetalol (NORMODYNE,TRANDATE) injection 40 mg  40 mg Intravenous PRN Lisette Grinder, CNM       And  . labetalol (NORMODYNE,TRANDATE) injection 80 mg  80 mg Intravenous PRN Lisette Grinder, CNM       And  . hydrALAZINE (APRESOLINE) injection 10 mg  10 mg Intravenous PRN Lisette Grinder, CNM      . labetalol (NORMODYNE) tablet 200 mg  200 mg Oral BID Lisette Grinder, CNM   200 mg at 07/28/18 1012  . NIFEdipine (PROCARDIA-XL/ADALAT-CC/NIFEDICAL-XL) 24 hr tablet 90 mg  90 mg Oral Daily Lisette Grinder, CNM   90 mg at 07/28/18 1011  . polyethylene glycol (MIRALAX / GLYCOLAX) packet 17 g  17 g Oral Daily PRN Schermerhorn, Gwen Her, MD   17 g at 07/28/18 2040  . prenatal multivitamin tablet 1 tablet  1 tablet Oral Q1200 Lisette Grinder, CNM   1 tablet at 07/28/18 1306    OBJECTIVE: Vitals:   07/28/18  1945 07/28/18 2216  BP: 129/82 115/66  Pulse: 91 86  Resp: 20 20  Temp: 98.6 F (37 C) 98.2 F (36.8 C)  SpO2: 97% 96%     Body mass index is 37.13 kg/m.    ECOG FS:0 - Asymptomatic  General: Well-developed, well-nourished, no acute distress. Eyes: Pink conjunctiva, anicteric sclera. HEENT: Normocephalic, moist mucous membranes, clear oropharnyx. Lungs: Clear to auscultation bilaterally. Heart: Regular rate and rhythm. No rubs, murmurs, or gallops. Abdomen: Appears appropriate for gestational age.  Who have Musculoskeletal: No edema, cyanosis, or clubbing. Neuro: Alert, answering all questions appropriately. Cranial nerves grossly intact. Skin: No rashes or petechiae noted. Psych: Normal affect. Lymphatics: No cervical, calvicular, axillary or inguinal LAD.   LAB RESULTS:  Lab Results  Component Value Date   NA 136 07/28/2018   K 4.1 07/28/2018   CL 107 07/28/2018   CO2 22 07/28/2018   GLUCOSE 96 07/28/2018   BUN 9 07/28/2018   CREATININE 0.58 07/28/2018   CALCIUM 9.0 07/28/2018   PROT 7.5 07/28/2018   ALBUMIN 2.7 (L) 07/28/2018   AST 21 07/28/2018   ALT 17 07/28/2018   ALKPHOS 206 (H) 07/28/2018   BILITOT 0.4 07/28/2018   GFRNONAA >60 07/28/2018   GFRAA >60 07/28/2018    Lab Results  Component Value Date   WBC 10.3 07/28/2018   NEUTROABS 5.4 07/26/2018   HGB 10.2 (L) 07/28/2018   HCT 32.5 (L) 07/28/2018   MCV 74.5 (L) 07/28/2018   PLT 324 07/28/2018     STUDIES: [place] Mfm Ob Follow Up  Result Date: 07/22/2018 ----------------------------------------------------------------------  OBSTETRICS REPORT                       (Signed Final 07/22/2018 03:54 pm) ---------------------------------------------------------------------- PATIENT INFO:  ID #:       [id number]                          D.O.B.:  November 27, 1980 (37 yrs)  Name:       Cindy Hays                 Visit Date: 07/22/2018 12:01 pm ----------------------------------------------------------------------  PERFORMED BY:  Performed By:     Zella Ball          Ref. Address:     [address]                    [place], [place],  [place] [address]  Referred By:      Boykin Nearing                    MD ---------------------------------------------------------------------- SERVICE(S) PROVIDED:   [place] MFM OB FOLLOW UP                                  [phone]  ---------------------------------------------------------------------- INDICATIONS:   [redacted] weeks gestation of pregnancy                Z3A.31  ---------------------------------------------------------------------- FETAL EVALUATION:  Num Of Fetuses:         1  Fetal Heart Rate(bpm):  112  Cardiac Activity:       Present  Presentation:           Cephalic  Placenta:               Anterior  AFI Sum(cm)     %Tile       Largest Pocket(cm)  12.2            32          4.13 ---------------------------------------------------------------------- BIOMETRY:  BPD:      74.9  mm     G. Age:  30w 0d         14  %    CI:        71.31   %    70 - 86                                                          FL/HC:      19.9   %    19.3 - 21.3  HC:      282.5  mm     G. Age:  31w 0d         16  %    HC/AC:      1.05        0.96 - 1.17  AC:      270.3  mm     G. Age:  31w 1d         50  %    FL/BPD:     75.2   %    71 - 87  FL:       56.3  mm     G. Age:  29w 4d          8  %    FL/AC:      20.8   %    20 - 24  HUM:      51.7  mm     G. Age:  30w 1d         35  %  Est. FW:    1593  gm      3 lb 8 oz     29  % ---------------------------------------------------------------------- GESTATIONAL AGE:  U/S Today:     30w 3d  EDD:   09/27/18  Best:          Burke Keels 0d     Det. ByLoman Chroman         EDD:   09/23/18                                      (01/28/18)  ---------------------------------------------------------------------- ANATOMY:  Ventricles:            Normal appearance      Stomach:                Seen  Choroid Plexus:        Within Normal Limits   Kidneys:                Normal appearance  Heart:                 4-Chamber view         Bladder:                Seen                         appears normal  LVOT:                  Normal appearance      Spine:                  Normal appearance ---------------------------------------------------------------------- IMPRESSION:  Thank you for referring your patient for a follow up ultrasound  to assess fetal growth due to current admission to  [facility]  due to preeclampsia.  She is currently  receiving magnesium sulfate for seizure prophylaxis while  undergiong observation.  The patient is known to me from her  previous detailed ultrasound performed at [facility] for BMI >30.  Ultrasound demonstrates is a singleton gestation at 31  weeks.  Dating is by ultrasound performed at [facility]  on 01/28/18; measurements were consistent with [redacted] weeks  gestation.  The estimated fetal weight is 1593g (29%). The amniotic fluid  volume is normal and active fetal movements were seen.  These findings were reveiwed with the patient today.  I  reassured her that the ultrasound demonstrated appropriate  fetal growth and the significance of appropriate fetal growth in  the setting of preeclampsia.  I told her we would not manage  her as an outpatient if she had fetal growth restriction.  I also  briefly reviewed concerns related to preeclampsia in  pregnancy---including risks for maternal stroke,  HELLP/liver/kidney disfunction and seizures (we readdressed  the  reason she was currently on magnesium sulfate and the  possiblity she would have it discontinued after 24 hours or a  period of observation if she continued under expectant  management) in the setting of severe preeclampsia. I  explained why her  medical providers were being very  cautious about her inpatient evaluation and management.  We discussed the only treatment for preeclampsia as delivery  but the careful balance between delivery for maternal  indications and the risks associated with prematurity.  I  explained how providers would observe for any evidence of  worsening disease in her (severe range bps, neurologic  symptoms, evidence of HELLP) as a sign that delivery may  need to occur prior to 34 weeks (if severe disease) or 37  weeks if mild  preeclampsia.  I tried to impress upon her the  seriousness of the diagnosis but the safety of current, careful,  expectant management.  Thank you for allowing [place] to participate in your patient's care.  Please do not hesitate to contact us if we can be of further  assistance. ----------------------------------------------------------------------                   Manfred Shirts, MD Electronically Signed Final Report   07/22/2018 03:54 pm ----------------------------------------------------------------------   ASSESSMENT: Anemia in third trimester pregnancy.  PLAN:    1. Anemia in third trimester pregnancy: Patient's hemoglobin has improved and is now 10.2.  She received 1 unit of packed red blood cells on July 22, 2018.  On July 16, 2018, she was noted to have a ferritin of 6.  Continue oral iron supplementation.  Given that her hemoglobin is greater than 10.0, she does not require additional blood transfusion or IV iron at this time.  If hemoglobin begins to trend down over the next several weeks prior to delivery, can then consider 510 mg IV Feraheme. 2.  Pregnancy: By report, patient is being induced in approximately 2 weeks at [redacted] weeks gestation. 3.  Disposition: If patient is discharged, she can follow-up in the cancer center in 1 week for laboratory work and consideration of Feraheme.  If she remains in the hospital, continue to monitor with daily CBC.  Appreciate consult, will  follow.   Lloyd Huger, MD   07/28/2018 10:59 PM

## 2018-07-28 NOTE — OB Triage Note (Signed)
Daily NST

## 2018-07-29 ENCOUNTER — Inpatient Hospital Stay: Payer: Medicaid Other

## 2018-07-29 LAB — CBC WITH DIFFERENTIAL/PLATELET
BASOS PCT: 3 %
Basophils Absolute: 0.2 10*3/uL — ABNORMAL HIGH (ref 0–0.1)
Eosinophils Absolute: 0.2 10*3/uL (ref 0–0.7)
Eosinophils Relative: 2 %
HEMATOCRIT: 29.8 % — AB (ref 35.0–47.0)
HEMOGLOBIN: 9.3 g/dL — AB (ref 12.0–16.0)
LYMPHS ABS: 2 10*3/uL (ref 1.0–3.6)
LYMPHS PCT: 21 %
MCH: 23.2 pg — AB (ref 26.0–34.0)
MCHC: 31.4 g/dL — AB (ref 32.0–36.0)
MCV: 73.9 fL — AB (ref 80.0–100.0)
MONO ABS: 0.7 10*3/uL (ref 0.2–0.9)
MONOS PCT: 7 %
NEUTROS ABS: 6.5 10*3/uL (ref 1.4–6.5)
NEUTROS PCT: 67 %
Platelets: 301 10*3/uL (ref 150–440)
RBC: 4.03 MIL/uL (ref 3.80–5.20)
RDW: 22.1 % — AB (ref 11.5–14.5)
WBC: 9.7 10*3/uL (ref 3.6–11.0)

## 2018-07-29 LAB — TYPE AND SCREEN
ABO/RH(D): A POS
ANTIBODY SCREEN: NEGATIVE
Unit division: 0
Unit division: 0

## 2018-07-29 LAB — COMPREHENSIVE METABOLIC PANEL
ALBUMIN: 2.5 g/dL — AB (ref 3.5–5.0)
ALK PHOS: 184 U/L — AB (ref 38–126)
ALT: 14 U/L (ref 0–44)
ANION GAP: 8 (ref 5–15)
AST: 18 U/L (ref 15–41)
BILIRUBIN TOTAL: 0.5 mg/dL (ref 0.3–1.2)
BUN: 13 mg/dL (ref 6–20)
CO2: 23 mmol/L (ref 22–32)
Calcium: 9.1 mg/dL (ref 8.9–10.3)
Chloride: 105 mmol/L (ref 98–111)
Creatinine, Ser: 0.54 mg/dL (ref 0.44–1.00)
GLUCOSE: 88 mg/dL (ref 70–99)
POTASSIUM: 4.3 mmol/L (ref 3.5–5.1)
Sodium: 136 mmol/L (ref 135–145)
TOTAL PROTEIN: 6.8 g/dL (ref 6.5–8.1)

## 2018-07-29 LAB — BPAM RBC
BLOOD PRODUCT EXPIRATION DATE: 201910042359
Blood Product Expiration Date: 201910042359
UNIT TYPE AND RH: 6200
Unit Type and Rh: 6200

## 2018-07-29 LAB — PROTEIN / CREATININE RATIO, URINE
Creatinine, Urine: 103 mg/dL
Protein Creatinine Ratio: 1.07 mg/mg{Cre} — ABNORMAL HIGH (ref 0.00–0.15)
TOTAL PROTEIN, URINE: 110 mg/dL

## 2018-07-29 MED ORDER — LABETALOL HCL 200 MG PO TABS: 400.0000 mg | ORAL_TABLET | Freq: Two times a day (BID) | ORAL | 0 refills | Status: DC

## 2018-07-29 NOTE — Progress Notes (Signed)
Vitals:   07/29/18 1037 07/29/18 1236  BP: 132/84 136/84  Pulse: 88 92  Resp: 18 18  Temp:  98.5 F (36.9 C)  SpO2: 98% 100%   Discussion with patient re: dx of preeclampsia with severe features at 32+0wks , s/p mag, bmz. She is absolutely unable to stay because she has no one to care for her 4 children at home. She is aware of risks to her and baby and is making the most rational choice in a bad situation that she can. She commits to come in for daily labs, BP check and NST in triage. She will do fetal movements daily. She knows the sx to watch for and will call 911 if needed. She will sign out ama

## 2018-07-29 NOTE — Progress Notes (Signed)
Dr. Leafy Ro back in room talking with patient. At this time patient understands the severity and risk of leaving the hospital but feels like she has to, to care for her children. Detailed education by Dr. Leafy Ro about s/s hypertension, how to take medications, how often she needs to be seen/when to come to hospital and check BP frequently/rest at home.

## 2018-07-29 NOTE — Progress Notes (Addendum)
Patient ID: Cindy Hays, female   DOB: 01-Apr-1981, 37 y.o.   MRN: [id number] S: I want to go home because I do not have anyone I can count on to take care of the kids, my 7 yo is in charge. My mom and I are not getting along. The FOB is not available to help 24/7.  Past Medical History:  Diagnosis Date  . Anemia   . Hypertension    Past Surgical History:  Procedure Laterality Date  . NO PAST SURGERIES     Social History   Socioeconomic History  . Marital status: Single    Spouse name: Not on file  . Number of children: Not on file  . Years of education: Not on file  . Highest education level: Not on file  Occupational History  . Not on file  Social Needs  . Financial resource strain: Not on file  . Food insecurity:    Worry: Not on file    Inability: Not on file  . Transportation needs:    Medical: Not on file    Non-medical: Not on file  Tobacco Use  . Smoking status: Never Smoker  . Smokeless tobacco: Never Used  Substance and Sexual Activity  . Alcohol use: No  . Drug use: No  . Sexual activity: Not Currently    Birth control/protection: Surgical    Comment: Tubal ligation  Lifestyle  . Physical activity:    Days per week: Not on file    Minutes per session: Not on file  . Stress: Not on file  Relationships  . Social connections:    Talks on phone: Not on file    Gets together: Not on file    Attends religious service: Not on file    Active member of club or organization: Not on file    Attends meetings of clubs or organizations: Not on file    Relationship status: Not on file  . Intimate partner violence:    Fear of current or ex partner: Not on file    Emotionally abused: Not on file    Physically abused: Not on file    Forced sexual activity: Not on file  Other Topics Concern  . Not on file  Social History Narrative  . Not on file   Family History  Problem Relation Age of Onset  . Hypertension Mother    Vitals:   07/29/18 0252 07/29/18 0809   BP: 132/84 125/85  Pulse: 84 81  Resp: 18 20  Temp: 97.9 F (36.6 C) 98.1 F (36.7 C)  SpO2: 97% 100%  Gen:A,A&Ox3 HEENT: Normocephalic, Eyes non-icteric. HEART:S1S2, RRR, No M/R/G LUNGS:CTA bilat, no W/R/R ABD: Gravid Extrems:warm, dry, NT, Neg Homan's A:1. CHTN with superimposed pre-ecclampsia  2. ON Nifedipine and Labetalol added and improving 3. Proteinuria 2 gms 5 days ago P:1. Pt has no severe features of HA, RUQ pain, or vision issues. 2. Pt wants to go home, ADvised she cannot be discharged till delivered due to  Impending seizure. 3. NST's as scheduled. 4. Dr Leafy Ro updated and agrees with plan of care Pt considering signing out AMA as she has no one to look after her kids __________________________________ Danford Bad, MSN, CNM, FNP Certified Nurse Midwife [facility]

## 2018-07-29 NOTE — Progress Notes (Signed)
   07/29/18 1315  Clinical Encounter Type  Visited With Patient  Visit Type Initial   Chaplain made introductory visit; patient expressed that she had no needs at present.  Chaplain encouraged patient to have staff page for chaplain if needs change.
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
## 2018-07-29 NOTE — Progress Notes (Signed)
Patient signed AMA papers. Pt understands s/s of high blood pressure, labor, etc and when to call 911. Pt understands how to take BP medications. Patient scheduled at 11 am for NST, labs, and BP check tomorrow in [facility] triage.

## 2018-07-30 ENCOUNTER — Inpatient Hospital Stay: Payer: Medicaid Other

## 2018-07-30 ENCOUNTER — Observation Stay
Admission: RE | Admit: 2018-07-30 | Discharge: 2018-07-30 | Disposition: A | Discharge status: Home / Self Care | Payer: Medicaid Other | Attending: Obstetrics and Gynecology | Admitting: Obstetrics and Gynecology

## 2018-07-30 ENCOUNTER — Encounter: Payer: Self-pay | Admitting: *Deleted

## 2018-07-30 ENCOUNTER — Other Ambulatory Visit: Payer: Self-pay

## 2018-07-30 DIAGNOSIS — Z885 Allergy status to narcotic agent status: Secondary | ICD-10-CM | POA: Diagnosis not present

## 2018-07-30 DIAGNOSIS — O1493 Unspecified pre-eclampsia, third trimester: Secondary | ICD-10-CM | POA: Insufficient documentation

## 2018-07-30 DIAGNOSIS — Z3689 Encounter for other specified antenatal screening: Principal | ICD-10-CM | POA: Insufficient documentation

## 2018-07-30 DIAGNOSIS — Z7982 Long term (current) use of aspirin: Secondary | ICD-10-CM | POA: Diagnosis not present

## 2018-07-30 DIAGNOSIS — Z880 Allergy status to penicillin: Secondary | ICD-10-CM | POA: Diagnosis not present

## 2018-07-30 DIAGNOSIS — O163 Unspecified maternal hypertension, third trimester: Secondary | ICD-10-CM | POA: Diagnosis not present

## 2018-07-30 DIAGNOSIS — O10019 Pre-existing essential hypertension complicating pregnancy, unspecified trimester: Secondary | ICD-10-CM

## 2018-07-30 DIAGNOSIS — Z3A37 37 weeks gestation of pregnancy: Secondary | ICD-10-CM | POA: Insufficient documentation

## 2018-07-30 DIAGNOSIS — O119 Pre-existing hypertension with pre-eclampsia, unspecified trimester: Secondary | ICD-10-CM | POA: Diagnosis present

## 2018-07-30 LAB — CBC
HCT: 27.4 % — ABNORMAL LOW (ref 35.0–47.0)
HEMOGLOBIN: 8.9 g/dL — AB (ref 12.0–16.0)
MCH: 24.3 pg — ABNORMAL LOW (ref 26.0–34.0)
MCHC: 32.5 g/dL (ref 32.0–36.0)
MCV: 74.9 fL — ABNORMAL LOW (ref 80.0–100.0)
Platelets: 281 10*3/uL (ref 150–440)
RBC: 3.66 MIL/uL — AB (ref 3.80–5.20)
RDW: 22 % — ABNORMAL HIGH (ref 11.5–14.5)
WBC: 8.5 10*3/uL (ref 3.6–11.0)

## 2018-07-30 LAB — COMPREHENSIVE METABOLIC PANEL
ALBUMIN: 2.5 g/dL — AB (ref 3.5–5.0)
ALT: 17 U/L (ref 0–44)
AST: 23 U/L (ref 15–41)
Alkaline Phosphatase: 174 U/L — ABNORMAL HIGH (ref 38–126)
Anion gap: 6 (ref 5–15)
BUN: 15 mg/dL (ref 6–20)
CO2: 23 mmol/L (ref 22–32)
CREATININE: 0.78 mg/dL (ref 0.44–1.00)
Calcium: 8.7 mg/dL — ABNORMAL LOW (ref 8.9–10.3)
Chloride: 106 mmol/L (ref 98–111)
GFR calc Af Amer: >60 mL/min (ref >60)
GFR calc non Af Amer: >60 mL/min (ref >60)
GLUCOSE: 81 mg/dL (ref 70–99)
Potassium: 4.3 mmol/L (ref 3.5–5.1)
SODIUM: 135 mmol/L (ref 135–145)
Total Bilirubin: 0.5 mg/dL (ref 0.3–1.2)
Total Protein: 6.7 g/dL (ref 6.5–8.1)

## 2018-07-30 LAB — PROTEIN / CREATININE RATIO, URINE
Creatinine, Urine: 128 mg/dL
PROTEIN CREATININE RATIO: 0.91 mg/mg{creat} — AB (ref 0.00–0.15)
Total Protein, Urine: 117 mg/dL

## 2018-07-30 MED ORDER — ACETAMINOPHEN 500 MG PO TABS
ORAL_TABLET | ORAL | Status: AC
  Administered 2018-07-30: 1000 mg
  Filled 2018-07-30: qty 2

## 2018-07-30 MED ORDER — VALACYCLOVIR HCL 500 MG PO TABS: 500.0000 mg | ORAL_TABLET | Freq: Two times a day (BID) | ORAL | 0 refills | Status: DC

## 2018-07-30 NOTE — OB Triage Note (Signed)
Scheduled NST today. Cindy Hays

## 2018-07-30 NOTE — Discharge Summary (Signed)
Cindy Hays is a 37 y.o. female. She is at [redacted]w[redacted]d gestation. Patient's last menstrual period was 11/30/2017. Estimated Date of Delivery: 09/23/18  Prenatal care site: [facility]   Current pregnancy complicated by: [facility] with superimposed Pre-e  Chief complaint: mild HA that resolved after having some juice and water. Has not eaten since early breakfast.    S: Resting comfortably. no CTX, no VB.no LOF,  Active fetal movement. Denies: visual changes, SOB, or RUQ/epigastric pain  Maternal Medical History:   Past Medical History:  Diagnosis Date  . Anemia   . Hypertension     Past Surgical History:  Procedure Laterality Date  . NO PAST SURGERIES      Allergies  Allergen Reactions  . Amoxicillin Rash  . Vicodin [Hydrocodone-Acetaminophen] Rash    Prior to Admission medications   Medication Sig Start Date End Date Taking? Authorizing Provider  aspirin 81 MG chewable tablet Chew by mouth daily.   Yes [provider]  ferrous sulfate 220 (44 Fe) MG/5ML solution Take 220 mg by mouth daily.   Yes [provider]  labetalol (NORMODYNE) 200 MG tablet Take 2 tablets (400 mg total) by mouth 2 (two) times daily. 07/29/18  Yes Benjaman Kindler, MD  NIFEdipine (PROCARDIA XL/ADALAT-CC) 90 MG 24 hr tablet Take 1 tablet (90 mg total) by mouth daily. 07/24/18  Yes Benjaman Kindler, MD  Prenatal Vit-Fe Fumarate-FA (PRENATAL MULTIVITAMIN) TABS tablet Take 1 tablet by mouth daily at 12 noon.   Yes [provider]  acyclovir (ZOVIRAX) 800 MG tablet Take 1 tablet (800 mg total) by mouth 5 (five) times daily. Patient not taking: Reported on 07/30/2018 04/07/18   Menshew, Dannielle Karvonen, PA-C  albuterol (PROVENTIL HFA;VENTOLIN HFA) 108 (90 Base) MCG/ACT inhaler Inhale 2 puffs into the lungs every 4 (four) hours as needed for wheezing. Patient not taking: Reported on 07/30/2018 12/30/17   Marylene Land, NP      Social History: She  reports that she has never  smoked. She has never used smokeless tobacco. She reports that she does not drink alcohol or use drugs.  Family History: family history includes Hypertension in her mother.   Review of Systems: A full review of systems was performed and negative except as noted in the HPI.     O:  BP 129/77   Pulse 91   Temp 98 F (36.7 C) (Oral)   Resp 16   Ht 5\' 2"  (1.575 m)   Wt 92.5 kg   LMP 11/30/2017 Comment: irregular periods  BMI 37.31 kg/m  Results for orders placed or performed during the hospital encounter of 07/30/18 (from the past 48 hour(s))  Protein / creatinine ratio, urine   Collection Time: 07/30/18 12:24 PM  Result Value Ref Range   Creatinine, Urine 128 mg/dL   Total Protein, Urine 117 mg/dL   Protein Creatinine Ratio 0.91 (H) 0.00 - 0.15 mg/mg[Cre]  CBC   Collection Time: 07/30/18 12:27 PM  Result Value Ref Range   WBC 8.5 3.6 - 11.0 K/uL   RBC 3.66 (L) 3.80 - 5.20 MIL/uL   Hemoglobin 8.9 (L) 12.0 - 16.0 g/dL   HCT 27.4 (L) 35.0 - 47.0 %   MCV 74.9 (L) 80.0 - 100.0 fL   MCH 24.3 (L) 26.0 - 34.0 pg   MCHC 32.5 32.0 - 36.0 g/dL   RDW 22.0 (H) 11.5 - 14.5 %   Platelets 281 150 - 440 K/uL  Comprehensive metabolic panel   Collection Time: 07/30/18 12:27 PM  Result Value Ref Range   Sodium 135 135 - 145 mmol/L   Potassium 4.3 3.5 - 5.1 mmol/L   Chloride 106 98 - 111 mmol/L   CO2 23 22 - 32 mmol/L   Glucose, Bld 81 70 - 99 mg/dL   BUN 15 6 - 20 mg/dL   Creatinine, Ser 0.78 0.44 - 1.00 mg/dL   Calcium 8.7 (L) 8.9 - 10.3 mg/dL   Total Protein 6.7 6.5 - 8.1 g/dL   Albumin 2.5 (L) 3.5 - 5.0 g/dL   AST 23 15 - 41 U/L   ALT 17 0 - 44 U/L   Alkaline Phosphatase 174 (H) 38 - 126 U/L   Total Bilirubin 0.5 0.3 - 1.2 mg/dL   GFR calc non Af Amer >60 >60 mL/min   GFR calc Af Amer >60 >60 mL/min   Anion gap 6 5 - 15  Results for orders placed or performed during the hospital encounter of 07/25/18 (from the past 48 hour(s))  CBC with Differential/Platelet   Collection Time:  07/29/18  5:54 AM  Result Value Ref Range   WBC 9.7 3.6 - 11.0 K/uL   RBC 4.03 3.80 - 5.20 MIL/uL   Hemoglobin 9.3 (L) 12.0 - 16.0 g/dL   HCT 29.8 (L) 35.0 - 47.0 %   MCV 73.9 (L) 80.0 - 100.0 fL   MCH 23.2 (L) 26.0 - 34.0 pg   MCHC 31.4 (L) 32.0 - 36.0 g/dL   RDW 22.1 (H) 11.5 - 14.5 %   Platelets 301 150 - 440 K/uL   Neutrophils Relative % 67 %   Neutro Abs 6.5 1.4 - 6.5 K/uL   Lymphocytes Relative 21 %   Lymphs Abs 2.0 1.0 - 3.6 K/uL   Monocytes Relative 7 %   Monocytes Absolute 0.7 0.2 - 0.9 K/uL   Eosinophils Relative 2 %   Eosinophils Absolute 0.2 0 - 0.7 K/uL   Basophils Relative 3 %   Basophils Absolute 0.2 (H) 0 - 0.1 K/uL  Comprehensive metabolic panel   Collection Time: 07/29/18  5:54 AM  Result Value Ref Range   Sodium 136 135 - 145 mmol/L   Potassium 4.3 3.5 - 5.1 mmol/L   Chloride 105 98 - 111 mmol/L   CO2 23 22 - 32 mmol/L   Glucose, Bld 88 70 - 99 mg/dL   BUN 13 6 - 20 mg/dL   Creatinine, Ser 0.54 0.44 - 1.00 mg/dL   Calcium 9.1 8.9 - 10.3 mg/dL   Total Protein 6.8 6.5 - 8.1 g/dL   Albumin 2.5 (L) 3.5 - 5.0 g/dL   AST 18 15 - 41 U/L   ALT 14 0 - 44 U/L   Alkaline Phosphatase 184 (H) 38 - 126 U/L   Total Bilirubin 0.5 0.3 - 1.2 mg/dL   GFR calc non Af Amer >60 >60 mL/min   GFR calc Af Amer >60 >60 mL/min   Anion gap 8 5 - 15  Protein / creatinine ratio, urine   Collection Time: 07/29/18  2:37 PM  Result Value Ref Range   Creatinine, Urine 103 mg/dL   Total Protein, Urine 110 mg/dL   Protein Creatinine Ratio 1.07 (H) 0.00 - 0.15 mg/mg[Cre]     Constitutional: NAD, AAOx3  HE/ENT: extraocular movements grossly intact, moist mucous membranes CV: RRR PULM: nl respiratory effort, CTABL     Abd: gravid, non-tender, non-distended, soft     Ext: Non-tender, trace bilat LE   Psych: mood appropriate, speech normal Pelvic: deferred  Fetal  monitoring: Cat I Appropriate for GA- reassuring but not reactive. BPP done: 8/8  NST Baseline:  125bpm Variability: minimal to moderate Accelerations: present 10*10 only.  Decelerations: variable decel x 1. Variable decel noted at 1353: spontaneous resolution of decel when pt return to bed after OOB to bathroom on monitoring prior to BPP.   NST repeat after BPP, no further Decels, mod variability, 10*10 accels.      A/P: 37 y.o. [redacted]w[redacted]d here for antenatal surveillance for CHTN with SIPE  Pre-eclampsia, no sx of worsening dz, labs stable.   Fetal Wellbeing: NST with variable decel x 1 and min-mod variability, BPP 8/8 and repeat NST after return from [place]- reaassuring   NSTs scheduled daily with labs q3days per MFM recs.     Mc[place]ey, [place], CNM 07/30/2018  3:44 PM

## 2018-07-31 ENCOUNTER — Telehealth: Payer: Self-pay | Admitting: *Deleted

## 2018-07-31 ENCOUNTER — Inpatient Hospital Stay
Admission: EM | Admit: 2018-07-31 | Discharge: 2018-07-31 | Disposition: A | Discharge status: Home / Self Care | Payer: Medicaid Other | Attending: Obstetrics and Gynecology | Admitting: Obstetrics and Gynecology

## 2018-07-31 DIAGNOSIS — Z79899 Other long term (current) drug therapy: Secondary | ICD-10-CM | POA: Insufficient documentation

## 2018-07-31 DIAGNOSIS — Z8249 Family history of ischemic heart disease and other diseases of the circulatory system: Secondary | ICD-10-CM | POA: Insufficient documentation

## 2018-07-31 DIAGNOSIS — Z7982 Long term (current) use of aspirin: Secondary | ICD-10-CM | POA: Diagnosis not present

## 2018-07-31 DIAGNOSIS — Z3A32 32 weeks gestation of pregnancy: Secondary | ICD-10-CM | POA: Diagnosis not present

## 2018-07-31 DIAGNOSIS — Z885 Allergy status to narcotic agent status: Secondary | ICD-10-CM | POA: Insufficient documentation

## 2018-07-31 DIAGNOSIS — I10 Essential (primary) hypertension: Secondary | ICD-10-CM | POA: Insufficient documentation

## 2018-07-31 DIAGNOSIS — Z880 Allergy status to penicillin: Secondary | ICD-10-CM | POA: Diagnosis not present

## 2018-07-31 DIAGNOSIS — O09893 Supervision of other high risk pregnancies, third trimester: Secondary | ICD-10-CM | POA: Insufficient documentation

## 2018-07-31 NOTE — Discharge Summary (Signed)
Cindy Hays is a 37 y.o. female. She is at [redacted]w[redacted]d gestation. Patient's last menstrual period was 11/30/2017. Estimated Date of Delivery: 09/23/18   Prenatal care site: [facility]   Chief Complaint: high risk pregnancy, need for antepartum surveillance due to [facility] with superimposed pre-e.   S: Resting comfortably. no CTX, no VB.no LOF,  Active fetal movement. Denies HA, VD or RUQ pain. Feels well overall today.    Maternal Medical History:   Past Medical History:  Diagnosis Date  . Anemia   . Hypertension     Past Surgical History:  Procedure Laterality Date  . NO PAST SURGERIES      Allergies  Allergen Reactions  . Amoxicillin Rash  . Vicodin [Hydrocodone-Acetaminophen] Rash    Prior to Admission medications   Medication Sig Start Date End Date Taking? Authorizing Provider  albuterol (PROVENTIL HFA;VENTOLIN HFA) 108 (90 Base) MCG/ACT inhaler Inhale 2 puffs into the lungs every 4 (four) hours as needed for wheezing. Patient not taking: Reported on 07/30/2018 12/30/17   Marylene Land, NP  aspirin 81 MG chewable tablet Chew by mouth daily.    [provider]  ferrous sulfate 220 (44 Fe) MG/5ML solution Take 220 mg by mouth daily.    [provider]  labetalol (NORMODYNE) 200 MG tablet Take 2 tablets (400 mg total) by mouth 2 (two) times daily. 07/29/18   Benjaman Kindler, MD  NIFEdipine (PROCARDIA XL/ADALAT-CC) 90 MG 24 hr tablet Take 1 tablet (90 mg total) by mouth daily. 07/24/18   Benjaman Kindler, MD  Prenatal Vit-Fe Fumarate-FA (PRENATAL MULTIVITAMIN) TABS tablet Take 1 tablet by mouth daily at 12 noon.    [provider]  valACYclovir (VALTREX) 500 MG tablet Take 1 tablet (500 mg total) by mouth 2 (two) times daily. 07/30/18 08/29/18  Anaira Seay, Murray Hodgkins, CNM     Social History: She  reports that she has never smoked. She has never used smokeless tobacco. She reports that she does not drink alcohol or use drugs.  Family History:  family history includes Hypertension in her mother.   Review of Systems: A full review of systems was performed and negative except as noted in the HPI.     O:  BP 130/79 (BP Location: Left Arm)   Pulse 100   Temp 98.6 F (37 C) (Oral)   Resp 16   LMP 11/30/2017 Comment: irregular periods Results for orders placed or performed during the hospital encounter of 07/30/18 (from the past 48 hour(s))  Protein / creatinine ratio, urine   Collection Time: 07/30/18 12:24 PM  Result Value Ref Range   Creatinine, Urine 128 mg/dL   Total Protein, Urine 117 mg/dL   Protein Creatinine Ratio 0.91 (H) 0.00 - 0.15 mg/mg[Cre]  CBC   Collection Time: 07/30/18 12:27 PM  Result Value Ref Range   WBC 8.5 3.6 - 11.0 K/uL   RBC 3.66 (L) 3.80 - 5.20 MIL/uL   Hemoglobin 8.9 (L) 12.0 - 16.0 g/dL   HCT 27.4 (L) 35.0 - 47.0 %   MCV 74.9 (L) 80.0 - 100.0 fL   MCH 24.3 (L) 26.0 - 34.0 pg   MCHC 32.5 32.0 - 36.0 g/dL   RDW 22.0 (H) 11.5 - 14.5 %   Platelets 281 150 - 440 K/uL  Comprehensive metabolic panel   Collection Time: 07/30/18 12:27 PM  Result Value Ref Range   Sodium 135 135 - 145 mmol/L   Potassium 4.3 3.5 - 5.1 mmol/L   Chloride 106 98 - 111  mmol/L   CO2 23 22 - 32 mmol/L   Glucose, Bld 81 70 - 99 mg/dL   BUN 15 6 - 20 mg/dL   Creatinine, Ser 0.78 0.44 - 1.00 mg/dL   Calcium 8.7 (L) 8.9 - 10.3 mg/dL   Total Protein 6.7 6.5 - 8.1 g/dL   Albumin 2.5 (L) 3.5 - 5.0 g/dL   AST 23 15 - 41 U/L   ALT 17 0 - 44 U/L   Alkaline Phosphatase 174 (H) 38 - 126 U/L   Total Bilirubin 0.5 0.3 - 1.2 mg/dL   GFR calc non Af Amer >60 >60 mL/min   GFR calc Af Amer >60 >60 mL/min   Anion gap 6 5 - 15     Constitutional: NAD, AAOx3  HE/ENT: extraocular movements grossly intact, moist mucous membranes CV: RRR PULM: nl respiratory effort, CTABL     Abd: gravid, non-tender, non-distended, soft    Ext: Non-tender, Nonedmeatous   Psych: mood appropriate, speech normal Pelvic: deferred  Baseline:  125bpm Variability: moderate Accelerations present x >2 Decelerations absent Time 60mins  Toco: no UCs.     A/P: 37 y.o. [redacted]w[redacted]d with high risk pregnancy and antepartum surveillance for CHTn with SIPE    Fetal Wellbeing: Reassuring Cat 1 tracing with reactive NST.  Labs due Monday 08/02/18  D/c home stable, precautions reviewed, follow-up with daily NST and serial BPs tomorrow.   ----- Maylie Ashton, [place] A, CNM 07/31/2018  3:31 PM

## 2018-07-31 NOTE — OB Triage Note (Signed)
Pt came in for scheduled daily NST. No further complains.  Augustine Radar, RN

## 2018-08-01 ENCOUNTER — Inpatient Hospital Stay
Admission: EM | Admit: 2018-08-01 | Discharge: 2018-08-01 | Disposition: A | Discharge status: Home / Self Care | Payer: Medicaid Other | Attending: Obstetrics and Gynecology | Admitting: Obstetrics and Gynecology

## 2018-08-01 DIAGNOSIS — Z880 Allergy status to penicillin: Secondary | ICD-10-CM | POA: Insufficient documentation

## 2018-08-01 DIAGNOSIS — O09893 Supervision of other high risk pregnancies, third trimester: Secondary | ICD-10-CM | POA: Insufficient documentation

## 2018-08-01 DIAGNOSIS — Z3A32 32 weeks gestation of pregnancy: Secondary | ICD-10-CM | POA: Diagnosis not present

## 2018-08-01 DIAGNOSIS — Z79899 Other long term (current) drug therapy: Secondary | ICD-10-CM | POA: Insufficient documentation

## 2018-08-01 DIAGNOSIS — Z7982 Long term (current) use of aspirin: Secondary | ICD-10-CM | POA: Diagnosis not present

## 2018-08-01 DIAGNOSIS — Z8249 Family history of ischemic heart disease and other diseases of the circulatory system: Secondary | ICD-10-CM | POA: Diagnosis not present

## 2018-08-01 DIAGNOSIS — I10 Essential (primary) hypertension: Secondary | ICD-10-CM | POA: Insufficient documentation

## 2018-08-01 DIAGNOSIS — Z885 Allergy status to narcotic agent status: Secondary | ICD-10-CM | POA: Insufficient documentation

## 2018-08-01 NOTE — OB Triage Note (Signed)
Patient here for daily NST no complaints

## 2018-08-01 NOTE — Final Progress Note (Signed)
Cindy Hays is a 37 y.o. female. She is at [redacted]w[redacted]d gestation. Patient's last menstrual period was 11/30/2017. Estimated Date of Delivery: 09/23/18   Prenatal care site: [facility]   Chief Complaint: high risk pregnancy, need for antepartum surveillance due to [facility] with superimposed pre-e.  S: Resting comfortably. no CTX, no VB.no LOF,  Active fetal movement. Feels great today, did not take AM labetalol due to BP 111/60. Denies HA, VD or RUQ pain.   Maternal Medical History:   Past Medical History:  Diagnosis Date  . Anemia   . Hypertension     Past Surgical History:  Procedure Laterality Date  . NO PAST SURGERIES      Allergies  Allergen Reactions  . Amoxicillin Rash  . Vicodin [Hydrocodone-Acetaminophen] Rash    Prior to Admission medications   Medication Sig Start Date End Date Taking? Authorizing Provider  albuterol (PROVENTIL HFA;VENTOLIN HFA) 108 (90 Base) MCG/ACT inhaler Inhale 2 puffs into the lungs every 4 (four) hours as needed for wheezing. Patient not taking: Reported on 07/30/2018 12/30/17   Marylene Land, NP  aspirin 81 MG chewable tablet Chew by mouth daily.    [provider]  ferrous sulfate 220 (44 Fe) MG/5ML solution Take 220 mg by mouth daily.    [provider]  labetalol (NORMODYNE) 200 MG tablet Take 2 tablets (400 mg total) by mouth 2 (two) times daily. 07/29/18   Benjaman Kindler, MD  NIFEdipine (PROCARDIA XL/ADALAT-CC) 90 MG 24 hr tablet Take 1 tablet (90 mg total) by mouth daily. 07/24/18   Benjaman Kindler, MD  Prenatal Vit-Fe Fumarate-FA (PRENATAL MULTIVITAMIN) TABS tablet Take 1 tablet by mouth daily at 12 noon.    [provider]  valACYclovir (VALTREX) 500 MG tablet Take 1 tablet (500 mg total) by mouth 2 (two) times daily. 07/30/18 08/29/18  Breawna Montenegro, Murray Hodgkins, CNM     Social History: She  reports that she has never smoked. She has never used smokeless tobacco. She reports that she does not drink alcohol or  use drugs.  Family History: family history includes Hypertension in her mother.   Review of Systems: A full review of systems was performed and negative except as noted in the HPI.     O:  BP 124/80   Pulse 88   LMP 11/30/2017 Comment: irregular periods No results found for this or any previous visit (from the past 48 hour(s)).   Constitutional: NAD, AAOx3  HE/ENT: extraocular movements grossly intact, moist mucous membranes CV: RRR PULM: nl respiratory effort, CTABL     Abd: gravid, non-tender, non-distended, soft   Ext: Non-tender, Nonedmeatous   Psych: mood appropriate, speech normal Pelvic: deferred  Baseline: 125bpm Variability: moderate Accelerations: present x >2, 15*15 Decelerations absent Time 58mins    A/P: 37 y.o. [redacted]w[redacted]d with high risk pregnancy and antepartum surveillance for CHTN with SIPE    Fetal Wellbeing: Reassuring Cat 1 tracing with reactive NST.  Labs due Monday 08/02/18  D/c home stable, precautions reviewed, follow-up with daily NST and serial BPs tomorrow.   ----- Taquan Bralley A, CNM 08/01/2018  4:14 PM

## 2018-08-02 ENCOUNTER — Other Ambulatory Visit: Payer: Self-pay

## 2018-08-02 ENCOUNTER — Observation Stay
Admission: EM | Admit: 2018-08-02 | Discharge: 2018-08-02 | Disposition: A | Discharge status: Home / Self Care | Payer: Medicaid Other | Attending: Obstetrics & Gynecology | Admitting: Obstetrics & Gynecology

## 2018-08-02 DIAGNOSIS — Z79899 Other long term (current) drug therapy: Secondary | ICD-10-CM | POA: Insufficient documentation

## 2018-08-02 DIAGNOSIS — Z8249 Family history of ischemic heart disease and other diseases of the circulatory system: Secondary | ICD-10-CM | POA: Insufficient documentation

## 2018-08-02 DIAGNOSIS — Z880 Allergy status to penicillin: Secondary | ICD-10-CM | POA: Diagnosis not present

## 2018-08-02 DIAGNOSIS — I10 Essential (primary) hypertension: Secondary | ICD-10-CM | POA: Insufficient documentation

## 2018-08-02 DIAGNOSIS — O26893 Other specified pregnancy related conditions, third trimester: Secondary | ICD-10-CM | POA: Diagnosis not present

## 2018-08-02 DIAGNOSIS — Z833 Family history of diabetes mellitus: Secondary | ICD-10-CM | POA: Diagnosis not present

## 2018-08-02 DIAGNOSIS — Z3A32 32 weeks gestation of pregnancy: Secondary | ICD-10-CM | POA: Diagnosis not present

## 2018-08-02 DIAGNOSIS — D649 Anemia, unspecified: Secondary | ICD-10-CM | POA: Insufficient documentation

## 2018-08-02 DIAGNOSIS — Z820 Family history of epilepsy and other diseases of the nervous system: Secondary | ICD-10-CM | POA: Insufficient documentation

## 2018-08-02 DIAGNOSIS — Z7982 Long term (current) use of aspirin: Secondary | ICD-10-CM | POA: Insufficient documentation

## 2018-08-02 LAB — CBC
HCT: 28.1 % — ABNORMAL LOW (ref 35.0–47.0)
Hemoglobin: 9.1 g/dL — ABNORMAL LOW (ref 12.0–16.0)
MCH: 24.9 pg — AB (ref 26.0–34.0)
MCHC: 32.4 g/dL (ref 32.0–36.0)
MCV: 76.8 fL — ABNORMAL LOW (ref 80.0–100.0)
Platelets: 255 10*3/uL (ref 150–440)
RBC: 3.66 MIL/uL — ABNORMAL LOW (ref 3.80–5.20)
RDW: 22.5 % — ABNORMAL HIGH (ref 11.5–14.5)
WBC: 7.3 10*3/uL (ref 3.6–11.0)

## 2018-08-02 LAB — COMPREHENSIVE METABOLIC PANEL
ALK PHOS: 196 U/L — AB (ref 38–126)
ALT: 14 U/L (ref 0–44)
AST: 23 U/L (ref 15–41)
Albumin: 2.5 g/dL — ABNORMAL LOW (ref 3.5–5.0)
Anion gap: 6 (ref 5–15)
BILIRUBIN TOTAL: 0.7 mg/dL (ref 0.3–1.2)
BUN: 9 mg/dL (ref 6–20)
CALCIUM: 8.6 mg/dL — AB (ref 8.9–10.3)
CO2: 21 mmol/L — ABNORMAL LOW (ref 22–32)
Chloride: 108 mmol/L (ref 98–111)
Creatinine, Ser: 0.58 mg/dL (ref 0.44–1.00)
GFR calc non Af Amer: >60 mL/min (ref >60)
Glucose, Bld: 114 mg/dL — ABNORMAL HIGH (ref 70–99)
Potassium: 3.9 mmol/L (ref 3.5–5.1)
Sodium: 135 mmol/L (ref 135–145)
TOTAL PROTEIN: 6.7 g/dL (ref 6.5–8.1)

## 2018-08-02 LAB — PROTEIN / CREATININE RATIO, URINE
Creatinine, Urine: 75 mg/dL
Protein Creatinine Ratio: 1.35 mg/mg{Cre} — ABNORMAL HIGH (ref 0.00–0.15)
Total Protein, Urine: 101 mg/dL

## 2018-08-02 NOTE — OB Triage Note (Signed)
Pt. to triage for NST for pre-eclampsia monitoring. She reports no pain or leakage of fluid at this time. Pelvic pressure noted that began 9/15 in the evening. She reports it as mild and intermittent.Vitals stable. Will continue to monitor.

## 2018-08-03 ENCOUNTER — Observation Stay
Admission: EM | Admit: 2018-08-03 | Discharge: 2018-08-03 | Disposition: A | Discharge status: Home / Self Care | Payer: Medicaid Other | Attending: Obstetrics and Gynecology | Admitting: Obstetrics and Gynecology

## 2018-08-03 DIAGNOSIS — Z3A32 32 weeks gestation of pregnancy: Secondary | ICD-10-CM | POA: Diagnosis not present

## 2018-08-03 DIAGNOSIS — O1413 Severe pre-eclampsia, third trimester: Principal | ICD-10-CM | POA: Insufficient documentation

## 2018-08-03 NOTE — Discharge Summary (Signed)
Pt at 32+ 5weeks with preeclampsia with severe features being follow as an outpt because the pt left AMA to care for her other children .  Pt is returning daily to do NST and repeat labs q 3 days . No  H/a or vision changes . VS BP normotensive  reactive NST  A/P cont daily eval at the hospital .  Goal is to get her to 34 weeks then induce labor . Before if clinically she worsens

## 2018-08-04 ENCOUNTER — Inpatient Hospital Stay: Payer: Medicaid Other

## 2018-08-04 ENCOUNTER — Observation Stay
Admission: EM | Admit: 2018-08-04 | Discharge: 2018-08-04 | Disposition: A | Discharge status: Home / Self Care | Payer: Medicaid Other | Attending: Obstetrics and Gynecology | Admitting: Obstetrics and Gynecology

## 2018-08-04 DIAGNOSIS — O09523 Supervision of elderly multigravida, third trimester: Secondary | ICD-10-CM | POA: Diagnosis not present

## 2018-08-04 DIAGNOSIS — O288 Other abnormal findings on antenatal screening of mother: Secondary | ICD-10-CM

## 2018-08-04 DIAGNOSIS — Z3A32 32 weeks gestation of pregnancy: Secondary | ICD-10-CM | POA: Insufficient documentation

## 2018-08-04 DIAGNOSIS — Z3490 Encounter for supervision of normal pregnancy, unspecified, unspecified trimester: Secondary | ICD-10-CM

## 2018-08-04 NOTE — OB Triage Note (Addendum)
Pt is a G6P4 at [redacted]w[redacted]d that presents for her scheduled daily NST. Pt denies VB, LOF and states positive FM. Pt denies Headache or blurred vision today and her initial BP was 136/82. Initial FHT 130 with monitors applied and assessing.

## 2018-08-04 NOTE — Progress Notes (Signed)
Pt was taken down to [place] for a BPP.

## 2018-08-04 NOTE — Progress Notes (Signed)
TRIAGE VISIT with NST   [name] is a 37 y.o. [id number]. She is at [redacted]w[redacted]d gestation.  Indication: Elevated Blood Pressure with known dx of cHTN and superimposed severe range BP  S: Resting comfortably. no CTX, no VB. Active fetal movement. Denies headache, SOB, new onset swelling, RUQ pain, visual changes. No changes in amount of urine or frequency.  BP here is below mild range She is on nifedipine XL 90mg  daily and 400mg  labetalol BID O:  BP 132/80   Pulse 87   Temp 98.1 F (36.7 C) (Oral)   LMP 11/30/2017 Comment: irregular periods No results found for this or any previous visit (from the past 48 hour(s)).   Gen: NAD, AAOx3      Abd: FNTTP      Ext: Non-tender, Nonedmeatous   FHT: 135, min to mod var, decel on first placing on monitor --BPP ordered TOCO: quiet SVE:  deferred  NST: Category I strip, see detailed evaluation above  A/P:  37 y.o. [id number] [redacted]w[redacted]d with superimposed severe range PreE on cHTN, who is being managed as an outpatient due to social concerns with her family, for which she needed to sign out AMA. We are supporting her the best we can and she is also doing the best she can. She has received steroids and mag, and is scheduled for a 34wk induction, but low threshold if clinical status alters. We are getting PreE labs q3 days and monitoring proteinuria.                       Labor: not present.   PreEclampsia: No evidence of increase in disease today. Will repeat labs q3 days  Fetal Wellbeing: Non reactive but appropriate for gestational age. BPP 8/8  D/c home stable, precautions reviewed, follow-up as scheduled.    Patient ID: Cindy Hays, female   DOB: September 17, 1981, 37 y.o.   MRN: [id number]

## 2018-08-05 ENCOUNTER — Observation Stay
Admission: EM | Admit: 2018-08-05 | Discharge: 2018-08-05 | Disposition: A | Discharge status: Home / Self Care | Payer: Medicaid Other | Attending: Certified Nurse Midwife | Admitting: Certified Nurse Midwife

## 2018-08-05 DIAGNOSIS — O1413 Severe pre-eclampsia, third trimester: Secondary | ICD-10-CM | POA: Insufficient documentation

## 2018-08-05 DIAGNOSIS — O09523 Supervision of elderly multigravida, third trimester: Secondary | ICD-10-CM | POA: Diagnosis present

## 2018-08-05 DIAGNOSIS — O163 Unspecified maternal hypertension, third trimester: Principal | ICD-10-CM | POA: Insufficient documentation

## 2018-08-05 DIAGNOSIS — O119 Pre-existing hypertension with pre-eclampsia, unspecified trimester: Secondary | ICD-10-CM | POA: Diagnosis present

## 2018-08-05 DIAGNOSIS — Z3A34 34 weeks gestation of pregnancy: Secondary | ICD-10-CM | POA: Insufficient documentation

## 2018-08-05 LAB — CBC
HCT: 28.2 % — ABNORMAL LOW (ref 35.0–47.0)
Hemoglobin: 8.9 g/dL — ABNORMAL LOW (ref 12.0–16.0)
MCH: 24.1 pg — ABNORMAL LOW (ref 26.0–34.0)
MCHC: 31.7 g/dL — AB (ref 32.0–36.0)
MCV: 76.1 fL — ABNORMAL LOW (ref 80.0–100.0)
PLATELETS: 258 10*3/uL (ref 150–440)
RBC: 3.71 MIL/uL — ABNORMAL LOW (ref 3.80–5.20)
RDW: 23.8 % — AB (ref 11.5–14.5)
WBC: 7.5 10*3/uL (ref 3.6–11.0)

## 2018-08-05 LAB — PROTEIN / CREATININE RATIO, URINE
CREATININE, URINE: 183 mg/dL
Protein Creatinine Ratio: 0.49 mg/mg{Cre} — ABNORMAL HIGH (ref 0.00–0.15)
TOTAL PROTEIN, URINE: 90 mg/dL

## 2018-08-05 LAB — COMPREHENSIVE METABOLIC PANEL
ALBUMIN: 2.5 g/dL — AB (ref 3.5–5.0)
ALK PHOS: 198 U/L — AB (ref 38–126)
ALT: 12 U/L (ref 0–44)
ANION GAP: 6 (ref 5–15)
AST: 21 U/L (ref 15–41)
BILIRUBIN TOTAL: 0.5 mg/dL (ref 0.3–1.2)
BUN: 8 mg/dL (ref 6–20)
CALCIUM: 8.6 mg/dL — AB (ref 8.9–10.3)
CO2: 21 mmol/L — ABNORMAL LOW (ref 22–32)
CREATININE: 0.58 mg/dL (ref 0.44–1.00)
Chloride: 109 mmol/L (ref 98–111)
GFR calc Af Amer: >60 mL/min (ref >60)
GFR calc non Af Amer: >60 mL/min (ref >60)
GLUCOSE: 78 mg/dL (ref 70–99)
Potassium: 4.2 mmol/L (ref 3.5–5.1)
Sodium: 136 mmol/L (ref 135–145)
Total Protein: 6.7 g/dL (ref 6.5–8.1)

## 2018-08-05 MED ORDER — ACETAMINOPHEN 325 MG PO TABS
650.0000 mg | ORAL_TABLET | Freq: Once | ORAL | Status: AC
  Administered 2018-08-05: 650 mg via ORAL
  Filled 2018-08-05: qty 2

## 2018-08-05 MED ORDER — LABETALOL HCL 5 MG/ML IV SOLN: 20.0000 mg | INTRAVENOUS | Status: DC | PRN

## 2018-08-05 MED ORDER — HYDRALAZINE HCL 20 MG/ML IJ SOLN: 10.0000 mg | INTRAMUSCULAR | Status: DC | PRN

## 2018-08-05 MED ORDER — LABETALOL HCL 5 MG/ML IV SOLN: 40.0000 mg | INTRAVENOUS | Status: DC | PRN

## 2018-08-05 MED ORDER — LABETALOL HCL 5 MG/ML IV SOLN: 80.0000 mg | INTRAVENOUS | Status: DC | PRN

## 2018-08-05 NOTE — Discharge Instructions (Signed)
Please call [phone] [clinic M-F 8:30a-4:30p] or [phone] [L&D] if you have any of the following symptoms: -Swelling of the face or hands -Headache that will not go away -Blurry vision, white or black spots in your vision, bright lights/stars in your vision -Pain on the right, upper side of your abdomen that aches and is not from the baby's feet kicking you -Pain in the upper, middle of your abdomen, directly under your ribcage -Nausea or vomiting/unable to keep food or liquids down -Difficulty breathing  

## 2018-08-05 NOTE — Discharge Summary (Signed)
Triage visit for NST   Cindy Hays is a 37 y.o. [id number]. She is at [redacted]w[redacted]d gestation. She presents for a scheduled NST.  Indication: chronic hypertension with superimposed preeclampsia with severe features.   S: Resting comfortably.   She repots:  -No contractions/cramping -No vaginal bleeding  -No leakage of fluid -Active fetal movement -No headaches or vision changes -No epigastric or RUQ pain -No nausea or vomiting -No new onset swelling  O:  BP 139/83   Pulse 81   LMP 11/30/2017 Comment: irregular periods Results for orders placed or performed during the hospital encounter of 08/05/18 (from the past 48 hour(s))  Protein / creatinine ratio, urine   Collection Time: 08/05/18 11:56 AM  Result Value Ref Range   Creatinine, Urine 183 mg/dL   Total Protein, Urine 90 mg/dL   Protein Creatinine Ratio 0.49 (H) 0.00 - 0.15 mg/mg[Cre]  Comprehensive metabolic panel   Collection Time: 08/05/18 12:29 PM  Result Value Ref Range   Sodium 136 135 - 145 mmol/L   Potassium 4.2 3.5 - 5.1 mmol/L   Chloride 109 98 - 111 mmol/L   CO2 21 (L) 22 - 32 mmol/L   Glucose, Bld 78 70 - 99 mg/dL   BUN 8 6 - 20 mg/dL   Creatinine, Ser 0.58 0.44 - 1.00 mg/dL   Calcium 8.6 (L) 8.9 - 10.3 mg/dL   Total Protein 6.7 6.5 - 8.1 g/dL   Albumin 2.5 (L) 3.5 - 5.0 g/dL   AST 21 15 - 41 U/L   ALT 12 0 - 44 U/L   Alkaline Phosphatase 198 (H) 38 - 126 U/L   Total Bilirubin 0.5 0.3 - 1.2 mg/dL   GFR calc non Af Amer >60 >60 mL/min   GFR calc Af Amer >60 >60 mL/min   Anion gap 6 5 - 15  CBC   Collection Time: 08/05/18 12:29 PM  Result Value Ref Range   WBC 7.5 3.6 - 11.0 K/uL   RBC 3.71 (L) 3.80 - 5.20 MIL/uL   Hemoglobin 8.9 (L) 12.0 - 16.0 g/dL   HCT 28.2 (L) 35.0 - 47.0 %   MCV 76.1 (L) 80.0 - 100.0 fL   MCH 24.1 (L) 26.0 - 34.0 pg   MCHC 31.7 (L) 32.0 - 36.0 g/dL   RDW 23.8 (H) 11.5 - 14.5 %   Platelets 258 150 - 440 K/uL     Constitutional: NAD, AAOx3  HE/ENT: extraocular movements grossly  intact, moist mucous membranes CV: RRR PULM: normal respiratory effort, CTABL     Abd: gravid, non-tender, non-distended, soft      Ext: Non-tender, Nonedmeatous   Psych: mood appropriate, speech normal Pelvic deferred  NST/fetal monitoring Baseline: 115 Variability: moderate Accelerations: 15x15s present regularly Decelerations: variable decel with contraction Time: 5 hours  Interpretation: reactive and reassuring FHT  A/P:  37 y.o. [id number] [redacted]w[redacted]d with chronic hypertension with superimposed preeclampsia with severe features.    Fetal Wellbeing:   Reassuring  Reactive NST, with moderate variability and accelerations, variable decel with one contraction, followed by multiple hours of reassuring monitoring   S/p betamethasone for fetal lung maturity   Chronic hypertension with superimposed preeclampsia with severe features  CBC, CMP, and P/C ratio stable, without evidence of advancing disease  BP normal to mild rnage  Continue on nifedipine XL 90mg  daily and 400mg  labetalol BID  D/c home stable, precautions reviewed, follow-up as scheduled.   Continue daily NSTs with labs every 3 days  Goal is to get to [redacted]w[redacted]d and  then induce labor  ----- Lisette Grinder, CNM, WHNP-BC [facility], Department of [facility]

## 2018-08-05 NOTE — Discharge Summary (Signed)
Subjective   Cindy Hays is a 37 y.o. [id number]. She is at [redacted]w[redacted]d gestation.  Indication: Elevated Blood Pressure with known dx of cHTN and superimposed severe range BP  S: Resting comfortably. no CTX, no VB. Active fetal movement. Denies headache, SOB, new onset swelling, RUQ pain, visual changes. No changes in amount of urine or frequency.  BP here is below mild range She is on nifedipine XL 90mg  daily and 400mg  labetalol BID O:  Objective   BP 132/80   Pulse 87   Temp 98.1 F (36.7 C) (Oral)   LMP 11/30/2017 Comment: irregular periods No results found for this or any previous visit (from the past 48 hour(s)).  Gen: NAD, AAOx3                                           Abd: FNTTP                                                     Ext: Non-tender, Nonedmeatous          FHT: 135, min to mod var, decel on first placing on monitor --BPP ordered TOCO: quiet SVE:  deferred  NST: Category I strip, see detailed evaluation above  A/P:  37 y.o. [id number] [redacted]w[redacted]d with superimposed severe range PreE on cHTN, who is being managed as an outpatient due to social concerns with her family, for which she needed to sign out AMA. We are supporting her the best we can though inpatient care is what we recommend, and she is also doing the best she can. She has received steroids and mag, and is scheduled for a 34wk induction, but low threshold if clinical status alters. We are getting PreE labs q3 days and monitoring proteinuria.                       Labor: not present.   PreEclampsia: No evidence of increase in disease today. Will repeat labs q3 days  Fetal Wellbeing: Non reactive but appropriate for gestational age. BPP 8/8  D/c home stable, precautions reviewed, follow-up as scheduled.

## 2018-08-05 NOTE — OB Triage Note (Signed)
Daily NST Pt reports still feeling baby move. Denies any vaginal bleeding or leaking fluid.

## 2018-08-06 ENCOUNTER — Inpatient Hospital Stay: Payer: Medicaid Other

## 2018-08-06 ENCOUNTER — Inpatient Hospital Stay
Admission: EM | Admit: 2018-08-06 | Discharge: 2018-08-06 | Disposition: A | Discharge status: Home / Self Care | Payer: Medicaid Other | Source: Home / Self Care | Admitting: Obstetrics & Gynecology

## 2018-08-06 DIAGNOSIS — Z3689 Encounter for other specified antenatal screening: Secondary | ICD-10-CM

## 2018-08-06 DIAGNOSIS — O288 Other abnormal findings on antenatal screening of mother: Secondary | ICD-10-CM

## 2018-08-06 NOTE — Discharge Summary (Signed)
Pierre Cumpton is a 37 y.o. female. She is at 103w4d gestation. Patient's last menstrual period was 11/30/2017. Estimated Date of Delivery: 09/23/18   Prenatal care site: [facility]   Chief Complaint: high risk pregnancy, need for antepartum surveillance  S: Resting comfortably. no CTX, no VB.no LOF,  Active fetal movement.  Denies: HA, visual changes, SOB, or RUQ/epigastric pain     Maternal Medical History:   Past Medical History:  Diagnosis Date  . Anemia   . Hypertension     Past Surgical History:  Procedure Laterality Date  . NO PAST SURGERIES      Allergies  Allergen Reactions  . Amoxicillin Rash  . Vicodin [Hydrocodone-Acetaminophen] Rash    Prior to Admission medications   Medication Sig Start Date End Date Taking? Authorizing Provider  aspirin 81 MG chewable tablet Chew by mouth daily.   Yes [provider]  ferrous sulfate 220 (44 Fe) MG/5ML solution Take 220 mg by mouth daily.   Yes [provider]  labetalol (NORMODYNE) 200 MG tablet Take 2 tablets (400 mg total) by mouth 2 (two) times daily. 07/29/18  Yes Benjaman Kindler, MD  NIFEdipine (PROCARDIA XL/ADALAT-CC) 90 MG 24 hr tablet Take 1 tablet (90 mg total) by mouth daily. 07/24/18  Yes Benjaman Kindler, MD  Prenatal Vit-Fe Fumarate-FA (PRENATAL MULTIVITAMIN) TABS tablet Take 1 tablet by mouth daily at 12 noon.   Yes [provider]  valACYclovir (VALTREX) 500 MG tablet Take 1 tablet (500 mg total) by mouth 2 (two) times daily. 07/30/18 08/29/18 Yes McVey, Murray Hodgkins, CNM  albuterol (PROVENTIL HFA;VENTOLIN HFA) 108 (90 Base) MCG/ACT inhaler Inhale 2 puffs into the lungs every 4 (four) hours as needed for wheezing. Patient not taking: Reported on 07/30/2018 12/30/17   Marylene Land, NP     Social History: She  reports that she has never smoked. She has never used smokeless tobacco. She reports that she drank alcohol. She reports that she does not use drugs.  Family  History: family history includes Dementia in her maternal grandmother; Diabetes in her maternal grandmother; Hypertension in her maternal grandmother and mother.   Review of Systems: A full review of systems was performed and negative except as noted in the HPI.     O:  BP 138/82   Pulse 78   Temp 98.4 F (36.9 C) (Oral)   Resp 16   Ht 5\' 2"  (1.575 m)   Wt 92.5 kg   LMP 11/30/2017 Comment: irregular periods  BMI 37.31 kg/m  Results for orders placed or performed during the hospital encounter of 08/05/18 (from the past 48 hour(s))  Protein / creatinine ratio, urine   Collection Time: 08/05/18 11:56 AM  Result Value Ref Range   Creatinine, Urine 183 mg/dL   Total Protein, Urine 90 mg/dL   Protein Creatinine Ratio 0.49 (H) 0.00 - 0.15 mg/mg[Cre]  Comprehensive metabolic panel   Collection Time: 08/05/18 12:29 PM  Result Value Ref Range   Sodium 136 135 - 145 mmol/L   Potassium 4.2 3.5 - 5.1 mmol/L   Chloride 109 98 - 111 mmol/L   CO2 21 (L) 22 - 32 mmol/L   Glucose, Bld 78 70 - 99 mg/dL   BUN 8 6 - 20 mg/dL   Creatinine, Ser 0.58 0.44 - 1.00 mg/dL   Calcium 8.6 (L) 8.9 - 10.3 mg/dL   Total Protein 6.7 6.5 - 8.1 g/dL   Albumin 2.5 (L) 3.5 - 5.0 g/dL   AST 21 15 - 41  U/L   ALT 12 0 - 44 U/L   Alkaline Phosphatase 198 (H) 38 - 126 U/L   Total Bilirubin 0.5 0.3 - 1.2 mg/dL   GFR calc non Af Amer >60 >60 mL/min   GFR calc Af Amer >60 >60 mL/min   Anion gap 6 5 - 15  CBC   Collection Time: 08/05/18 12:29 PM  Result Value Ref Range   WBC 7.5 3.6 - 11.0 K/uL   RBC 3.71 (L) 3.80 - 5.20 MIL/uL   Hemoglobin 8.9 (L) 12.0 - 16.0 g/dL   HCT 28.2 (L) 35.0 - 47.0 %   MCV 76.1 (L) 80.0 - 100.0 fL   MCH 24.1 (L) 26.0 - 34.0 pg   MCHC 31.7 (L) 32.0 - 36.0 g/dL   RDW 23.8 (H) 11.5 - 14.5 %   Platelets 258 150 - 440 K/uL     Constitutional: NAD, AAOx3  HE/ENT: extraocular movements grossly intact, moist mucous membranes CV: RRR PULM: nl respiratory effort, CTABL     Abd: gravid,  non-tender, non-distended, soft      Ext: Non-tender, Nonedmeatous   Psych: mood appropriate, speech normal Pelvic: deferred  Baseline: 115 Variability: moderate Accelerations present x >2 Decelerations absent Time 45mins    A/P: 37 y.o. [redacted]w[redacted]d with high risk pregnancy and antepartum surveillance.   Labor: not present.   Fetal Wellbeing: Reassuring Cat 1 tracing.  Reactive NST   Severe preeclampsia superimposed on chtn: continue antihypertensives, patient chooses to return home rather than be admitted.  She will return daily for NSTs and q3day labs.  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   ----- Larey Days, MD Attending Obstetrician and Gynecologist [facility], Department of [facility]

## 2018-08-06 NOTE — Progress Notes (Signed)
Discharge instructions gone over with patient who verbalized understanding and will return tomorrow morning for NST and [place]. She understands when to return to hospital

## 2018-08-06 NOTE — Discharge Summary (Signed)
Obstetric Discharge Summary Reason for Admission: NST due to pre-ecclampsia needed daily   Cindy Hays is a 37 y.o. [id number]. She is at [redacted]w[redacted]d gestation. She presents for a scheduled NST.  Indication: chronic hypertension with superimposed preeclampsia with severe features. Severe Fe def  Prenatal Procedures: NST Non-reactive today, mod variability, 1 variable decel, no 10 x 10 accels noted Intrapartum Procedures: N/A Postpartum Procedures: N/A Complications-Operative and Postpartum:  Th PTL with hx of BMZ x 2, Pt on BP meds:Nifedipine and Labetalol  Hemoglobin  Date Value Ref Range Status  08/05/2018 8.9 (L) 12.0 - 16.0 g/dL Final   HGB  Date Value Ref Range Status  07/03/2014 11.5 (L) 12.0 - 16.0 g/dL Final   HCT  Date Value Ref Range Status  08/05/2018 28.2 (L) 35.0 - 47.0 % Final  07/03/2014 34.7 (L) 35.0 - 47.0 % Final    Physical Exam:  Gen:A,A&Ox3 HEENT: Normocephalic, Eyes non-icteric. HEART:S1S2, RRR, No M/R/G LUNGS:CTA bilat, no W/R/R Extrems:warm, dry, NT, Neg Homan's Uterine Fundus: Gravid Results for COUSINBellarose, Burtt (MRN [id number]) as of 08/06/2018 12:45  Ref. Range 07/30/2018 14:25 08/02/2018 09:12 08/04/2018 14:29 08/05/2018 11:56 08/05/2018 12:29  COMPREHENSIVE METABOLIC PANEL Unknown  Rpt (A)   Rpt (A)  Sodium Latest Ref Range: 135 - 145 mmol/L  135   136  Potassium Latest Ref Range: 3.5 - 5.1 mmol/L  3.9   4.2  Chloride Latest Ref Range: 98 - 111 mmol/L  108   109  CO2 Latest Ref Range: 22 - 32 mmol/L  21 (L)   21 (L)  Glucose Latest Ref Range: 70 - 99 mg/dL  114 (H)   78  BUN Latest Ref Range: 6 - 20 mg/dL  9   8  Creatinine Latest Ref Range: 0.44 - 1.00 mg/dL  0.58   0.58  Calcium Latest Ref Range: 8.9 - 10.3 mg/dL  8.6 (L)   8.6 (L)  Anion gap Latest Ref Range: 5 - 15   6   6   Alkaline Phosphatase Latest Ref Range: 38 - 126 U/L  196 (H)   198 (H)  Albumin Latest Ref Range: 3.5 - 5.0 g/dL  2.5 (L)   2.5 (L)  AST Latest Ref Range: 15 - 41 U/L  23   21   ALT Latest Ref Range: 0 - 44 U/L  14   12  Total Protein Latest Ref Range: 6.5 - 8.1 g/dL  6.7   6.7  Total Bilirubin Latest Ref Range: 0.3 - 1.2 mg/dL  0.7   0.5  GFR, Est Non African American Latest Ref Range: >60 mL/min  >60   >60  GFR, Est African American Latest Ref Range: >60 mL/min  >60   >60  WBC Latest Ref Range: 3.6 - 11.0 K/uL  7.3   7.5  RBC Latest Ref Range: 3.80 - 5.20 MIL/uL  3.66 (L)   3.71 (L)  Hemoglobin Latest Ref Range: 12.0 - 16.0 g/dL  9.1 (L)   8.9 (L)  HCT Latest Ref Range: 35.0 - 47.0 %  28.1 (L)   28.2 (L)  MCV Latest Ref Range: 80.0 - 100.0 fL  76.8 (L)   76.1 (L)  MCH Latest Ref Range: 26.0 - 34.0 pg  24.9 (L)   24.1 (L)  MCHC Latest Ref Range: 32.0 - 36.0 g/dL  32.4   31.7 (L)  RDW Latest Ref Range: 11.5 - 14.5 %  22.5 (H)   23.8 (H)  Platelets Latest Ref Range: 150 - 440 K/uL  255   258   Discharge Diagnoses: IUP at 33 1/7 weeks with Pre-ecclampsia, BPP 8/10, NST now reactive, Disc with Dr Leonides Schanz and agrees to repeat NST daily with AFI on Monday. Growth [place] reviewed from 07/22/18 with EFW at 29%/1593 gms  Discharge Information: Date: 08/06/2018 Activity: Up ad lib Diet: Regular  Medications: Nifedipine 90 XL qd and Labetalol 400 mg BID Condition: Stable  Instructions: FKC's daily  Discharge to: Home   ____________________________ Danford Bad, MSN, CNM, FNP Certified Nurse Midwife [facility] [place] 08/06/2018, 12:41 PM

## 2018-08-07 ENCOUNTER — Encounter: Payer: Self-pay | Admitting: Obstetrics and Gynecology

## 2018-08-07 ENCOUNTER — Other Ambulatory Visit: Payer: Self-pay

## 2018-08-07 ENCOUNTER — Inpatient Hospital Stay
Admission: EM | Admit: 2018-08-07 | Discharge: 2018-08-07 | Disposition: A | Discharge status: Home / Self Care | Payer: Medicaid Other | Source: Home / Self Care | Admitting: Obstetrics and Gynecology

## 2018-08-07 ENCOUNTER — Encounter: Payer: Self-pay | Admitting: *Deleted

## 2018-08-07 ENCOUNTER — Inpatient Hospital Stay
Admission: EM | Admit: 2018-08-07 | Discharge: 2018-08-09 | DRG: 806 | Disposition: A | Discharge status: Home / Self Care | Payer: Medicaid Other | Attending: Obstetrics and Gynecology | Admitting: Obstetrics and Gynecology

## 2018-08-07 ENCOUNTER — Inpatient Hospital Stay: Payer: Medicaid Other

## 2018-08-07 DIAGNOSIS — Z3A33 33 weeks gestation of pregnancy: Secondary | ICD-10-CM

## 2018-08-07 DIAGNOSIS — D649 Anemia, unspecified: Secondary | ICD-10-CM | POA: Diagnosis present

## 2018-08-07 DIAGNOSIS — O99214 Obesity complicating childbirth: Secondary | ICD-10-CM | POA: Diagnosis present

## 2018-08-07 DIAGNOSIS — E669 Obesity, unspecified: Secondary | ICD-10-CM | POA: Diagnosis present

## 2018-08-07 DIAGNOSIS — O119 Pre-existing hypertension with pre-eclampsia, unspecified trimester: Secondary | ICD-10-CM | POA: Diagnosis present

## 2018-08-07 DIAGNOSIS — O114 Pre-existing hypertension with pre-eclampsia, complicating childbirth: Secondary | ICD-10-CM | POA: Diagnosis present

## 2018-08-07 DIAGNOSIS — O368130 Decreased fetal movements, third trimester, not applicable or unspecified: Secondary | ICD-10-CM | POA: Diagnosis present

## 2018-08-07 DIAGNOSIS — O43123 Velamentous insertion of umbilical cord, third trimester: Secondary | ICD-10-CM | POA: Diagnosis present

## 2018-08-07 DIAGNOSIS — O288 Other abnormal findings on antenatal screening of mother: Secondary | ICD-10-CM

## 2018-08-07 DIAGNOSIS — O9902 Anemia complicating childbirth: Secondary | ICD-10-CM | POA: Diagnosis present

## 2018-08-07 DIAGNOSIS — O9832 Other infections with a predominantly sexual mode of transmission complicating childbirth: Secondary | ICD-10-CM | POA: Diagnosis present

## 2018-08-07 DIAGNOSIS — O1002 Pre-existing essential hypertension complicating childbirth: Secondary | ICD-10-CM | POA: Diagnosis present

## 2018-08-07 DIAGNOSIS — O4103X0 Oligohydramnios, third trimester, not applicable or unspecified: Secondary | ICD-10-CM | POA: Diagnosis present

## 2018-08-07 DIAGNOSIS — A6000 Herpesviral infection of urogenital system, unspecified: Secondary | ICD-10-CM | POA: Diagnosis present

## 2018-08-07 DIAGNOSIS — O139 Gestational [pregnancy-induced] hypertension without significant proteinuria, unspecified trimester: Secondary | ICD-10-CM

## 2018-08-07 LAB — PROTEIN / CREATININE RATIO, URINE
CREATININE, URINE: 175 mg/dL
Protein Creatinine Ratio: 0.44 mg/mg{Cre} — ABNORMAL HIGH (ref 0.00–0.15)
Total Protein, Urine: 77 mg/dL

## 2018-08-07 LAB — CBC
HCT: 28.5 % — ABNORMAL LOW (ref 35.0–47.0)
HEMOGLOBIN: 9.6 g/dL — AB (ref 12.0–16.0)
MCH: 27.7 pg (ref 26.0–34.0)
MCHC: 33.6 g/dL (ref 32.0–36.0)
MCV: 82.3 fL (ref 80.0–100.0)
Platelets: 295 10*3/uL (ref 150–440)
RBC: 3.46 MIL/uL — AB (ref 3.80–5.20)
RDW: 24.4 % — ABNORMAL HIGH (ref 11.5–14.5)
WBC: 7 10*3/uL (ref 3.6–11.0)

## 2018-08-07 LAB — COMPREHENSIVE METABOLIC PANEL
ALBUMIN: 2.7 g/dL — AB (ref 3.5–5.0)
ALK PHOS: 222 U/L — AB (ref 38–126)
ALT: 18 U/L (ref 0–44)
ANION GAP: 6 (ref 5–15)
AST: 31 U/L (ref 15–41)
BUN: 9 mg/dL (ref 6–20)
CALCIUM: 8.9 mg/dL (ref 8.9–10.3)
CO2: 21 mmol/L — ABNORMAL LOW (ref 22–32)
Chloride: 108 mmol/L (ref 98–111)
Creatinine, Ser: 0.68 mg/dL (ref 0.44–1.00)
GFR calc non Af Amer: >60 mL/min (ref >60)
Glucose, Bld: 100 mg/dL — ABNORMAL HIGH (ref 70–99)
Potassium: 3.9 mmol/L (ref 3.5–5.1)
Sodium: 135 mmol/L (ref 135–145)
Total Bilirubin: <0.1 mg/dL — ABNORMAL LOW (ref 0.3–1.2)
Total Protein: 6.9 g/dL (ref 6.5–8.1)

## 2018-08-07 LAB — SAMPLE TO BLOOD BANK

## 2018-08-07 LAB — LACTATE DEHYDROGENASE: LDH: 228 U/L — ABNORMAL HIGH (ref 98–192)

## 2018-08-07 LAB — URIC ACID: URIC ACID, SERUM: 8.1 mg/dL — AB (ref 2.5–7.1)

## 2018-08-07 MED ORDER — BUTORPHANOL TARTRATE 1 MG/ML IJ SOLN
1.0000 mg | INTRAMUSCULAR | Status: DC | PRN
  Administered 2018-08-08: 1 mg via INTRAVENOUS
  Filled 2018-08-07: qty 1

## 2018-08-07 MED ORDER — LABETALOL HCL 5 MG/ML IV SOLN
80.0000 mg | INTRAVENOUS | Status: DC | PRN
  Filled 2018-08-07: qty 16

## 2018-08-07 MED ORDER — SOD CITRATE-CITRIC ACID 500-334 MG/5ML PO SOLN: 30.0000 mL | ORAL | Status: DC | PRN

## 2018-08-07 MED ORDER — LACTATED RINGERS IV SOLN
INTRAVENOUS | Status: DC
  Administered 2018-08-07 – 2018-08-08 (×3): via INTRAVENOUS

## 2018-08-07 MED ORDER — MAGNESIUM SULFATE 40 G IN LACTATED RINGERS - SIMPLE
2.0000 g/h | INTRAVENOUS | Status: DC
  Administered 2018-08-08: 2 g/h via INTRAVENOUS
  Filled 2018-08-07: qty 500

## 2018-08-07 MED ORDER — OXYTOCIN 40 UNITS IN LACTATED RINGERS INFUSION - SIMPLE MED
INTRAVENOUS | Status: AC
  Filled 2018-08-07: qty 1000

## 2018-08-07 MED ORDER — INFLUENZA VAC SPLIT QUAD 0.5 ML IM SUSY: 0.5000 mL | PREFILLED_SYRINGE | INTRAMUSCULAR | Status: DC | PRN

## 2018-08-07 MED ORDER — TERBUTALINE SULFATE 1 MG/ML IJ SOLN
0.2500 mg | Freq: Once | INTRAMUSCULAR | Status: AC | PRN
  Administered 2018-08-08: 0.25 mg via SUBCUTANEOUS
  Filled 2018-08-07: qty 1

## 2018-08-07 MED ORDER — LACTATED RINGERS IV SOLN: 500.0000 mL | INTRAVENOUS | Status: DC | PRN

## 2018-08-07 MED ORDER — MAGNESIUM SULFATE BOLUS VIA INFUSION
4.0000 g | Freq: Once | INTRAVENOUS | Status: AC
  Administered 2018-08-08: 4 g via INTRAVENOUS
  Filled 2018-08-07: qty 500

## 2018-08-07 MED ORDER — ONDANSETRON HCL 4 MG/2ML IJ SOLN: 4.0000 mg | Freq: Four times a day (QID) | INTRAMUSCULAR | Status: DC | PRN

## 2018-08-07 MED ORDER — OXYTOCIN 40 UNITS IN LACTATED RINGERS INFUSION - SIMPLE MED
1.0000 m[IU]/min | INTRAVENOUS | Status: DC
  Administered 2018-08-07: 1 m[IU]/min via INTRAVENOUS

## 2018-08-07 MED ORDER — LIDOCAINE HCL (PF) 1 % IJ SOLN
INTRAMUSCULAR | Status: AC
  Filled 2018-08-07: qty 30

## 2018-08-07 MED ORDER — LIDOCAINE HCL (PF) 1 % IJ SOLN: 30.0000 mL | INTRAMUSCULAR | Status: DC | PRN

## 2018-08-07 MED ORDER — OXYTOCIN BOLUS FROM INFUSION
500.0000 mL | Freq: Once | INTRAVENOUS | Status: AC
  Administered 2018-08-08: 500 mL via INTRAVENOUS

## 2018-08-07 MED ORDER — ACETAMINOPHEN 325 MG PO TABS
650.0000 mg | ORAL_TABLET | ORAL | Status: DC | PRN
  Administered 2018-08-08: 650 mg via ORAL
  Filled 2018-08-07: qty 2

## 2018-08-07 MED ORDER — OXYTOCIN 10 UNIT/ML IJ SOLN
INTRAMUSCULAR | Status: AC
  Filled 2018-08-07: qty 2

## 2018-08-07 MED ORDER — AMMONIA AROMATIC IN INHA
RESPIRATORY_TRACT | Status: AC
  Filled 2018-08-07: qty 10

## 2018-08-07 MED ORDER — MISOPROSTOL 200 MCG PO TABS
ORAL_TABLET | ORAL | Status: AC
  Filled 2018-08-07: qty 4

## 2018-08-07 MED ORDER — LABETALOL HCL 5 MG/ML IV SOLN
20.0000 mg | INTRAVENOUS | Status: DC | PRN
  Filled 2018-08-07: qty 4

## 2018-08-07 MED ORDER — OXYTOCIN 40 UNITS IN LACTATED RINGERS INFUSION - SIMPLE MED: 2.5000 [IU]/h | INTRAVENOUS | Status: DC

## 2018-08-07 MED ORDER — LABETALOL HCL 5 MG/ML IV SOLN
40.0000 mg | INTRAVENOUS | Status: DC | PRN
  Filled 2018-08-07: qty 8

## 2018-08-07 MED ORDER — HYDRALAZINE HCL 20 MG/ML IJ SOLN: 10.0000 mg | INTRAMUSCULAR | Status: DC | PRN

## 2018-08-07 NOTE — Progress Notes (Signed)
Spoke with both Ms Tyer and the FOB about preterm birth at [redacted] weeks gestation. Outlined potential problems related to prematurity including respiratory distress, feeding difficulties and temperature regulation. Mother has received 2 doses of BMZ, has a female fetus. She has had good fetal growth by ultrasound reports. Discussed potential length of stay of several weeks in NICU. She would like to breastfeed this baby and will begin pumping after delivery.

## 2018-08-07 NOTE — Discharge Summary (Signed)
Obstetric Discharge Summary Reason for Admission: NST for [facility] w/ superimposed Preeclampsia Prenatal Procedures: NST HPI: Presents to L&D for scheduled NST. Patient reports decreased fetal movement last night. "I drank some Coke and then he started moving some more." Patient reports that overall fetal movement has changed and states she has to "work harder to get him to move."  Intrapartum Procedures: N/A Postpartum Procedures: N/A Complications-Operative and Postpartum: N/A Hemoglobin  Date Value Ref Range Status  08/05/2018 8.9 (L) 12.0 - 16.0 g/dL Final   HGB  Date Value Ref Range Status  07/03/2014 11.5 (L) 12.0 - 16.0 g/dL Final   HCT  Date Value Ref Range Status  08/05/2018 28.2 (L) 35.0 - 47.0 % Final  07/03/2014 34.7 (L) 35.0 - 47.0 % Final     Physical Exam:  Gen:A,A&Ox3 HEENT: Normocephalic, Eyes non-icteric. HEART:S1S2, RRR, No M/R/G LUNGS:CTA bilat, no W/R/R ABD: gravid  Extrems:warm, dry, NT, Neg Homan's   Discharge Diagnoses: Chronic HTN with superimposed preeclampsia; BPP 10/10; NST reactive  Discussed plan of care with Dr. Leafy Ro.  Decision made to induce labor due to reports of changes in fetal movement, CHTN w/ superimposed preeclampsia, and history of IUFD in 10/2017.  Discharge Information: Date: 08/07/2018 Activity: Up ad lib Diet: routine Medications: Continue Procardia and labetalol  Condition: stable Instructions: Return this evening to L&D for induction of labor  Discharge to: home and f/u this evening for scheduled IOL  [name] 08/07/2018, 11:54 AM

## 2018-08-07 NOTE — Progress Notes (Signed)
Sophea Rackham is a 37 y.o. [id number] at [redacted]w[redacted]d by ultrasound admitted for induction of labor due to chronic HTN w/ superimposed preeclampsia.  Subjective: Contractions becoming more uncomfortable, resting in bed.  Objective: BP 114/63 (BP Location: Right Arm)   Pulse 82   Temp 98 F (36.7 C) (Oral)   Resp 14   Ht 5\' 2"  (1.575 m)   Wt 92.5 kg   LMP 11/30/2017 Comment: irregular periods  BMI 37.31 kg/m  I/O last 3 completed shifts: In: 415.1 [P.O.:125; I.V.:290.1] Out: -  No intake/output data recorded.  FHT:  FHR: 130 bpm, variability: minimal ,  accelerations:  Abscent,  decelerations:  Present Variables  UC:   Difficult to assess, toco adjusted  SVE:    deferred   Labs: Lab Results  Component Value Date   WBC 7.0 08/07/2018   HGB 9.6 (L) 08/07/2018   HCT 28.5 (L) 08/07/2018   MCV 82.3 08/07/2018   PLT 295 08/07/2018    Assessment / Plan: Induction of labor due to preeclampsia,  progressing well on pitocin  Labor: Progressing on Pitocin, will continue to increase then AROM, will consider IUPC to monitor contractions  Preeclampsia:  stable, will start Mag Sulfate infusion with active labor  Fetal Wellbeing:  Category II Pain Control:  Labor support without medications I/D:  n/a Anticipated MOD:  NSVD  [name] 08/07/2018, 9:29 PM

## 2018-08-07 NOTE — Progress Notes (Signed)
Anani Gu is a 37 y.o. [id number] at [redacted]w[redacted]d  admitted for IOL.  Subjective: "Comfortable and watching TV  Objective: BP 128/77   Pulse 80   Temp 98.8 F (37.1 C) (Oral)   Resp 14   Ht 5\' 2"  (1.575 m)   Wt 92.5 kg   LMP 11/30/2017 Comment: irregular periods  BMI 37.31 kg/m  No intake/output data recorded. No intake/output data recorded.  FHT: 130, Ct 2 strip, variable x 1 with recovery, 1 prior late decel with recovery noted UC:  irreg SVE:    2/50%/vtx-3  Labs: Lab Results  Component Value Date   WBC 7.5 08/05/2018   HGB 8.9 (L) 08/05/2018   HCT 28.2 (L) 08/05/2018   MCV 76.1 (L) 08/05/2018   PLT 258 08/05/2018  Pelivc: BUS: no lesions, clear Labia and vagina and cx evalauted with speculum exam and no herpetic lesions noted.  Assessment / Plan: A:1. IUP at 33 2/7 weeks 2. CHTN with superimprosed pre-ex 3. GBS neg 4. BPP 10/10 today but, took 2 hours for NST to be reactive with pt complaint of decreased fM P: Disc with Dr Leafy Ro and agrees with plan of care. Wants pt on MagSO4 once she becomes active. 2. Pitocin at 2 mun/min currently. __________________________________  Danford Bad, MSN, CNM, FNP Certified Nurse Midwife [facility] [place] 08/07/2018, 6:47 PM

## 2018-08-07 NOTE — H&P (Signed)
Cindy Hays is a 37 y.o. female presenting for induction of labor due to chronic HTN with superimposed preeclampsia and nonreassuring fetal heart strip, becoming reactive after 2 hours. AFI yesterday 5.3 w/ BPP 8/10.  Concerns for decreased fetal movement per patient last night.  Repeat BPP today 10/10 w/ AFI 8.9cm.  Patient reports change in overall fetal movement stating "I have to work to get him to move." Disc plan of care with Dr Leafy Ro today and agrees that it is time to induce.  Past Medical History:  Diagnosis Date  . Anemia   . Hypertension   . Preterm labor    Past Surgical History:  Procedure Laterality Date  . NO PAST SURGERIES      OB History    Gravida  6   Para  5   Term  4   Preterm  0   AB      Living  4     SAB      TAB      Ectopic      Multiple  0   Live Births             Family History: family history includes Dementia in her maternal grandmother; Diabetes in her maternal grandmother; Hypertension in her maternal grandmother and mother. Social History:  reports that she has never smoked. She has never used smokeless tobacco. She reports that she drank alcohol. She reports that she does not use drugs.  Social History   Socioeconomic History  . Marital status: Single    Spouse name: Not on file  . Number of children: Not on file  . Years of education: Not on file  . Highest education level: Not on file  Occupational History  . Not on file  Social Needs  . Financial resource strain: Not on file  . Food insecurity:    Worry: Not on file    Inability: Not on file  . Transportation needs:    Medical: Not on file    Non-medical: Not on file  Tobacco Use  . Smoking status: Never Smoker  . Smokeless tobacco: Never Used  Substance and Sexual Activity  . Alcohol use: Not Currently  . Drug use: No  . Sexual activity: Not Currently    Birth control/protection: Surgical    Comment: Tubal ligation  Lifestyle  . Physical activity:     Days per week: Not on file    Minutes per session: Not on file  . Stress: Not on file  Relationships  . Social connections:    Talks on phone: Not on file    Gets together: Not on file    Attends religious service: Not on file    Active member of club or organization: Not on file    Attends meetings of clubs or organizations: Not on file    Relationship status: Not on file  . Intimate partner violence:    Fear of current or ex partner: Not on file    Emotionally abused: Not on file    Physically abused: Not on file    Forced sexual activity: Not on file  Other Topics Concern  . Not on file  Social History Narrative  . Not on file     Maternal Diabetes: No Genetic Screening: Normal Maternal Ultrasounds/Referrals: Normal Fetal Ultrasounds or other Referrals:   Maternal Substance Abuse:  None  Significant Maternal Medications:  Meds include: Other:   Allergies  Allergen Reactions  . Amoxicillin Rash  .  Vicodin [Hydrocodone-Acetaminophen] Rash   Scheduled Meds: Current Meds  Medication Sig  . aspirin 81 MG chewable tablet Chew by mouth daily.  . ferrous sulfate 220 (44 Fe) MG/5ML solution Take 220 mg by mouth daily.  Marland Kitchen labetalol (NORMODYNE) 200 MG tablet Take 2 tablets (400 mg total) by mouth 2 (two) times daily.  Marland Kitchen NIFEdipine (PROCARDIA XL/ADALAT-CC) 90 MG 24 hr tablet Take 1 tablet (90 mg total) by mouth daily.  . Prenatal Vit-Fe Fumarate-FA (PRENATAL MULTIVITAMIN) TABS tablet Take 1 tablet by mouth daily at 12 noon.  . valACYclovir (VALTREX) 500 MG tablet Take 1 tablet (500 mg total) by mouth 2 (two) times daily.    Significant Maternal Lab Results:  Lab values include: Group B Strep negative   Other Comments:  None  Review of Systems  Constitutional: Negative.   HENT: Negative.   Eyes: Negative.   Respiratory: Negative.   Cardiovascular: Negative.   Gastrointestinal: Negative.   Genitourinary: Negative.   Skin: Negative.   Neurological: Negative.     History   Blood pressure 117/82, pulse 92, temperature 98.8 F (37.1 C), temperature source Oral, resp. rate 14, height 5\' 2"  (1.575 m), weight 92.5 kg, last menstrual period 11/30/2017. Exam Physical Exam   Gen:A,A&Ox3 HEENT: Normocephalic, Eyes non-icteric. HEART:S1S2, RRR, No M/R/G LUNGS:CTA bilat, no W/R/R ABD: gravid, EFW:4#0oz   DTRs: 2+/2+, No edema Clonus: negative RUQ pain: denies  Extrems:warm, dry, NT, Neg Homan's Skin: no rashes, no lesions  Prenatal labs: ABO, Rh: A Pos Antibody: Neg Rubella: Immune RPR: Non-Reactive  HBsAg: Negative  HIV: Non-Reactive  Varicella: Immune  GBS:   Negative     Assessment/Plan: A. 1. IUP 33 2/7 weeks 2.  CHTN w/ superimposed preeclampsia 3. 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 GBS negative 5. Obesity  6. Decreased fetal movement  7. Nonreactive NST w/ reactivity after 2 hours 8. Oligohydramnios yesterday, improved today  9. Anemia  10. Betamethasone complete   P. 1. Admit to L&D for IOL with oxytocin per protocol.  2. Continue to monitor VS, FHT's, and UC's 3. Preeclampsia: will start mag sulfate with active labor  4. Chronic HTN: will manage with labetalol  5. AROM when able 6. GBS negative, no abx indicated  7. Manage blood loss with misoprostol if needed and iron postpartum \ 8. Plans IV stadol for pain  9. CBC, CMP, Urine P/C ratio, type and screen  10. Neonatology consult    ___________________________________  Danford Bad, MSN, CNM, FNP Certified Nurse Midwife [facility] [place] 08/07/2018, 5:42 PM

## 2018-08-08 MED ORDER — CALCIUM GLUCONATE 10 % IV SOLN
INTRAVENOUS | Status: AC
  Filled 2018-08-08: qty 10

## 2018-08-08 MED ORDER — SODIUM CHLORIDE 0.9% FLUSH
3.0000 mL | Freq: Two times a day (BID) | INTRAVENOUS | Status: DC
  Administered 2018-08-09: 3 mL via INTRAVENOUS

## 2018-08-08 MED ORDER — FLEET ENEMA 7-19 GM/118ML RE ENEM: 1.0000 | ENEMA | Freq: Every day | RECTAL | Status: DC | PRN

## 2018-08-08 MED ORDER — SODIUM CHLORIDE 0.9 % IV SOLN: 250.0000 mL | INTRAVENOUS | Status: DC | PRN

## 2018-08-08 MED ORDER — BENZOCAINE-MENTHOL 20-0.5 % EX AERO: 1.0000 "application " | INHALATION_SPRAY | CUTANEOUS | Status: DC | PRN

## 2018-08-08 MED ORDER — COCONUT OIL OIL: 1.0000 "application " | TOPICAL_OIL | Status: DC | PRN

## 2018-08-08 MED ORDER — WITCH HAZEL-GLYCERIN EX PADS: 1.0000 "application " | MEDICATED_PAD | CUTANEOUS | Status: DC | PRN

## 2018-08-08 MED ORDER — ZOLPIDEM TARTRATE 5 MG PO TABS: 5.0000 mg | ORAL_TABLET | Freq: Every evening | ORAL | Status: DC | PRN

## 2018-08-08 MED ORDER — SENNOSIDES-DOCUSATE SODIUM 8.6-50 MG PO TABS
2.0000 | ORAL_TABLET | ORAL | Status: DC
  Administered 2018-08-09: 2 via ORAL
  Filled 2018-08-08: qty 2

## 2018-08-08 MED ORDER — SODIUM CHLORIDE 0.9% FLUSH: 3.0000 mL | INTRAVENOUS | Status: DC | PRN

## 2018-08-08 MED ORDER — ONDANSETRON HCL 4 MG PO TABS: 4.0000 mg | ORAL_TABLET | ORAL | Status: DC | PRN

## 2018-08-08 MED ORDER — SIMETHICONE 80 MG PO CHEW: 80.0000 mg | CHEWABLE_TABLET | ORAL | Status: DC | PRN

## 2018-08-08 MED ORDER — DIBUCAINE 1 % RE OINT: 1.0000 "application " | TOPICAL_OINTMENT | RECTAL | Status: DC | PRN

## 2018-08-08 MED ORDER — ACETAMINOPHEN 325 MG PO TABS: 650.0000 mg | ORAL_TABLET | ORAL | Status: DC | PRN

## 2018-08-08 MED ORDER — IBUPROFEN 600 MG PO TABS
600.0000 mg | ORAL_TABLET | Freq: Four times a day (QID) | ORAL | Status: DC
  Administered 2018-08-08 – 2018-08-09 (×4): 600 mg via ORAL
  Filled 2018-08-08 (×4): qty 1

## 2018-08-08 MED ORDER — DIPHENHYDRAMINE HCL 25 MG PO CAPS: 25.0000 mg | ORAL_CAPSULE | Freq: Four times a day (QID) | ORAL | Status: DC | PRN

## 2018-08-08 MED ORDER — ONDANSETRON HCL 4 MG/2ML IJ SOLN: 4.0000 mg | INTRAMUSCULAR | Status: DC | PRN

## 2018-08-08 MED ORDER — BISACODYL 10 MG RE SUPP: 10.0000 mg | Freq: Every day | RECTAL | Status: DC | PRN

## 2018-08-08 MED ORDER — MAGNESIUM SULFATE 40 G IN LACTATED RINGERS - SIMPLE
2.0000 g/h | INTRAVENOUS | Status: DC
  Administered 2018-08-08: 2 g/h via INTRAVENOUS
  Filled 2018-08-08: qty 500

## 2018-08-08 MED ORDER — PRENATAL MULTIVITAMIN CH
1.0000 | ORAL_TABLET | Freq: Every day | ORAL | Status: DC
  Administered 2018-08-08 – 2018-08-09 (×2): 1 via ORAL
  Filled 2018-08-08 (×2): qty 1

## 2018-08-08 NOTE — Progress Notes (Signed)
Cindy Hays is a 37 y.o. [id number] at [redacted]w[redacted]d by ultrasound admitted for induction of labor due to [facility] w/ superimposed preeclampsia.  Subjective: Called to labor room for prolong decleration. Reports worsening contractions.   Objective: BP 122/78 (BP Location: Left Arm)   Pulse 75   Temp 98.1 F (36.7 C) (Oral)   Resp 14   Ht 5\' 2"  (1.575 m)   Wt 92.5 kg   LMP 11/30/2017 Comment: irregular periods  BMI 37.31 kg/m  I/O last 3 completed shifts: In: 415.1 [P.O.:125; I.V.:290.1] Out: -  No intake/output data recorded.  FHT:  FHR: 120 bpm, variability: moderate,  accelerations:  Present,  decelerations:  Absent currently  Called to labor room for prolong deceleration to 75bpm w/ gradual return to baseline at 120bpm.  Uterine tachysystole noted. Oxytocin discontinued, pt repositioned to lateral position, LR IVF fluid bolus given, 02 applied at 10L/min via non-rebreather face mask, terb 0.25mg  given subcut.   UC:   regular, every 2.5-5 minutes SVE:   Dilation: 3 Effacement (%): 80 Exam by:: Cindy Hays, CNM  Labs: Lab Results  Component Value Date   WBC 7.0 08/07/2018   HGB 9.6 (L) 08/07/2018   HCT 28.5 (L) 08/07/2018   MCV 82.3 08/07/2018   PLT 295 08/07/2018    Assessment / Plan Labor: Regular contractions, oxytocin remains off   Plan to leave oxytocin off for one hour.  Will restart and titrate to avoid uterine tachysystole.  Preeclampsia:  on magnesium sulfate and stable Fetal Wellbeing:  Category I now  Pain Control:  Labor support without medications I/D:  n/a  Anticipated MOD:  NSVD  Cindy Hays 08/08/2018, 1:39 AM

## 2018-08-08 NOTE — Progress Notes (Signed)
Post Partum Day  Delivery day Subjective: Feeling well and baby is doing well  Objective: Blood pressure 131/72, pulse 78, temperature 98.2 F (36.8 C), temperature source Axillary, resp. rate 16, height 5\' 2"  (1.575 m), weight 92.5 kg, last menstrual period 11/30/2017, unknown if currently breastfeeding.  Physical Exam:  General: alert and cooperative  HEART:S1S2,RRR, no M/R/G LUNGS:CTA BILAT, NO W/R/R Lochia: mod, no clots Uterine Fundus: FF Lacs: none DVT Evaluation: Neg Homans  Recent Labs    08/07/18 1644  HGB 9.6*  HCT 28.5*    Assessment/Plan: A:1. Delivery Day S/P NSVD of viable female with intact perinueum 2. Severe Pre-ecclampsia with MagSO4 at 2 gms an hour till 0630am 3. BP's improved P:1. Continue to monitor output and VS. 2. Transfer to PP in am   LOS: 1 day   [name] 08/08/2018, 6:09 PM

## 2018-08-08 NOTE — Progress Notes (Addendum)
Cindy Hays is a 37 y.o. [id number] at [redacted]w[redacted]d by ultrasound admitted for induction of labor due to Chronic HTN with superimposed preeclampsia.  Subjective: Feeling uterine contractions in back   Objective: BP 124/61 (BP Location: Right Arm)   Pulse 72   Temp 98.1 F (36.7 C) (Oral)   Resp 14   Ht 5\' 2"  (1.575 m)   Wt 92.5 kg   LMP 11/30/2017 Comment: irregular periods  BMI 37.31 kg/m  I/O last 3 completed shifts: In: 415.1 [P.O.:125; I.V.:290.1] Out: -  No intake/output data recorded.  FHT:  FHR:120, no accels, Variable decels with mod variabilty and recovery Cat 2  UC:   regular, every 2-4 minutes SVE:   Dilation: 3 Effacement (%): 80 Exam by:: [name], CNM  Labs: Lab Results  Component Value Date   WBC 7.0 08/07/2018   HGB 9.6 (L) 08/07/2018   HCT 28.5 (L) 08/07/2018   MCV 82.3 08/07/2018   PLT 295 08/07/2018    Assessment / Plan: Induction of labor due to preeclampsia,  progressing well on pitocin  Labor: Progressing normally and IUPC placed. AROM with FSE 2330.  FSE not tracing adequately. External [place] applied and FSE removed.  Preeclampsia:  labs stable Fetal Wellbeing:  Category II Pain Control:  Labor support without medications I/D:  n/a Anticipated MOD:  NSVD  At 1210: notified that fetus is having deep variables which has recovered after repositioning. Will continue to monitor and attempt amnioinfusion 500 ml's NS and 100 ml's hour to replace fluid from AROM. _____________________________________________________  Danford Bad, MSN, CNM, FNP Certified Nurse Midwife [facility] [place] 08/08/2018, 12:00 AM

## 2018-08-08 NOTE — Discharge Summary (Signed)
Obstetrical Discharge Summary  Patient Name: Cindy Hays DOB: 1981/04/03 MRN: [id number]  Date of Admission: 08/07/2018 Date of Delivery: 08/08/2018 Delivered by: Drinda Butts SNM and Glenis Smoker CNM  Date of Discharge: 08/09/18 Primary OB: [facility] DSK:[id number] last menstrual period was 11/30/2017. EDC Estimated Date of Delivery: 09/23/18 Gestational Age at Delivery: [redacted]w[redacted]d  Antepartum complications: chronic HTN with superimposed preeclampsia on two b/p medications, decreased fetal movement, obesity, anemia, nonreassuring fetal heart tracing, Hx of HSV, social issues with FOB  Admitting Diagnosis:  Secondary Diagnosis: Patient Active Problem List   Diagnosis Date Noted  . Pregnancy 08/04/2018  . Indication for care in labor or delivery 08/03/2018  . Labor and delivery indication for care or intervention 08/02/2018  . Chronic hypertension with superimposed pre-eclampsia 07/30/2018  . Chronic hypertension with superimposed preeclampsia 07/21/2018  . Short interval between pregnancies affecting pregnancy in first trimester, antepartum 03/18/2018  . Advanced maternal age in multigravida, first trimester 03/18/2018  . Fetal demise, greater than 22 weeks, antepartum, single gestation 11/13/2017    Augmentation: Pt had nonreassuring FHR x 2 hours with eventual reactivity, AFI 8.9cm prior, variability of baby in question. Pt reporting decreased fetal movement. Discussed with Dr. BLeafy Roand advised IOL was indicated. Cervix 2/50/-3, vertex. Pitocin per iduction protocol, GBS neg. B/P's were stable, however, was started on Mag Sulfate in active labor for severe Preeclampsia. One episode of tachysystole required terb and pit off for 1 hour. Pitocin resumed with variable decels, cat II strip, and eventually second segment of tachysystole w/ FHR decels in 60's.  At that time patient felt like the baby was coming. Prepared quickly for delivery. NICU and respiratory called. Patient  prepared for delivery.  Complications: AMA, CHTN on labetalol and nifedipine, Fetal status nonreactive on multiple occasions w/ AFI of 8.9cm and BPP 10/10.  B/P managed with mag sulfate in labor and postpartum. Preterm delivery at 3263/7 and infant requiring NICU care. Anemia managed by PO Iron replacement  Intrapartum complications/course:  Date of Delivery: 08/08/2018 Delivered By: ADrinda Butts SNM and CGlenis SmokerCNM  Delivery Type: spontaneous vaginal delivery Anesthesia: None  Placenta: sponatneous Laceration: None  Episiotomy: none Newborn Data: Live born female  Birth Weight: 3 lb 9.1 oz (1620 g) APGAR: 8, 9  Newborn Delivery   Birth date/time:  08/08/2018 05:25:00 Delivery type:  Vaginal, Spontaneous       Postpartum Procedures: N/A  Post partum course: Stable Pt remained on Magnesium for 24 hrs and did not require BP meds PP . No visual disturbances , no H/A Patient had an uncomplicated postpartum course.  By time of discharge on PPD#1, her pain was controlled on oral pain medications; she had appropriate lochia and was ambulating, voiding without difficulty and tolerating regular diet.  She was deemed stable for discharge to home.    Discharge Physical Exam: BP 129/84 (BP Location: Left Arm)   Pulse 88   Temp 98.8 F (37.1 C) (Oral)   Resp 18   Ht _0  (1.575 m)   Wt 92.5 kg   LMP 11/30/2017 Comment: irregular periods  SpO2 96%   Breastfeeding? Unknown   BMI 37.31 kg/m   General: NAD CV: RRR Pulm: CTABL, nl effort ABD: s/nd/nt, fundus firm and below the umbilicus Lochia: moderate  DVT Evaluation: LE non-ttp, no evidence of DVT on exam. Results for orders placed or performed during the hospital encounter of 08/07/18 (from the past 72 hour(s))  Sample to Blood Bank     Status:  None   Collection Time: 08/07/18  4:44 PM  Result Value Ref Range   Blood Bank Specimen SAMPLE AVAILABLE FOR TESTING    Sample Expiration      08/10/2018 Performed at [facility], [address] [place], [place] [address]   CBC     Status: Abnormal   Collection Time: 08/07/18  4:44 PM  Result Value Ref Range   WBC 7.0 3.6 - 11.0 K/uL   RBC 3.46 (L) 3.80 - 5.20 MIL/uL   Hemoglobin 9.6 (L) 12.0 - 16.0 g/dL   HCT 28.5 (L) 35.0 - 47.0 %   MCV 82.3 80.0 - 100.0 fL   MCH 27.7 26.0 - 34.0 pg   MCHC 33.6 32.0 - 36.0 g/dL   RDW 24.4 (H) 11.5 - 14.5 %   Platelets 295 150 - 440 K/uL    Comment: Performed at [facility], [address] [place], [place] [address]  Comprehensive metabolic panel     Status: Abnormal   Collection Time: 08/07/18  4:44 PM  Result Value Ref Range   Sodium 135 135 - 145 mmol/L   Potassium 3.9 3.5 - 5.1 mmol/L   Chloride 108 98 - 111 mmol/L   CO2 21 (L) 22 - 32 mmol/L   Glucose, Bld 100 (H) 70 - 99 mg/dL   BUN 9 6 - 20 mg/dL   Creatinine, Ser 0.68 0.44 - 1.00 mg/dL   Calcium 8.9 8.9 - 10.3 mg/dL   Total Protein 6.9 6.5 - 8.1 g/dL   Albumin 2.7 (L) 3.5 - 5.0 g/dL   AST 31 15 - 41 U/L   ALT 18 0 - 44 U/L   Alkaline Phosphatase 222 (H) 38 - 126 U/L   Total Bilirubin <0.1 (L) 0.3 - 1.2 mg/dL   GFR calc non Af Amer >60 >60 mL/min   GFR calc Af Amer >60 >60 mL/min    Comment: (NOTE) The eGFR has been calculated using the CKD EPI equation. This calculation has not been validated in all clinical situations. eGFR's persistently <60 mL/min signify possible Chronic Kidney Disease.    Anion gap 6 5 - 15    Comment: Performed at [facility], [address] , [place] [address]  Lactate dehydrogenase     Status: Abnormal   Collection Time: 08/07/18  4:44 PM  Result Value Ref Range   LDH 228 (H) 98 - 192 U/L    Comment: Performed at [facility], [address] [place], [place] [address]  Uric acid     Status: Abnormal   Collection Time: 08/07/18  4:44 PM  Result Value Ref Range   Uric Acid, Serum 8.1 (H) 2.5 - 7.1 mg/dL    Comment: Performed at [facility], [address] [place], [place] [address]  Protein / creatinine ratio, urine     Status: Abnormal   Collection Time: 08/07/18  4:44 PM  Result Value Ref Range   Creatinine, Urine 175 mg/dL   Total Protein, Urine 77 mg/dL    Comment: NO NORMAL RANGE ESTABLISHED FOR THIS TEST   Protein Creatinine Ratio 0.44 (H) 0.00 - 0.15 mg/mg[Cre]    Comment: Performed at [facility], [address] [place], [place] [address]  Type and screen     Status: None (Preliminary result)   Collection Time: 08/07/18  4:44 PM  Result Value Ref Range   ABO/RH(D) A POS    Antibody Screen NEG    Sample Expiration 08/10/2018  Unit Number [id number]    Blood Component Type RED CELLS,LR    Unit division 00    Status of Unit ALLOCATED    Transfusion Status OK TO TRANSFUSE    Crossmatch Result      COMPATIBLE Performed at [facility], [address] [place], [place] [address]    Unit Number [id number]    Blood Component Type RED CELLS,LR    Unit division 00    Status of Unit ALLOCATED    Transfusion Status OK TO TRANSFUSE    Crossmatch Result COMPATIBLE   CBC     Status: Abnormal   Collection Time: 08/09/18  4:30 AM  Result Value Ref Range   WBC 10.9 3.6 - 11.0 K/uL   RBC 3.43 (L) 3.80 - 5.20 MIL/uL   Hemoglobin 8.5 (L) 12.0 - 16.0 g/dL   HCT 26.6 (L) 35.0 - 47.0 %   MCV 77.6 (L) 80.0 - 100.0 fL   MCH 24.8 (L) 26.0 - 34.0 pg   MCHC 32.0 32.0 - 36.0 g/dL   RDW 25.2 (H) 11.5 - 14.5 %   Platelets 243 150 - 440 K/uL    Comment: Performed at [facility], [address] [place], [place] [address]   Hemoglobin  Date Value Ref Range Status  08/09/2018 8.5 (L) 12.0 - 16.0 g/dL Final   HGB  Date Value Ref Range Status  07/03/2014 11.5 (L) 12.0 - 16.0 g/dL Final   HCT  Date Value Ref Range Status  08/09/2018 26.6 (L) 35.0 - 47.0 % Final  07/03/2014 34.7 (L) 35.0 - 47.0 % Final     Disposition: stable, discharge to home. Baby Feeding: breastmilk Baby Disposition: NICU    Rh Immune globulin given: Rh positive  Rubella vaccine given: Rubella Immune  Tdap vaccine given in AP or PP setting: given 06/24/2018 Flu vaccine given in AP or PP setting: at discharge   Contraception:desires PP BTL ( interval )   Prenatal Labs:  Type and Rh: A pos Antibody: Negative  Rubella: Immune RPR: Nonreactive Heb B: Negative HIV: Nonreactive Varicella: Immune GBS: Negative   Plan:  Cindy Hays was discharged to home in good condition. Follow-up appointment with delivering provider in 6 weeks.  Discharge Medications: Allergies as of 08/09/2018      Reactions   Amoxicillin Rash   Vicodin [hydrocodone-acetaminophen] Rash      Medication List    STOP taking these medications   aspirin 81 MG chewable tablet   ferrous sulfate 220 (44 Fe) MG/5ML solution   labetalol 200 MG tablet Commonly known as:  NORMODYNE   NIFEdipine 90 MG 24 hr tablet Commonly known as:  PROCARDIA XL/ADALAT-CC   valACYclovir 500 MG tablet Commonly known as:  VALTREX     TAKE these medications   albuterol 108 (90 Base) MCG/ACT inhaler Commonly known as:  PROVENTIL HFA;VENTOLIN HFA Inhale 2 puffs into the lungs every 4 (four) hours as needed for wheezing.   ibuprofen 600 MG tablet Commonly known as:  ADVIL,MOTRIN Take 1 tablet (600 mg total) by mouth every 6 (six) hours.   prenatal multivitamin Tabs tablet Take 1 tablet by mouth daily at 12 noon.       Follow-up Information    Ward, Honor Loh, MD Follow up in 1 week(s).   Specialty:  Obstetrics and Gynecology Why:  BP recheck and schedule BTL  Contact information: [place] [address] [phone]           Signed: Marcello Moores  Lenna Sciara Eithan Beagle MD

## 2018-08-09 LAB — CBC
HCT: 26.6 % — ABNORMAL LOW (ref 35.0–47.0)
HEMOGLOBIN: 8.5 g/dL — AB (ref 12.0–16.0)
MCH: 24.8 pg — AB (ref 26.0–34.0)
MCHC: 32 g/dL (ref 32.0–36.0)
MCV: 77.6 fL — AB (ref 80.0–100.0)
Platelets: 243 10*3/uL (ref 150–440)
RBC: 3.43 MIL/uL — ABNORMAL LOW (ref 3.80–5.20)
RDW: 25.2 % — ABNORMAL HIGH (ref 11.5–14.5)
WBC: 10.9 10*3/uL (ref 3.6–11.0)

## 2018-08-09 MED ORDER — IBUPROFEN 600 MG PO TABS: 600.0000 mg | ORAL_TABLET | Freq: Four times a day (QID) | ORAL | 0 refills | Status: DC

## 2018-08-09 NOTE — Discharge Instructions (Signed)
Please call your doctor or return to the ER if you experience any chest pains, shortness of breath, dizziness, visual changes, fever greater than 101, any heavy bleeding (saturating more than 1 pad per hour), large clots, or foul smelling discharge, any worsening abdominal pain and cramping that is not controlled by pain medication, or any signs of postpartum depression. No tampons, enemas, douches, or sexual intercourse for 6 weeks. Also avoid tub baths, hot tubs, or swimming for 6 weeks.  °

## 2018-08-09 NOTE — Progress Notes (Signed)
Discharge order received from doctor. Breast pump given at discharge. Pt declined flu vaccine she stated she would get it at her OBGYN office or at work. Reviewed discharge instructions and prescriptions with patient and answered all questions. Follow up appointment given. Patient verbalized understanding.. Patient discharged home (without infant; infant in SCN) via wheelchair by nursing/auxillary.     Hilbert Bible, RN

## 2018-08-09 NOTE — Lactation Note (Signed)
Lactation Consultation Note  Patient Name: Cindy Hays NIDPO'E Date: 08/09/2018  Mom loaned Medela Symphony pump x 1 wk until she can obtain a pump through [facility] so she can pump her breasts at home while baby is in SCN, rental agreement signed and copy given to mom   Maternal Data    Feeding    LATCH Score                   Interventions    Lactation Tools Discussed/Used     Consult Status      Ferol Luz 08/09/2018, 6:43 PM

## 2018-08-10 LAB — TYPE AND SCREEN
ABO/RH(D): A POS
Antibody Screen: NEGATIVE
Unit division: 0
Unit division: 0

## 2018-08-10 LAB — BPAM RBC
BLOOD PRODUCT EXPIRATION DATE: 201910202359
Blood Product Expiration Date: 201910042359
UNIT TYPE AND RH: 6200
Unit Type and Rh: 6200

## 2018-08-11 LAB — SURGICAL PATHOLOGY

## 2018-11-27 ENCOUNTER — Other Ambulatory Visit: Payer: Self-pay

## 2018-11-27 ENCOUNTER — Encounter: Payer: Self-pay | Admitting: Emergency Medicine

## 2018-11-27 DIAGNOSIS — R42 Dizziness and giddiness: Secondary | ICD-10-CM | POA: Insufficient documentation

## 2018-11-27 DIAGNOSIS — I10 Essential (primary) hypertension: Secondary | ICD-10-CM | POA: Insufficient documentation

## 2018-11-27 DIAGNOSIS — R202 Paresthesia of skin: Secondary | ICD-10-CM | POA: Insufficient documentation

## 2018-11-27 DIAGNOSIS — R51 Headache: Secondary | ICD-10-CM | POA: Insufficient documentation

## 2018-11-27 NOTE — ED Triage Notes (Signed)
Pt arrives POV to triage and ambulatory. Pt reports that her BP reports that she took some left over labetalol that she had when she was pregnant with her now three month old. Pt reports that her pressure was 180/? At home. Pt is in NAD.

## 2018-11-27 NOTE — ED Notes (Signed)
Pt reports that her prescription was for 200 mg and that she took x 2 of those.

## 2018-11-28 ENCOUNTER — Emergency Department
Admission: EM | Admit: 2018-11-28 | Discharge: 2018-11-28 | Disposition: A | Discharge status: Home / Self Care | Payer: Medicaid Other | Attending: Student in an Organized Health Care Education/Training Program | Admitting: Student in an Organized Health Care Education/Training Program

## 2018-11-28 DIAGNOSIS — I10 Essential (primary) hypertension: Secondary | ICD-10-CM

## 2018-11-28 LAB — CBC WITH DIFFERENTIAL/PLATELET
Abs Immature Granulocytes: 0.02 10*3/uL (ref 0.00–0.07)
BASOS PCT: 0 %
Basophils Absolute: 0 10*3/uL (ref 0.0–0.1)
Eosinophils Absolute: 0.1 10*3/uL (ref 0.0–0.5)
Eosinophils Relative: 1 %
HCT: 29.4 % — ABNORMAL LOW (ref 36.0–46.0)
Hemoglobin: 8.8 g/dL — ABNORMAL LOW (ref 12.0–15.0)
Immature Granulocytes: 0 %
Lymphocytes Relative: 32 %
Lymphs Abs: 1.9 10*3/uL (ref 0.7–4.0)
MCH: 24.4 pg — ABNORMAL LOW (ref 26.0–34.0)
MCHC: 29.9 g/dL — AB (ref 30.0–36.0)
MCV: 81.4 fL (ref 80.0–100.0)
MONO ABS: 0.6 10*3/uL (ref 0.1–1.0)
Monocytes Relative: 10 %
Neutro Abs: 3.3 10*3/uL (ref 1.7–7.7)
Neutrophils Relative %: 57 %
PLATELETS: 283 10*3/uL (ref 150–400)
RBC: 3.61 MIL/uL — AB (ref 3.87–5.11)
RDW: 15.4 % (ref 11.5–15.5)
WBC: 5.8 10*3/uL (ref 4.0–10.5)
nRBC: 0 % (ref 0.0–0.2)

## 2018-11-28 LAB — BASIC METABOLIC PANEL
Anion gap: 7 (ref 5–15)
BUN: 9 mg/dL (ref 6–20)
CO2: 20 mmol/L — ABNORMAL LOW (ref 22–32)
Calcium: 8.7 mg/dL — ABNORMAL LOW (ref 8.9–10.3)
Chloride: 110 mmol/L (ref 98–111)
Creatinine, Ser: 0.53 mg/dL (ref 0.44–1.00)
GFR calc Af Amer: >60 mL/min (ref >60)
GFR calc non Af Amer: >60 mL/min (ref >60)
Glucose, Bld: 104 mg/dL — ABNORMAL HIGH (ref 70–99)
Potassium: 3.5 mmol/L (ref 3.5–5.1)
Sodium: 137 mmol/L (ref 135–145)

## 2018-11-28 MED ORDER — SODIUM CHLORIDE 0.9 % IV BOLUS: 500.0000 mL | Freq: Once | INTRAVENOUS | Status: DC

## 2018-11-28 NOTE — ED Notes (Signed)
Patient given update on wait time. Patient verbalizes understanding. Patient given warm blanket.

## 2018-11-28 NOTE — ED Notes (Signed)
Patient given update on wait time. Patient verbalizes understanding.  

## 2018-11-28 NOTE — ED Provider Notes (Signed)
[facility] Emergency Department Provider Note    First MD Initiated Contact with Patient 11/28/18 [phone]     (approximate)  I have reviewed the triage vital signs and the nursing notes.   HISTORY  Chief Complaint Hypertension    HPI Cindy Hays is a 38 y.o. female with a history of gestational hypertension presents the ER for evaluation after the patient to for previously prescribed labetalol for elevated blood pressure earlier today.  Patient states that she was having headache and tingling in her arms.  She checked her blood pressure and it was 400 systolic.  Started feeling anxious says she went and took the blood pressure medication.  States that after she took that roughly 1 hour later she started feeling lightheaded and dizzy.  Checked her blood pressure again and it was low.  Denies any nausea vomiting.  No chest pain.    Past Medical History:  Diagnosis Date  . Anemia   . Hypertension   . Preterm labor    Family History  Problem Relation Age of Onset  . Hypertension Mother   . Hypertension Maternal Grandmother   . Diabetes Maternal Grandmother   . Dementia Maternal Grandmother    Past Surgical History:  Procedure Laterality Date  . NO PAST SURGERIES     Patient Active Problem List   Diagnosis Date Noted  . Pregnancy 08/04/2018  . Indication for care in labor or delivery 08/03/2018  . Labor and delivery indication for care or intervention 08/02/2018  . Chronic hypertension with superimposed pre-eclampsia 07/30/2018  . Chronic hypertension with superimposed preeclampsia 07/21/2018  . Short interval between pregnancies affecting pregnancy in first trimester, antepartum 03/18/2018  . Advanced maternal age in multigravida, first trimester 03/18/2018  . Fetal demise, greater than 22 weeks, antepartum, single gestation 11/13/2017      Prior to Admission medications   Medication Sig Start Date End Date Taking? Authorizing Provider    albuterol (PROVENTIL HFA;VENTOLIN HFA) 108 (90 Base) MCG/ACT inhaler Inhale 2 puffs into the lungs every 4 (four) hours as needed for wheezing. Patient not taking: Reported on 07/30/2018 12/30/17   Marylene Land, NP  ibuprofen (ADVIL,MOTRIN) 600 MG tablet Take 1 tablet (600 mg total) by mouth every 6 (six) hours. 08/09/18   Schermerhorn, Gwen Her, MD  Prenatal Vit-Fe Fumarate-FA (PRENATAL MULTIVITAMIN) TABS tablet Take 1 tablet by mouth daily at 12 noon.    [provider]    Allergies Amoxicillin and Vicodin [hydrocodone-acetaminophen]    Social History Social History   Tobacco Use  . Smoking status: Never Smoker  . Smokeless tobacco: Never Used  Substance Use Topics  . Alcohol use: Not Currently  . Drug use: No    Review of Systems Patient denies headaches, rhinorrhea, blurry vision, numbness, shortness of breath, chest pain, edema, cough, abdominal pain, nausea, vomiting, diarrhea, dysuria, fevers, rashes or hallucinations unless otherwise stated above in HPI. ____________________________________________   PHYSICAL EXAM:  VITAL SIGNS: Vitals:   11/28/18 0039 11/28/18 0301  BP: 124/63 (!) 94/51  Pulse: 91 96  Resp:  18  Temp:    SpO2: 100% 100%    Constitutional: Alert and oriented.  Eyes: Conjunctivae are normal.  Head: Atraumatic. Nose: No congestion/rhinnorhea. Mouth/Throat: Mucous membranes are moist.   Neck: No stridor. Painless ROM.  Cardiovascular: Normal rate, regular rhythm. Grossly normal heart sounds.  Good peripheral circulation. Respiratory: Normal respiratory effort.  No retractions. Lungs CTAB. Gastrointestinal: Soft and nontender. No distention. No abdominal bruits. No CVA tenderness.  Genitourinary:  Musculoskeletal: No lower extremity tenderness nor edema.  No joint effusions. Neurologic:  Normal speech and language. No gross focal neurologic deficits are appreciated. No facial droop Skin:  Skin is warm, dry and intact. No rash  noted. Psychiatric: Mood and affect are normal. Speech and behavior are normal.  ____________________________________________   LABS (all labs ordered are listed, but only abnormal results are displayed)  No results found for this or any previous visit (from the past 24 hour(s)). ____________________________________________ _____________________________________  FWYOVZCHY   ____________________________________________   PROCEDURES  Procedure(s) performed:  Procedures    Critical Care performed: no ____________________________________________   INITIAL IMPRESSION / ASSESSMENT AND PLAN / ED COURSE  Pertinent labs & imaging results that were available during my care of the patient were reviewed by me and considered in my medical decision making (see chart for details).   DDX: htn, medication effect, dehydration, anemia, electrolyte abn  Cindy Hays is a 38 y.o. who presents to the ED with symptoms as described above.  Patient with mildly low blood pressure likely secondary to medication reaction taking labetalol she is not on chronically.  Neuro exam is nonfocal.  She is well-appearing in no acute distress.  Blood work is reassuring shows no evidence of renal insufficiency.  She does have chronic anemia.  No chest pain or shortness of breath.  After observation in the ER her blood pressure normalized.  She not have any symptomatic orthostasis.  At this point I do believe patient stable and appropriate for outpatient follow-up.      As part of my medical decision making, I reviewed the following data within the [place] notes reviewed and incorporated, Labs reviewed, notes from prior ED visits.  ____________________________________________   FINAL CLINICAL IMPRESSION(S) / ED DIAGNOSES  Final diagnoses:  Hypertension, unspecified type      NEW MEDICATIONS STARTED DURING THIS VISIT:  New Prescriptions   No medications on file      Note:  This document was prepared using Dragon voice recognition software and may include unintentional dictation errors.    Merlyn Lot, MD 11/28/18 [phone]

## 2018-12-31 IMAGING — US US FETAL BPP W/O NONSTRESS
1 series · 14 of 17 positions shown · non-contrast
Comparison: none

CLINICAL DATA: Nonreactive nonstress test. Pre-eclampsia. Current
assigned gestational age of 32 weeks 6 days.

EXAM:
LIMITED OBSTETRIC ULTRASOUND AND BIOPHYSICAL PROFILE

[Series 1: us fetal bpp w/o nonstress · 0.26mm/px · 17 acquisitions, 14 frames shown]
[im 1/17]
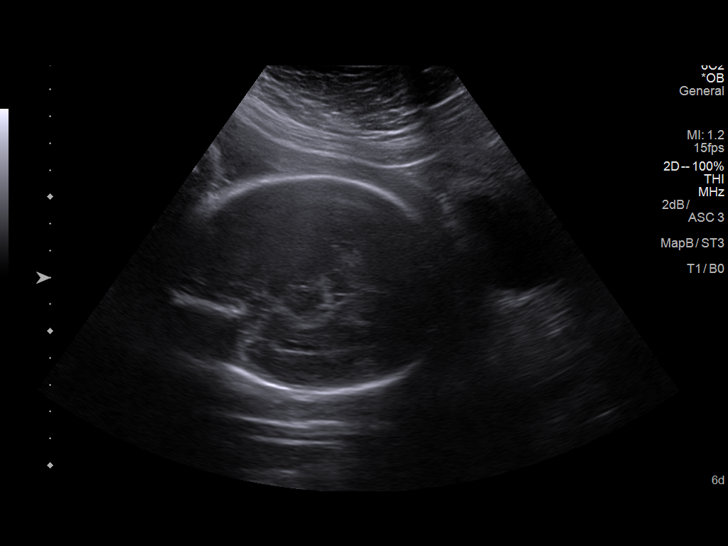
[im 2/17]
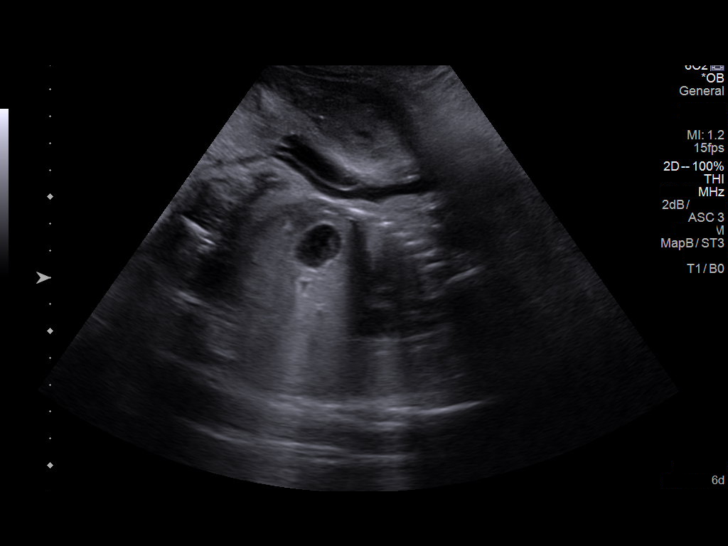
[im 4/17]
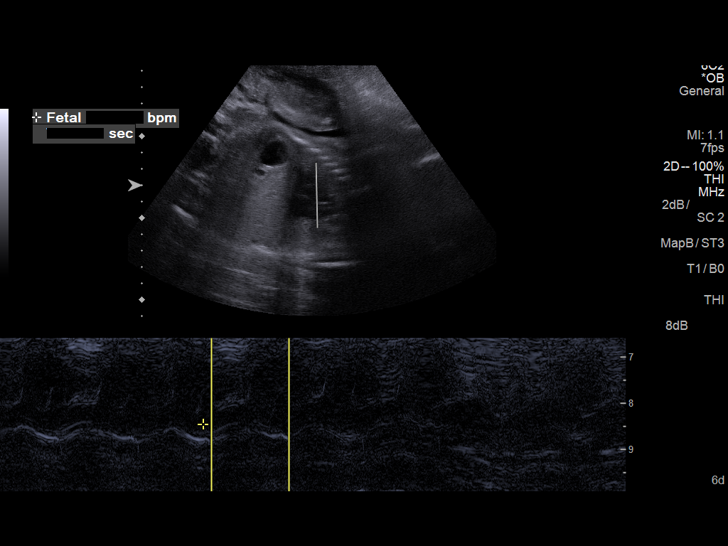
[im 5/17]
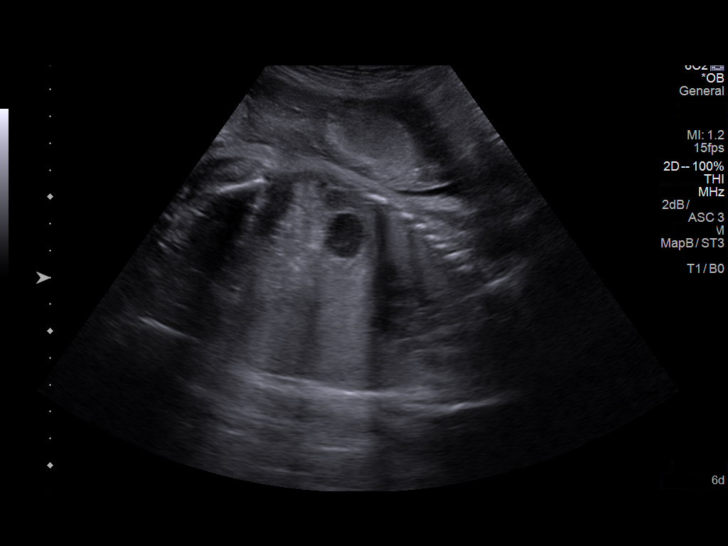
[im 6/17]
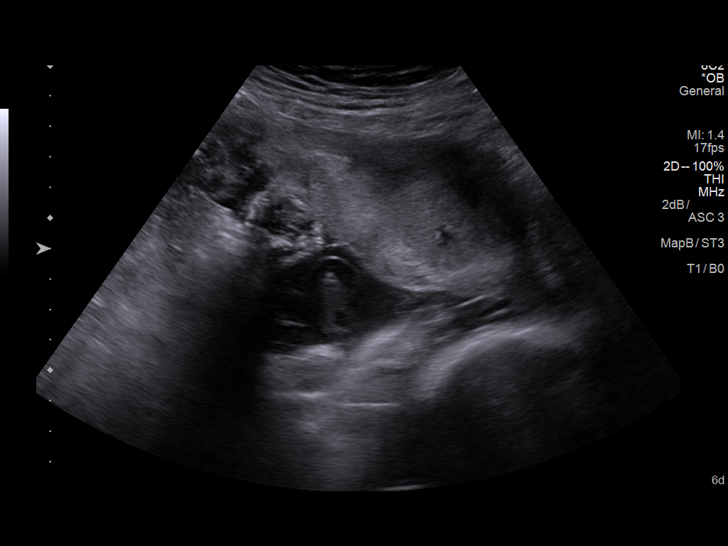
[im 7/17]
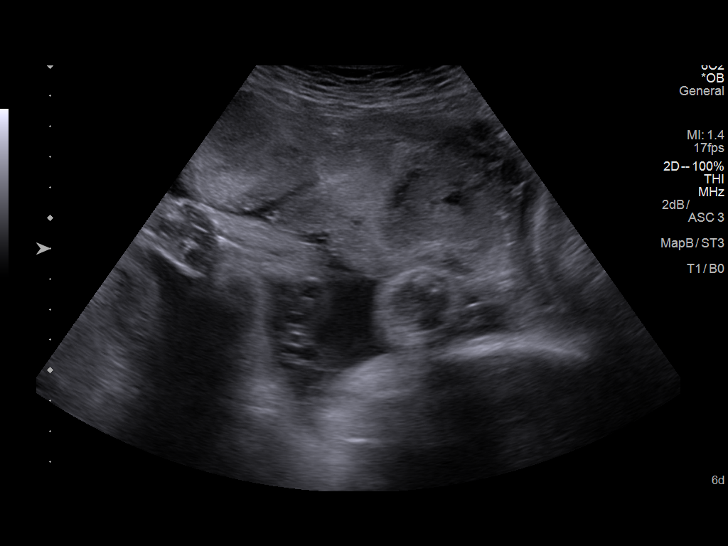
[im 8/17]
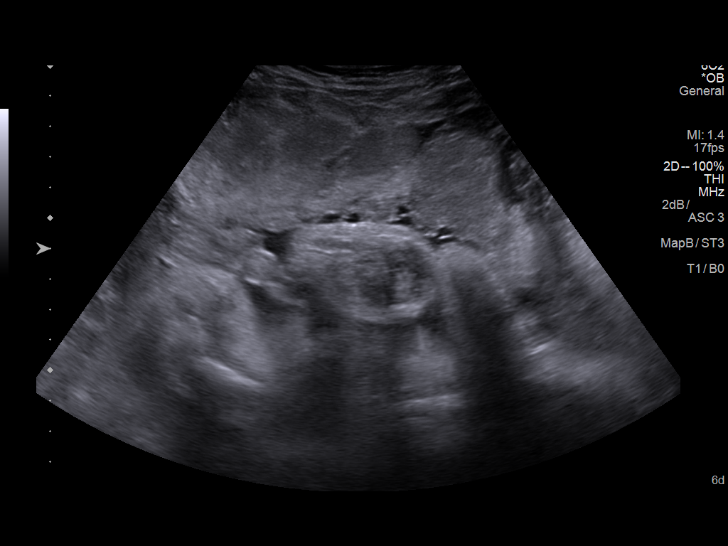
[im 10/17]
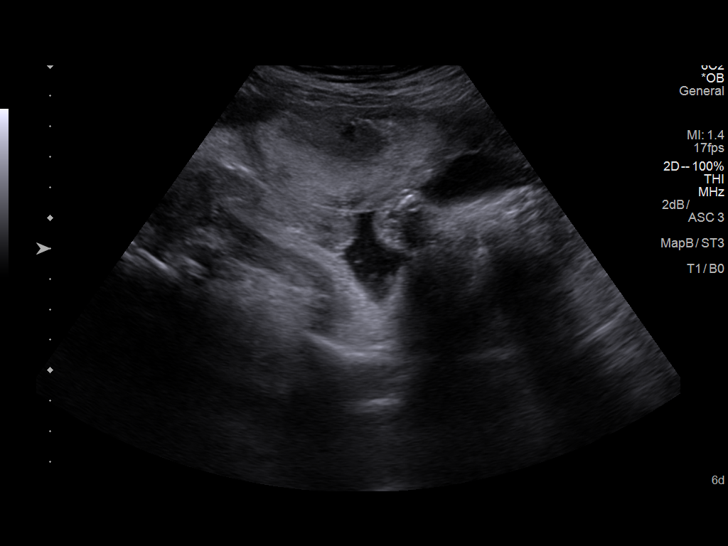
[im 11/17]
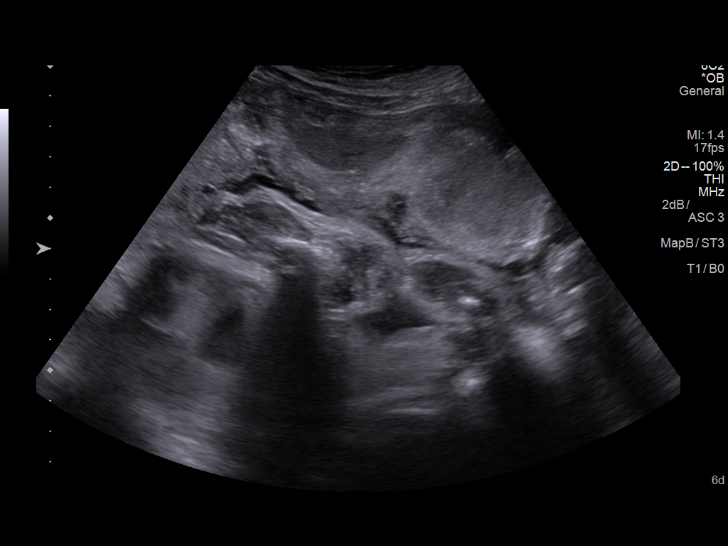
[im 12/17]
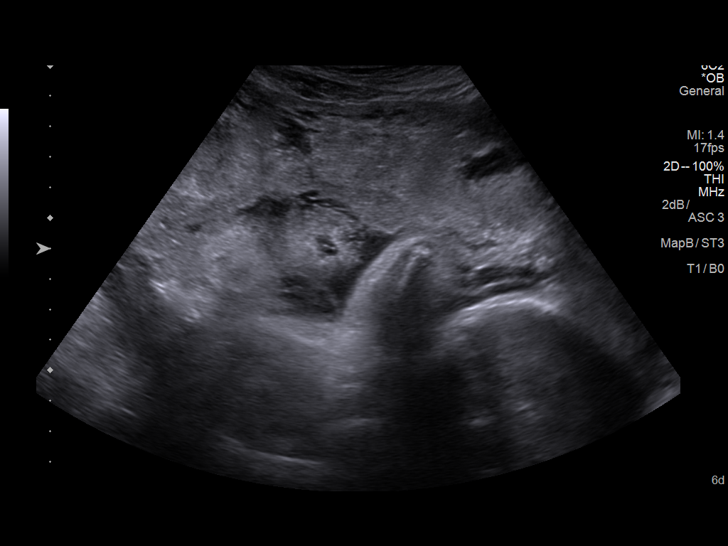
[im 13/17]
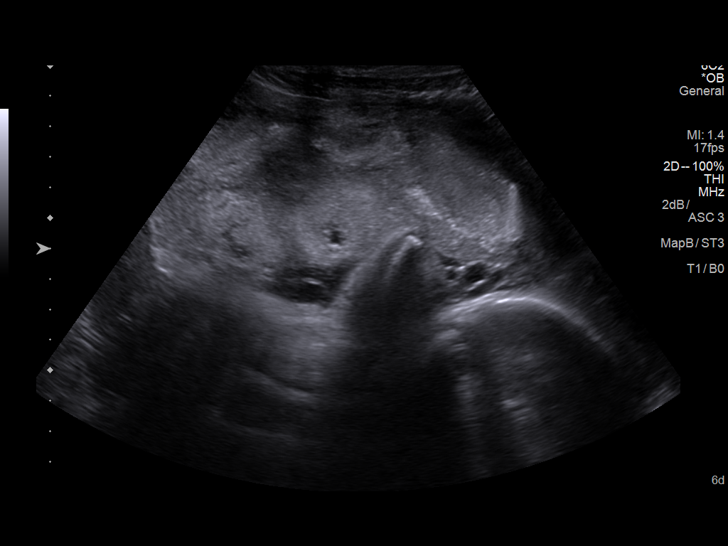
[im 14/17]
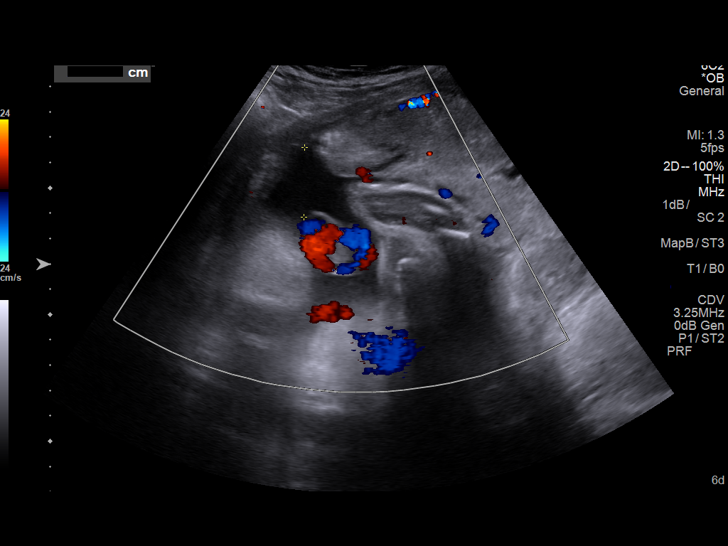
[im 16/17]
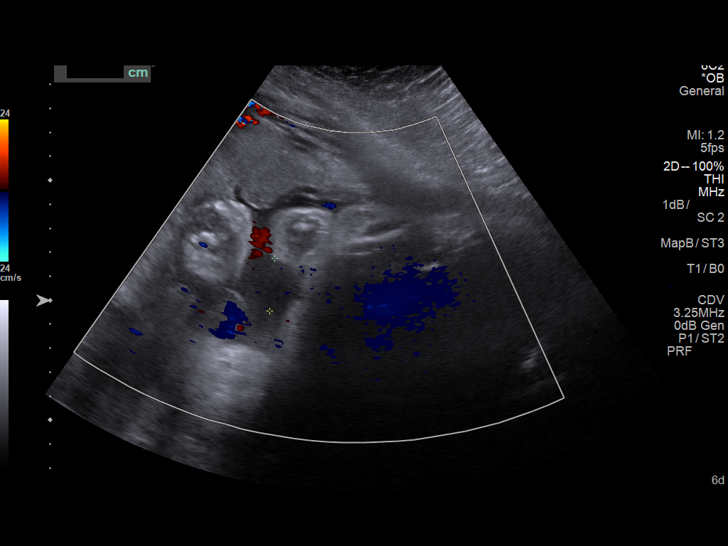
[im 17/17]
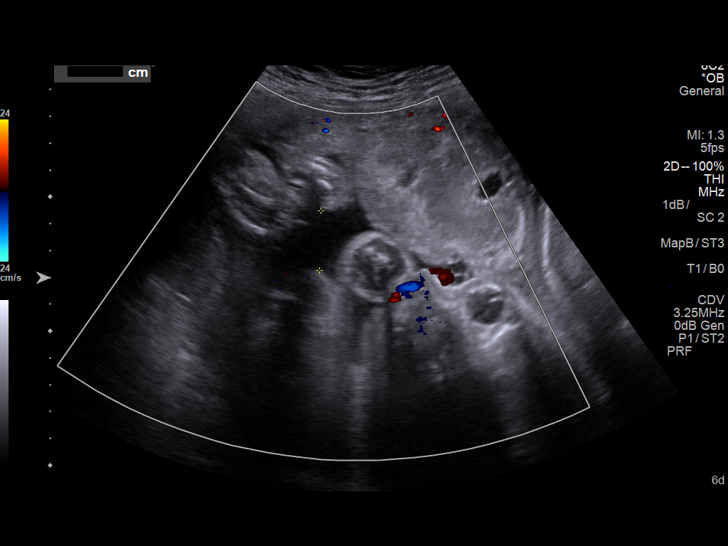

[14 of 17 positions shown; findings below may reference images not displayed]

FINDINGS: Number of Fetuses: 1

Heart Rate:  132 bpm

Movement: Yes

Presentation: Cephalic

Previa: No

Placental Location: Anterior

Amniotic Fluid (Subjective): Subjectively decreased

AFI 8.3 cm (5%ile= 8.3 cm, 95%= 24.5 cm for 33 wks)

Maternal Findings:

Cervix:  Not visualized transabdominally

BIOPHYSICAL PROFILE

Movement: 2 time: 19 minutes

Breathing: 2

Tone:  2

Amniotic Fluid: 2

Total Score:  8
IMPRESSION: Single living intrauterine fetus in cephalic presentation.

Subjectively decreased amniotic fluid volume, with AFI of 8.3 cm
which is currently at the 5th %ile.

Biophysical profile score is [DATE].

## 2019-02-27 ENCOUNTER — Other Ambulatory Visit: Payer: Self-pay

## 2019-02-27 ENCOUNTER — Ambulatory Visit (INDEPENDENT_AMBULATORY_CARE_PROVIDER_SITE_OTHER): Payer: Managed Care, Other (non HMO)

## 2019-02-27 ENCOUNTER — Ambulatory Visit
Admission: EM | Admit: 2019-02-27 | Discharge: 2019-02-27 | Disposition: A | Discharge status: Home / Self Care | Payer: Managed Care, Other (non HMO) | Attending: Family Medicine | Admitting: Family Medicine

## 2019-02-27 DIAGNOSIS — R059 Cough, unspecified: Secondary | ICD-10-CM

## 2019-02-27 DIAGNOSIS — R05 Cough: Secondary | ICD-10-CM

## 2019-02-27 MED ORDER — BENZONATATE 200 MG PO CAPS: 200.0000 mg | ORAL_CAPSULE | Freq: Three times a day (TID) | ORAL | 0 refills | Status: DC | PRN

## 2019-02-27 NOTE — ED Triage Notes (Signed)
Pt here for productive cough for a week and a half with green mucus. Did take mucinex d and cough still hasn't gotten better. Does have a hx of allergies. Has been taking tylenol and benadryl. Complains of SOB with exertion and tightness in her chest where her lungs are.

## 2019-02-27 NOTE — ED Provider Notes (Signed)
[facility]    CSN: [id number] Arrival date & time: 02/27/19  1016     History   Chief Complaint Chief Complaint  Patient presents with  . Cough    HPI Cindy Hays is a 38 y.o. female.   38 yo female with a c/o cough for almost 2 weeks. Denies any fevers, chills, chest pain. States some shortness of breath with exertion. Has a h/o allergies.  The history is provided by the patient.  Cough    Past Medical History:  Diagnosis Date  . Anemia   . Hypertension   . Preterm labor     Patient Active Problem List   Diagnosis Date Noted  . Pregnancy 08/04/2018  . Indication for care in labor or delivery 08/03/2018  . Labor and delivery indication for care or intervention 08/02/2018  . Chronic hypertension with superimposed pre-eclampsia 07/30/2018  . Chronic hypertension with superimposed preeclampsia 07/21/2018  . Short interval between pregnancies affecting pregnancy in first trimester, antepartum 03/18/2018  . Advanced maternal age in multigravida, first trimester 03/18/2018  . Fetal demise, greater than 22 weeks, antepartum, single gestation 11/13/2017    Past Surgical History:  Procedure Laterality Date  . NO PAST SURGERIES      OB History    Gravida  6   Para  6   Term  4   Preterm  1   AB      Living  5     SAB      TAB      Ectopic      Multiple  0   Live Births  1            Home Medications    Prior to Admission medications   Medication Sig Start Date End Date Taking? Authorizing Provider  medroxyPROGESTERone (DEPO-PROVERA) 150 MG/ML injection Inject 150 mg into the muscle every 3 (three) months.   Yes [provider]  albuterol (PROVENTIL HFA;VENTOLIN HFA) 108 (90 Base) MCG/ACT inhaler Inhale 2 puffs into the lungs every 4 (four) hours as needed for wheezing. Patient not taking: Reported on 07/30/2018 12/30/17   Marylene Land, NP  benzonatate (TESSALON) 200 MG capsule Take 1 capsule (200 mg total) by mouth  3 (three) times daily as needed. 02/27/19   Norval Gable, MD  ibuprofen (ADVIL,MOTRIN) 600 MG tablet Take 1 tablet (600 mg total) by mouth every 6 (six) hours. 08/09/18   Schermerhorn, Gwen Her, MD  Prenatal Vit-Fe Fumarate-FA (PRENATAL MULTIVITAMIN) TABS tablet Take 1 tablet by mouth daily at 12 noon.    [provider]    Family History Family History  Problem Relation Age of Onset  . Hypertension Mother   . Hypertension Maternal Grandmother   . Diabetes Maternal Grandmother   . Dementia Maternal Grandmother     Social History Social History   Tobacco Use  . Smoking status: Never Smoker  . Smokeless tobacco: Never Used  Substance Use Topics  . Alcohol use: Not Currently  . Drug use: No     Allergies   Amoxicillin and Vicodin [hydrocodone-acetaminophen]   Review of Systems Review of Systems  Respiratory: Positive for cough.      Physical Exam Triage Vital Signs ED Triage Vitals  Enc Vitals Group     BP 02/27/19 1031 121/89     Pulse Rate 02/27/19 1031 (!) 103     Resp 02/27/19 1031 18     Temp 02/27/19 1031 99.3 F (37.4 C)     Temp  src --      SpO2 02/27/19 1031 100 %     Weight 02/27/19 1034 179 lb (81.2 kg)     Height 02/27/19 1034 5' (1.524 m)     Head Circumference --      Peak Flow --      Pain Score 02/27/19 1034 0     Pain Loc --      Pain Edu? --      Excl. in [place]? --    No data found.  Updated Vital Signs BP 121/89 (BP Location: Right Arm)   Pulse (!) 103   Temp 99.3 F (37.4 C)   Resp 18   Ht 5' (1.524 m)   Wt 81.2 kg   LMP  (LMP Unknown)   SpO2 100%   Breastfeeding No   BMI 34.96 kg/m   Visual Acuity Right Eye Distance:   Left Eye Distance:   Bilateral Distance:    Right Eye Near:   Left Eye Near:    Bilateral Near:     Physical Exam Vitals signs and nursing note reviewed.  Constitutional:      General: She is not in acute distress.    Appearance: She is well-developed. She is not toxic-appearing or diaphoretic.   HENT:     Head: Normocephalic.  Cardiovascular:     Rate and Rhythm: Normal rate.  Pulmonary:     Effort: Pulmonary effort is normal. No respiratory distress.  Neurological:     Mental Status: She is alert.      UC Treatments / Results  Labs (all labs ordered are listed, but only abnormal results are displayed) Labs Reviewed - No data to display  EKG None  Radiology Dg Chest 2 View  Result Date: 02/27/2019 CLINICAL DATA:  Cough and dyspnea EXAM: CHEST - 2 VIEW COMPARISON:  12/08/2015 chest radiograph. FINDINGS: Stable cardiomediastinal silhouette with normal heart size. No pneumothorax. No pleural effusion. Lungs appear clear, with no acute consolidative airspace disease and no pulmonary edema. IMPRESSION: No active cardiopulmonary disease. Electronically Signed   By: Ilona Sorrel M.D.   On: 02/27/2019 11:22    Procedures Procedures (including critical care time)  Medications Ordered in UC Medications - No data to display  Initial Impression / Assessment and Plan / UC Course  I have reviewed the triage vital signs and the nursing notes.  Pertinent labs & imaging results that were available during my care of the patient were reviewed by me and considered in my medical decision making (see chart for details).      Final Clinical Impressions(s) / UC Diagnoses   Final diagnoses:  Cough     Discharge Instructions     Rest, fluids, over the counter medications [place] DHHS guidelines    ED Prescriptions    Medication Sig Dispense Auth. Provider   benzonatate (TESSALON) 200 MG capsule Take 1 capsule (200 mg total) by mouth 3 (three) times daily as needed. 30 capsule Norval Gable, MD      1. x-ray results and diagnosis reviewed with patient 2. rx as per orders above; reviewed possible side effects, interactions, risks and benefits  3. Recommend supportive treatment as above 4. Follow-up prn if symptoms worsen or don't improve Controlled Substance Prescriptions  [place] Controlled Substance Registry consulted? Not Applicable   Norval Gable, MD 02/27/19 1324

## 2019-02-27 NOTE — Discharge Instructions (Signed)
Rest, fluids, over the counter medications [place] DHHS guidelines

## 2019-06-07 ENCOUNTER — Telehealth: Payer: Self-pay | Admitting: Family Medicine

## 2019-06-07 NOTE — Telephone Encounter (Signed)
Patient is concerned about her having irregular periods in the past 3 weeks and she's on depo and is asking if that's regular.

## 2019-06-07 NOTE — Telephone Encounter (Signed)
Pt called back. Answered pt questions about irregular bleeding and depo. Pt states her depo is due in Aug 2020. Pt declined provider appt at this time. Pt states she will try Ibuprofen that provider told her about more than a year ago.

## 2019-07-06 ENCOUNTER — Ambulatory Visit: Payer: Self-pay

## 2019-07-13 ENCOUNTER — Ambulatory Visit: Payer: Self-pay

## 2019-07-27 ENCOUNTER — Other Ambulatory Visit: Payer: Self-pay

## 2019-07-27 ENCOUNTER — Ambulatory Visit (LOCAL_COMMUNITY_HEALTH_CENTER): Payer: Managed Care, Other (non HMO) | Admitting: Family Medicine

## 2019-07-27 VITALS — BP 114/79 | Ht 60.25 in | Wt 192.8 lb

## 2019-07-27 DIAGNOSIS — Z3042 Encounter for surveillance of injectable contraceptive: Secondary | ICD-10-CM

## 2019-07-27 DIAGNOSIS — Z30013 Encounter for initial prescription of injectable contraceptive: Secondary | ICD-10-CM | POA: Diagnosis not present

## 2019-07-27 DIAGNOSIS — Z3009 Encounter for other general counseling and advice on contraception: Secondary | ICD-10-CM

## 2019-07-27 MED ORDER — MEDROXYPROGESTERONE ACETATE 150 MG/ML IM SUSP
150.0000 mg | Freq: Once | INTRAMUSCULAR | Status: AC
  Administered 2019-07-27: 150 mg via INTRAMUSCULAR

## 2019-07-27 NOTE — Progress Notes (Signed)
Here today for Depo. Depo given per 01/27/2019 order. Hal Morales, RN

## 2019-09-10 ENCOUNTER — Encounter: Payer: Self-pay | Admitting: Gynecology

## 2019-09-10 ENCOUNTER — Other Ambulatory Visit: Payer: Self-pay

## 2019-09-10 ENCOUNTER — Ambulatory Visit
Admission: EM | Admit: 2019-09-10 | Discharge: 2019-09-10 | Disposition: A | Discharge status: Home / Self Care | Payer: Managed Care, Other (non HMO) | Attending: Emergency Medicine | Admitting: Emergency Medicine

## 2019-09-10 DIAGNOSIS — R200 Anesthesia of skin: Secondary | ICD-10-CM

## 2019-09-10 DIAGNOSIS — M79641 Pain in right hand: Secondary | ICD-10-CM | POA: Diagnosis present

## 2019-09-10 DIAGNOSIS — E860 Dehydration: Secondary | ICD-10-CM | POA: Diagnosis not present

## 2019-09-10 DIAGNOSIS — R5383 Other fatigue: Secondary | ICD-10-CM | POA: Diagnosis not present

## 2019-09-10 LAB — COMPREHENSIVE METABOLIC PANEL WITH GFR
ALT: 47 U/L — ABNORMAL HIGH (ref 0–44)
AST: 31 U/L (ref 15–41)
Albumin: 4.2 g/dL (ref 3.5–5.0)
Alkaline Phosphatase: 65 U/L (ref 38–126)
Anion gap: 8 (ref 5–15)
BUN: 9 mg/dL (ref 6–20)
CO2: 23 mmol/L (ref 22–32)
Calcium: 9.4 mg/dL (ref 8.9–10.3)
Chloride: 106 mmol/L (ref 98–111)
Creatinine, Ser: 0.7 mg/dL (ref 0.44–1.00)
GFR calc Af Amer: >60 mL/min
GFR calc non Af Amer: >60 mL/min
Glucose, Bld: 86 mg/dL (ref 70–99)
Potassium: 3.8 mmol/L (ref 3.5–5.1)
Sodium: 137 mmol/L (ref 135–145)
Total Bilirubin: 0.5 mg/dL (ref 0.3–1.2)
Total Protein: 8.9 g/dL — ABNORMAL HIGH (ref 6.5–8.1)

## 2019-09-10 LAB — HEMOGLOBIN A1C
Hgb A1c MFr Bld: 5.7 % — ABNORMAL HIGH (ref 4.8–5.6)
Mean Plasma Glucose: 116.89 mg/dL

## 2019-09-10 NOTE — Discharge Instructions (Addendum)
You were treated for possible dehydration and hand pain.   Drink at least 48 oz of water a day.   Treat your hand pain with naproxen (Aleve).   We will call you or send you a message on MyChart about your lab results.

## 2019-09-10 NOTE — ED Triage Notes (Signed)
Patient c/o right arm numbness/ tingling and feeling dizzy x last pm. Per patient tightness at right neck

## 2019-09-10 NOTE — ED Provider Notes (Signed)
[facility] - [place], [place]   Name: Cindy Hays DOB: September 15, 1981 MRN: [id number] CSN: [id number] PCP: Maceo Pro, MD  Arrival date and time:  09/10/19 1204  Chief Complaint:  Numbness   NOTE: Prior to seeing the patient today, I have reviewed the triage nursing documentation and vital signs. Clinical staff has updated patient's PMH/PSHx, current medication list, and drug allergies/intolerances to ensure comprehensive history available to assist in medical decision making.   History:   HPI: Cindy Hays is a 38 y.o. female who presents today with complaints of right hand/arm numbness, muscle cramping and fatigue. She states the numbness travels up the side of her neck. She tried to treat the pain and numbness with ibuprofen, but no change. She has also noticed that her muscles in her lower extremities have been craming throughout the day. 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  She denies pain to her lower extremities.  Associated symptoms include fatigue and lethargy.   Past Medical History:  Diagnosis Date  . Anemia   . Hypertension   . Preterm labor     Past Surgical History:  Procedure Laterality Date  . NO PAST SURGERIES      Family History  Problem Relation Age of Onset  . Hypertension Mother   . Alcohol abuse Mother   . Liver disease Mother   . Hypertension Maternal Grandmother   . Diabetes Maternal Grandmother   . Dementia Maternal Grandmother   . Autoimmune disease Daughter     Social History   Tobacco Use  . Smoking status: Never Smoker  . Smokeless tobacco: Never Used  Substance Use Topics  . Alcohol use: Not Currently  . Drug use: No    Patient Active Problem List   Diagnosis Date Noted  . Pregnancy 08/04/2018  . Indication for care in labor or delivery 08/03/2018  . Labor and delivery indication for care or intervention 08/02/2018  . Chronic hypertension with superimposed pre-eclampsia  07/30/2018  . Chronic hypertension with superimposed preeclampsia 07/21/2018  . Short interval between pregnancies affecting pregnancy in first trimester, antepartum 03/18/2018  . Advanced maternal age in multigravida, first trimester 03/18/2018  . Fetal demise, greater than 22 weeks, antepartum, single gestation 11/13/2017    Home Medications:    Current Meds  Medication Sig  . buPROPion (WELLBUTRIN XL) 150 MG 24 hr tablet Take by mouth.  Marland Kitchen ibuprofen (ADVIL,MOTRIN) 600 MG tablet Take 1 tablet (600 mg total) by mouth every 6 (six) hours.  . Prenatal Vit-Fe Fumarate-FA (PRENATAL MULTIVITAMIN) TABS tablet Take 1 tablet by mouth daily at 12 noon.  . valACYclovir (VALTREX) 500 MG tablet TAKE 1 TABLET BY MOUTH EVERY DAY    Allergies:   Amoxicillin and Vicodin [hydrocodone-acetaminophen]  Review of Systems (ROS): Review of Systems  Constitutional: Positive for fatigue. Negative for chills and fever.  HENT: Negative.   Eyes: Negative.   Respiratory: Negative for cough, shortness of breath and wheezing.   Cardiovascular: Negative for chest pain and leg swelling.  Neurological: Positive for numbness. Negative for tremors, syncope, speech difficulty, weakness, light-headedness and headaches.     Vital Signs: Today's Vitals   09/10/19 1208 09/10/19 1209 09/10/19 1334  BP: 128/84    Pulse: 100    Resp: 18    Temp: 98.9 F (37.2 C)    TempSrc: Oral    SpO2: 100%    Weight:  192 lb (87.1 kg)   Height:  5' (  1.524 m)   PainSc: 8   8     Physical Exam: Physical Exam Vitals signs and nursing note reviewed.  Constitutional:      General: She is not in acute distress.    Appearance: She is well-developed.  HENT:     Head: Normocephalic and atraumatic.  Eyes:     Conjunctiva/sclera: Conjunctivae normal.  Neck:     Musculoskeletal: Neck supple.  Cardiovascular:     Rate and Rhythm: Normal rate and regular rhythm.     Heart sounds: No murmur.  Pulmonary:     Effort: Pulmonary  effort is normal. No respiratory distress.     Breath sounds: Normal breath sounds.  Abdominal:     Palpations: Abdomen is soft.     Tenderness: There is no abdominal tenderness.  Musculoskeletal:     Right wrist: Normal.     Right upper arm: Normal.  Skin:    General: Skin is warm and dry.  Neurological:     General: No focal deficit present.     Mental Status: She is alert and oriented to person, place, and time.     Cranial Nerves: Cranial nerves are intact.     Sensory: Sensation is intact.     Motor: Motor function is intact.     Coordination: Coordination is intact.     Gait: Gait is intact.      Urgent Care Treatments / Results:   LABS: PLEASE NOTE: all labs that were ordered this encounter are listed, however only abnormal results are displayed. Labs Reviewed  COMPREHENSIVE METABOLIC PANEL - Abnormal; Notable for the following components:      Result Value   Total Protein 8.9 (*)    ALT 47 (*)    All other components within normal limits  HEMOGLOBIN A1C    EKG:  Adult ECG Report   Name: Cindy Hays  Age: 38 y.o.  Gender: female   Rate: 83  Rhythm: normal sinus rhythm  PR Interval: 168 ms  QRS Duration: 76 ms  QTc: 420 ms  Conduction Disturbances: none  Other Abnormalities: none   Narrative Interpretation: WNL  RADIOLOGY: No results found.  PROCEDURES: Procedures  MEDICATIONS RECEIVED THIS VISIT: Medications - No data to display  PERTINENT CLINICAL COURSE NOTES/UPDATES:   Initial Impression / Assessment and Plan / Urgent Care Course:  Pertinent labs & imaging results that were available during my care of the patient were personally reviewed by me and considered in my medical decision making (see lab/imaging section of note for values and interpretations).  Cindy Hays is a 38 y.o. female who presents to [facility] today with complaints of right arm numbness and muscle crapming, diagnosed with possible dhydration and hand pain,  and treated as such with the directions below.  NP and patient reviewed discharge instructions below during visit.   Patient is well appearing overall in clinic today. She does not appear to be in any acute distress. Presenting symptoms (see HPI) and exam as documented above.   I have reviewed the follow up and strict return precautions for any new or worsening symptoms. Patient is aware of symptoms that would be deemed urgent/emergent, and would thus require further evaluation either here or in the emergency department. At the time of discharge, she verbalized understanding and consent with the discharge plan as it was reviewed with her. All questions were fielded by provider and/or clinic staff prior to patient discharge.    Final Clinical Impressions / Urgent Care  Diagnoses:   Final diagnoses:  Dehydration  Hand pain, right    New Prescriptions:  [place] Controlled Substance Registry consulted? Not Applicable  No orders of the defined types were placed in this encounter.     Discharge Instructions     You were treated for possible dehydration and hand pain.   Drink at least 48 oz of water a day.   Treat your hand pain with naproxen (Aleve).   We will call you or send you a message on MyChart about your lab results.     Recommended Follow up Care:  Patient encouraged to follow up with the following provider within the specified time frame, or sooner as dictated by the severity of her symptoms. As always, she was instructed that for any urgent/emergent care needs, she should seek care either here or in the emergency department for more immediate evaluation.   Gertie Baron, DNP, NP-c    Gertie Baron, NP 09/10/19 1427

## 2019-09-12 ENCOUNTER — Telehealth (HOSPITAL_COMMUNITY): Payer: Self-pay | Admitting: Emergency Medicine

## 2019-09-12 NOTE — Telephone Encounter (Signed)
Reviewed patients labs with her, all her questions answered. Pt informed of her A1C level, pt will follow up with PCP for further intervention regarding labs.

## 2019-10-12 ENCOUNTER — Ambulatory Visit: Payer: Managed Care, Other (non HMO)

## 2019-10-28 ENCOUNTER — Inpatient Hospital Stay: Payer: Managed Care, Other (non HMO) | Admitting: Hematology and Oncology

## 2019-10-28 DIAGNOSIS — D509 Iron deficiency anemia, unspecified: Secondary | ICD-10-CM | POA: Insufficient documentation

## 2019-11-14 ENCOUNTER — Inpatient Hospital Stay: Payer: Managed Care, Other (non HMO) | Attending: Hematology and Oncology | Admitting: Hematology and Oncology

## 2019-11-18 ENCOUNTER — Encounter: Payer: Self-pay | Admitting: Emergency Medicine

## 2019-11-18 ENCOUNTER — Other Ambulatory Visit: Payer: Self-pay

## 2019-11-18 ENCOUNTER — Ambulatory Visit
Admission: EM | Admit: 2019-11-18 | Discharge: 2019-11-18 | Disposition: A | Discharge status: Home / Self Care | Payer: Managed Care, Other (non HMO) | Attending: Family Medicine | Admitting: Family Medicine

## 2019-11-18 DIAGNOSIS — Z20822 Contact with and (suspected) exposure to covid-19: Secondary | ICD-10-CM | POA: Insufficient documentation

## 2019-11-18 DIAGNOSIS — R05 Cough: Secondary | ICD-10-CM | POA: Diagnosis present

## 2019-11-18 DIAGNOSIS — R112 Nausea with vomiting, unspecified: Secondary | ICD-10-CM | POA: Diagnosis not present

## 2019-11-18 DIAGNOSIS — R059 Cough, unspecified: Secondary | ICD-10-CM

## 2019-11-18 MED ORDER — BENZONATATE 200 MG PO CAPS: 200.0000 mg | ORAL_CAPSULE | Freq: Three times a day (TID) | ORAL | 0 refills | Status: DC | PRN

## 2019-11-18 NOTE — ED Triage Notes (Signed)
Patient in today c/o cough x 2 days. Patient states she had 2 episodes of emesis 11/15/19, but resolved. She had sharp back pain in the middle of her back yesterday. Patient took flu shot on 11/14/19. Patient denies sob, fever.

## 2019-11-18 NOTE — ED Provider Notes (Signed)
[facility]    CSN: [id number] Arrival date & time: 11/18/19  0813      History   Chief Complaint Chief Complaint  Patient presents with  . Cough   HPI  39 year old female presents with cough.  Patient reports a 2-day history of cough.  She states that she recently got a flu vaccine and subsequently developed nausea and vomiting but this has since resolved.  She reports ongoing cough.  No fever.  No chills.  No shortness of breath.  She also reports some recent back pain.  No reported sick contacts other than her son who has been in daycare.  No known contacts with COVID-19.  She is taken some over-the-counter cough medication without resolution.  No known exacerbating factors.  No other associated symptoms.  No other complaints.  PMH, Surgical Hx, Family Hx, Social History reviewed and updated as below.  Past Medical History:  Diagnosis Date  . Anemia   . Hypertension   . Preterm labor     Patient Active Problem List   Diagnosis Date Noted  . Iron deficiency anemia 10/28/2019  . Pregnancy 08/04/2018  . Indication for care in labor or delivery 08/03/2018  . Labor and delivery indication for care or intervention 08/02/2018  . Chronic hypertension with superimposed pre-eclampsia 07/30/2018  . Chronic hypertension with superimposed preeclampsia 07/21/2018  . Short interval between pregnancies affecting pregnancy in first trimester, antepartum 03/18/2018  . Advanced maternal age in multigravida, first trimester 03/18/2018  . Fetal demise, greater than 22 weeks, antepartum, single gestation 11/13/2017    Past Surgical History:  Procedure Laterality Date  . NO PAST SURGERIES      OB History    Gravida  6   Para  6   Term  4   Preterm  1   AB      Living  5     SAB      TAB      Ectopic      Multiple  0   Live Births  1            Home Medications    Prior to Admission medications   Medication Sig Start Date End Date Taking?  Authorizing Provider  albuterol (PROVENTIL HFA;VENTOLIN HFA) 108 (90 Base) MCG/ACT inhaler Inhale 2 puffs into the lungs every 4 (four) hours as needed for wheezing. 12/30/17  Yes Marylene Land, NP  LINZESS 145 MCG CAPS capsule Take 145 mcg by mouth daily. 10/21/19  Yes [provider]  Multiple Vitamin (MULTI-VITAMIN DAILY PO) Take by mouth.   Yes [provider]  norgestimate-ethinyl estradiol (ORTHO-CYCLEN) 0.25-35 MG-MCG tablet TAKE 1 TABLET BY MOUTH 3 TIMES A DAY X 3 DAYS, THEN 1 TABLET 2 TIMES A DAY, THEN ONE TABLET DAILY 10/18/19  Yes [provider]  valACYclovir (VALTREX) 500 MG tablet TAKE 1 TABLET BY MOUTH EVERY DAY 04/26/19  Yes [provider]  benzonatate (TESSALON) 200 MG capsule Take 1 capsule (200 mg total) by mouth 3 (three) times daily as needed for cough. 11/18/19   Coral Spikes, DO  buPROPion (WELLBUTRIN XL) 150 MG 24 hr tablet Take by mouth. 08/17/19 11/18/19  [provider]  medroxyPROGESTERone (DEPO-PROVERA) 150 MG/ML injection Inject 150 mg into the muscle every 3 (three) months.  11/18/19  [provider]  [name] 150-35 MCG/24HR transdermal patch 1 patch once a week. 09/04/19 11/18/19  [provider]    Family History Family History  Problem Relation Age of  Onset  . Hypertension Mother   . Alcohol abuse Mother   . Liver disease Mother   . Hypertension Maternal Grandmother   . Diabetes Maternal Grandmother   . Dementia Maternal Grandmother   . Autoimmune disease Daughter     Social History Social History   Tobacco Use  . Smoking status: Never Smoker  . Smokeless tobacco: Never Used  Substance Use Topics  . Alcohol use: Not Currently  . Drug use: No     Allergies   Vicodin [hydrocodone-acetaminophen]   Review of Systems Review of Systems  Constitutional: Negative for fever.  Respiratory: Positive for cough.   Gastrointestinal: Positive for nausea and vomiting.   Physical Exam Triage Vital  Signs ED Triage Vitals  Enc Vitals Group     BP 11/18/19 0825 (!) 139/93     Pulse Rate 11/18/19 0825 96     Resp 11/18/19 0825 18     Temp 11/18/19 0825 98.7 F (37.1 C)     Temp Source 11/18/19 0825 Oral     SpO2 11/18/19 0825 100 %     Weight 11/18/19 0824 192 lb (87.1 kg)     Height 11/18/19 0824 5' (1.524 m)     Head Circumference --      Peak Flow --      Pain Score 11/18/19 0824 0     Pain Loc --      Pain Edu? --      Excl. in [place]? --    Updated Vital Signs BP (!) 139/93 (BP Location: Left Arm)   Pulse 96   Temp 98.7 F (37.1 C) (Oral)   Resp 18   Ht 5' (1.524 m)   Wt 87.1 kg   LMP 11/04/2019 (Approximate)   SpO2 100%   BMI 37.50 kg/m   Visual Acuity Right Eye Distance:   Left Eye Distance:   Bilateral Distance:    Right Eye Near:   Left Eye Near:    Bilateral Near:     Physical Exam Vitals and nursing note reviewed.  Constitutional:      General: She is not in acute distress.    Appearance: Normal appearance. She is obese. She is not ill-appearing.  HENT:     Head: Normocephalic and atraumatic.  Eyes:     General:        Right eye: No discharge.        Left eye: No discharge.     Conjunctiva/sclera: Conjunctivae normal.  Cardiovascular:     Rate and Rhythm: Normal rate and regular rhythm.     Heart sounds: No murmur.  Pulmonary:     Effort: Pulmonary effort is normal.     Breath sounds: Normal breath sounds. No wheezing, rhonchi or rales.  Neurological:     Mental Status: She is alert.  Psychiatric:        Mood and Affect: Mood normal.        Behavior: Behavior normal.    UC Treatments / Results  Labs (all labs ordered are listed, but only abnormal results are displayed) Labs Reviewed  NOVEL CORONAVIRUS, NAA (HOSP ORDER, SEND-OUT TO REF LAB; TAT 18-24 HRS)    EKG   Radiology No results found.  Procedures Procedures (including critical care time)  Medications Ordered in UC Medications - No data to display  Initial Impression  / Assessment and Plan / UC Course  I have reviewed the triage vital signs and the nursing notes.  Pertinent labs & imaging results that  were available during my care of the patient were reviewed by me and considered in my medical decision making (see chart for details).    39 year old female presents with cough.  Awaiting Covid test results.  Tessalon Perles for cough.  Supportive care.  Final Clinical Impressions(s) / UC Diagnoses   Final diagnoses:  Cough  Encounter for screening laboratory testing for COVID-19 virus     Discharge Instructions     Cough medication as prescribed.  Covid test available in 24 to 48 hours.  Stay home.  Take care  Dr. Lacinda Axon    ED Prescriptions    Medication Sig Dispense Auth. Provider   benzonatate (TESSALON) 200 MG capsule Take 1 capsule (200 mg total) by mouth 3 (three) times daily as needed for cough. 30 capsule Coral Spikes, DO     PDMP not reviewed this encounter.   Coral Spikes, [place] 11/18/19 [phone]

## 2019-11-18 NOTE — Discharge Instructions (Addendum)
Cough medication as prescribed.  Covid test available in 24 to 48 hours.  Stay home.  Take care  Dr. Lacinda Axon

## 2019-11-20 ENCOUNTER — Emergency Department: Payer: Managed Care, Other (non HMO)

## 2019-11-20 ENCOUNTER — Other Ambulatory Visit: Payer: Self-pay

## 2019-11-20 DIAGNOSIS — Z5321 Procedure and treatment not carried out due to patient leaving prior to being seen by health care provider: Secondary | ICD-10-CM | POA: Insufficient documentation

## 2019-11-20 DIAGNOSIS — R002 Palpitations: Secondary | ICD-10-CM | POA: Insufficient documentation

## 2019-11-20 LAB — CBC
HCT: 32.4 % — ABNORMAL LOW (ref 36.0–46.0)
Hemoglobin: 9.8 g/dL — ABNORMAL LOW (ref 12.0–15.0)
MCH: 23.1 pg — ABNORMAL LOW (ref 26.0–34.0)
MCHC: 30.2 g/dL (ref 30.0–36.0)
MCV: 76.4 fL — ABNORMAL LOW (ref 80.0–100.0)
Platelets: 366 10*3/uL (ref 150–400)
RBC: 4.24 MIL/uL (ref 3.87–5.11)
RDW: 17.4 % — ABNORMAL HIGH (ref 11.5–15.5)
WBC: 5.4 10*3/uL (ref 4.0–10.5)
nRBC: 0 % (ref 0.0–0.2)

## 2019-11-20 LAB — BASIC METABOLIC PANEL
Anion gap: 9 (ref 5–15)
BUN: 6 mg/dL (ref 6–20)
CO2: 22 mmol/L (ref 22–32)
Calcium: 9.5 mg/dL (ref 8.9–10.3)
Chloride: 107 mmol/L (ref 98–111)
Creatinine, Ser: 0.59 mg/dL (ref 0.44–1.00)
GFR calc Af Amer: >60 mL/min (ref >60)
GFR calc non Af Amer: >60 mL/min (ref >60)
Glucose, Bld: 86 mg/dL (ref 70–99)
Potassium: 3.7 mmol/L (ref 3.5–5.1)
Sodium: 138 mmol/L (ref 135–145)

## 2019-11-20 LAB — TROPONIN I (HIGH SENSITIVITY): Troponin I (High Sensitivity): <2 ng/L (ref <18)

## 2019-11-20 LAB — NOVEL CORONAVIRUS, NAA (HOSP ORDER, SEND-OUT TO REF LAB; TAT 18-24 HRS): SARS-CoV-2, NAA: NOT DETECTED

## 2019-11-20 NOTE — ED Triage Notes (Signed)
Pt states that she is anemic and has felt palpitations. Appears anxious. A&O, ambulatory. States has felt these feelings before but became more constant today. Has been under more stress lately. Drinks a lot of caffeine.

## 2019-11-20 NOTE — ED Notes (Signed)
Called x 2 for x-ray with no answer.

## 2019-11-20 NOTE — ED Notes (Signed)
Pt called for recollect of troponin with no answer. Dawn,RN made aware.

## 2019-11-21 ENCOUNTER — Emergency Department
Admission: EM | Admit: 2019-11-21 | Discharge: 2019-11-21 | Disposition: A | Discharge status: Home / Self Care | Payer: Managed Care, Other (non HMO) | Attending: Emergency Medicine | Admitting: Emergency Medicine

## 2019-11-21 NOTE — ED Notes (Signed)
Unable to locate patient for update or vital signs.

## 2019-11-23 ENCOUNTER — Telehealth: Payer: Self-pay | Admitting: Emergency Medicine

## 2019-11-23 NOTE — Telephone Encounter (Signed)
Called patient due to lwot to inquire about condition and follow up plans. Says she has spoken with pcp and has appt with cardiologist.

## 2019-12-17 ENCOUNTER — Encounter: Payer: Self-pay | Admitting: Emergency Medicine

## 2019-12-17 ENCOUNTER — Other Ambulatory Visit: Payer: Self-pay

## 2019-12-17 DIAGNOSIS — Z79899 Other long term (current) drug therapy: Secondary | ICD-10-CM | POA: Diagnosis not present

## 2019-12-17 DIAGNOSIS — I10 Essential (primary) hypertension: Secondary | ICD-10-CM | POA: Diagnosis not present

## 2019-12-17 DIAGNOSIS — R42 Dizziness and giddiness: Secondary | ICD-10-CM | POA: Diagnosis not present

## 2019-12-17 DIAGNOSIS — R519 Headache, unspecified: Secondary | ICD-10-CM | POA: Insufficient documentation

## 2019-12-17 LAB — URINALYSIS, COMPLETE (UACMP) WITH MICROSCOPIC
Bacteria, UA: NONE SEEN
Bilirubin Urine: NEGATIVE
Glucose, UA: NEGATIVE mg/dL
Hgb urine dipstick: NEGATIVE
Ketones, ur: NEGATIVE mg/dL
Leukocytes,Ua: NEGATIVE
Nitrite: NEGATIVE
Protein, ur: NEGATIVE mg/dL
Specific Gravity, Urine: 1.008 (ref 1.005–1.030)
pH: 6 (ref 5.0–8.0)

## 2019-12-17 LAB — BASIC METABOLIC PANEL
Anion gap: 9 (ref 5–15)
BUN: 6 mg/dL (ref 6–20)
CO2: 22 mmol/L (ref 22–32)
Calcium: 9.3 mg/dL (ref 8.9–10.3)
Chloride: 105 mmol/L (ref 98–111)
Creatinine, Ser: 0.6 mg/dL (ref 0.44–1.00)
GFR calc Af Amer: >60 mL/min (ref >60)
GFR calc non Af Amer: >60 mL/min (ref >60)
Glucose, Bld: 113 mg/dL — ABNORMAL HIGH (ref 70–99)
Potassium: 3.7 mmol/L (ref 3.5–5.1)
Sodium: 136 mmol/L (ref 135–145)

## 2019-12-17 LAB — CBC
HCT: 32.8 % — ABNORMAL LOW (ref 36.0–46.0)
Hemoglobin: 9.9 g/dL — ABNORMAL LOW (ref 12.0–15.0)
MCH: 23.5 pg — ABNORMAL LOW (ref 26.0–34.0)
MCHC: 30.2 g/dL (ref 30.0–36.0)
MCV: 77.9 fL — ABNORMAL LOW (ref 80.0–100.0)
Platelets: 368 10*3/uL (ref 150–400)
RBC: 4.21 MIL/uL (ref 3.87–5.11)
RDW: 17.3 % — ABNORMAL HIGH (ref 11.5–15.5)
WBC: 6.9 10*3/uL (ref 4.0–10.5)
nRBC: 0 % (ref 0.0–0.2)

## 2019-12-17 LAB — GLUCOSE, CAPILLARY: Glucose-Capillary: 109 mg/dL — ABNORMAL HIGH (ref 70–99)

## 2019-12-17 LAB — POCT PREGNANCY, URINE: Preg Test, Ur: NEGATIVE

## 2019-12-17 MED ORDER — IBUPROFEN 400 MG PO TABS
400.0000 mg | ORAL_TABLET | Freq: Once | ORAL | Status: AC | PRN
  Administered 2019-12-17: 400 mg via ORAL

## 2019-12-17 MED ORDER — ACETAMINOPHEN 325 MG PO TABS
650.0000 mg | ORAL_TABLET | Freq: Once | ORAL | Status: AC
  Administered 2019-12-17: 650 mg via ORAL
  Filled 2019-12-17: qty 2

## 2019-12-17 NOTE — ED Triage Notes (Signed)
First RN Note: Pt presents to ED via [facility] with c/o sudden onset HA that is episodic in nature. Per EMS HA resolved en route and returned on arrival to ED.    148/88 110HR 98% RA

## 2019-12-17 NOTE — ED Triage Notes (Signed)
Pt reports she has been feeling dizzy "feel like I am goign to pass out" since yesterday. Pt reports reports history of anemia has not seen her MD since last September. Pt reports dizziness is accompanied by headache located to left occipital area . Pt talks in complete sentence no respiratory distress noted

## 2019-12-18 ENCOUNTER — Emergency Department: Payer: Managed Care, Other (non HMO)

## 2019-12-18 ENCOUNTER — Emergency Department
Admission: EM | Admit: 2019-12-18 | Discharge: 2019-12-18 | Disposition: A | Discharge status: Home / Self Care | Payer: Managed Care, Other (non HMO) | Attending: Emergency Medicine | Admitting: Emergency Medicine

## 2019-12-18 DIAGNOSIS — R519 Headache, unspecified: Secondary | ICD-10-CM

## 2019-12-18 DIAGNOSIS — R42 Dizziness and giddiness: Secondary | ICD-10-CM

## 2019-12-18 LAB — URINE DRUG SCREEN, QUALITATIVE (ARMC ONLY)
Amphetamines, Ur Screen: NOT DETECTED
Barbiturates, Ur Screen: NOT DETECTED
Benzodiazepine, Ur Scrn: NOT DETECTED
Cannabinoid 50 Ng, Ur ~~LOC~~: NOT DETECTED
Cocaine Metabolite,Ur ~~LOC~~: NOT DETECTED
MDMA (Ecstasy)Ur Screen: NOT DETECTED
Methadone Scn, Ur: NOT DETECTED
Opiate, Ur Screen: NOT DETECTED
Phencyclidine (PCP) Ur S: NOT DETECTED
Tricyclic, Ur Screen: NOT DETECTED

## 2019-12-18 MED ORDER — DIPHENHYDRAMINE HCL 50 MG/ML IJ SOLN
25.0000 mg | Freq: Once | INTRAMUSCULAR | Status: AC
  Administered 2019-12-18: 25 mg via INTRAVENOUS
  Filled 2019-12-18: qty 1

## 2019-12-18 MED ORDER — PROCHLORPERAZINE EDISYLATE 10 MG/2ML IJ SOLN
10.0000 mg | Freq: Once | INTRAMUSCULAR | Status: AC
  Administered 2019-12-18: 10 mg via INTRAVENOUS
  Filled 2019-12-18: qty 2

## 2019-12-18 MED ORDER — LACTATED RINGERS IV BOLUS
1000.0000 mL | Freq: Once | INTRAVENOUS | Status: AC
  Administered 2019-12-18: 1000 mL via INTRAVENOUS

## 2019-12-18 MED ORDER — PROCHLORPERAZINE MALEATE 10 MG PO TABS: 10.0000 mg | ORAL_TABLET | Freq: Four times a day (QID) | ORAL | 0 refills | Status: DC | PRN

## 2019-12-18 NOTE — ED Provider Notes (Signed)
[facility] Emergency Department Provider Note   ____________________________________________   First MD Initiated Contact with Patient 12/18/19 0231     (approximate)  I have reviewed the triage vital signs and the nursing notes.   HISTORY  Chief Complaint Headache    HPI Cindy Hays is a 39 y.o. female with past medical history of hypertension who presents to the ED complaining of headache and dizziness. Patient reports that for about the past 3 days she has been having intermittent episodes of pain affecting the left posterior portion of her head. Pain is not exacerbated or alleviated by anything in particular, is described as a throbbing that can come on at any time. When her head is bothering her, she describes feeling very lightheaded and like she is going to pass out. She has not had any associated chest pain or shortness of breath, and denies any vision changes, speech changes, numbness, or weakness. She denies any history of headaches, has not had any nausea, vomiting, or photophobia. Headache has persisted despite Tylenol and ibuprofen given in triage.        Past Medical History:  Diagnosis Date  . Anemia   . Hypertension   . Preterm labor     Patient Active Problem List   Diagnosis Date Noted  . Iron deficiency anemia 10/28/2019  . Pregnancy 08/04/2018  . Indication for care in labor or delivery 08/03/2018  . Labor and delivery indication for care or intervention 08/02/2018  . Chronic hypertension with superimposed pre-eclampsia 07/30/2018  . Chronic hypertension with superimposed preeclampsia 07/21/2018  . Short interval between pregnancies affecting pregnancy in first trimester, antepartum 03/18/2018  . Advanced maternal age in multigravida, first trimester 03/18/2018  . Fetal demise, greater than 22 weeks, antepartum, single gestation 11/13/2017    Past Surgical History:  Procedure Laterality Date  . NO PAST SURGERIES       Prior to Admission medications   Medication Sig Start Date End Date Taking? Authorizing Provider  albuterol (PROVENTIL HFA;VENTOLIN HFA) 108 (90 Base) MCG/ACT inhaler Inhale 2 puffs into the lungs every 4 (four) hours as needed for wheezing. 12/30/17   Marylene Land, NP  benzonatate (TESSALON) 200 MG capsule Take 1 capsule (200 mg total) by mouth 3 (three) times daily as needed for cough. 11/18/19   Coral Spikes, DO  LINZESS 145 MCG CAPS capsule Take 145 mcg by mouth daily. 10/21/19   [provider]  Multiple Vitamin (MULTI-VITAMIN DAILY PO) Take by mouth.    [provider]  norgestimate-ethinyl estradiol (ORTHO-CYCLEN) 0.25-35 MG-MCG tablet TAKE 1 TABLET BY MOUTH 3 TIMES A DAY X 3 DAYS, THEN 1 TABLET 2 TIMES A DAY, THEN ONE TABLET DAILY 10/18/19   [provider]  prochlorperazine (COMPAZINE) 10 MG tablet Take 1 tablet (10 mg total) by mouth every 6 (six) hours as needed for nausea or vomiting. 12/18/19   Blake Divine, MD  valACYclovir (VALTREX) 500 MG tablet TAKE 1 TABLET BY MOUTH EVERY DAY 04/26/19   [provider]  buPROPion (WELLBUTRIN XL) 150 MG 24 hr tablet Take by mouth. 08/17/19 11/18/19  [provider]  medroxyPROGESTERone (DEPO-PROVERA) 150 MG/ML injection Inject 150 mg into the muscle every 3 (three) months.  11/18/19  [provider]  [name] 150-35 MCG/24HR transdermal patch 1 patch once a week. 09/04/19 11/18/19  [provider]    Allergies Vicodin [hydrocodone-acetaminophen]  Family History  Problem Relation Age of Onset  . Hypertension Mother   . Alcohol abuse Mother   .  Liver disease Mother   . Hypertension Maternal Grandmother   . Diabetes Maternal Grandmother   . Dementia Maternal Grandmother   . Autoimmune disease Daughter     Social History Social History   Tobacco Use  . Smoking status: Never Smoker  . Smokeless tobacco: Never Used  Substance Use Topics  . Alcohol use: Not Currently  . Drug use:  No    Review of Systems  Constitutional: No fever/chills Eyes: No visual changes. ENT: No sore throat. Cardiovascular: Denies chest pain. Positive for dizziness and lightheadedness. Respiratory: Denies shortness of breath. Gastrointestinal: No abdominal pain.  No nausea, no vomiting.  No diarrhea.  No constipation. Genitourinary: Negative for dysuria. Musculoskeletal: Negative for back pain. Skin: Negative for rash. Neurological: Positive for headache, negative for focal weakness or numbness.  ____________________________________________   PHYSICAL EXAM:  VITAL SIGNS: ED Triage Vitals  Enc Vitals Group     BP 12/17/19 1926 135/72     Pulse Rate 12/17/19 1926 (!) 124     Resp 12/17/19 1926 (!) 24     Temp 12/17/19 1926 98.4 F (36.9 C)     Temp Source 12/17/19 1926 Oral     SpO2 12/17/19 1926 100 %     Weight 12/17/19 1927 198 lb (89.8 kg)     Height 12/17/19 1927 5' (1.524 m)     Head Circumference --      Peak Flow --      Pain Score 12/17/19 1926 9     Pain Loc --      Pain Edu? --      Excl. in [place]? --     Constitutional: Alert and oriented. Eyes: Conjunctivae are normal. Pupils equal round and reactive to light bilaterally, extraocular movements intact without nystagmus. Head: Atraumatic. Nose: No congestion/rhinnorhea. Mouth/Throat: Mucous membranes are moist. Neck: Normal ROM Cardiovascular: Normal rate, regular rhythm. Grossly normal heart sounds. Respiratory: Normal respiratory effort.  No retractions. Lungs CTAB. Gastrointestinal: Soft and nontender. No distention. Genitourinary: deferred Musculoskeletal: No lower extremity tenderness nor edema. Neurologic:  Normal speech and language. No gross focal neurologic deficits are appreciated. Skin:  Skin is warm, dry and intact. No rash noted. Psychiatric: Mood and affect are normal. Speech and behavior are normal.  ____________________________________________   LABS (all labs ordered are listed, but only  abnormal results are displayed)  Labs Reviewed  GLUCOSE, CAPILLARY - Abnormal; Notable for the following components:      Result Value   Glucose-Capillary 109 (*)    All other components within normal limits  BASIC METABOLIC PANEL - Abnormal; Notable for the following components:   Glucose, Bld 113 (*)    All other components within normal limits  CBC - Abnormal; Notable for the following components:   Hemoglobin 9.9 (*)    HCT 32.8 (*)    MCV 77.9 (*)    MCH 23.5 (*)    RDW 17.3 (*)    All other components within normal limits  URINALYSIS, COMPLETE (UACMP) WITH MICROSCOPIC - Abnormal; Notable for the following components:   Color, Urine STRAW (*)    APPearance CLEAR (*)    All other components within normal limits  URINE DRUG SCREEN, QUALITATIVE ([facility] ONLY)  CBG MONITORING, ED  POC URINE PREG, ED  POCT PREGNANCY, URINE   ____________________________________________  EKG  ED ECG REPORT I, Blake Divine, the attending physician, personally viewed and interpreted this ECG.   Date: 12/18/2019  EKG Time: 19:37  Rate: 103  Rhythm: sinus tachycardia  Axis: Normal  Intervals:none  ST&T Change: None   PROCEDURES  Procedure(s) performed (including Critical Care):  Procedures   ____________________________________________   INITIAL IMPRESSION / ASSESSMENT AND PLAN / ED COURSE       39 year old female presents to the ED with complaints of intermittent headaches over the past 3 days associated with dizziness and lightheadedness.  She is not feeling lightheaded currently but complains of significant ongoing headache to her left posterior scalp.  CT head obtained and negative for acute process, does show some atherosclerosis but I do not suspect stroke given lack of focal neurologic findings and describe dizziness does not appear consistent with posterior stroke.  Lab work is unremarkable, pregnancy testing negative.  EKG shows no evidence of arrhythmia or ischemia.  Will  hydrate with IV fluids and give migraine cocktail.  Patient reports headache has resolved, but she continues to feel lightheaded.  Vital signs remained stable, doubt cardiac etiology of her lightheadedness.  She is appropriate for discharge with PCP follow-up, I have counseled her to return to the ED for new or worsening symptoms.  Patient agrees with plan.      ____________________________________________   FINAL CLINICAL IMPRESSION(S) / ED DIAGNOSES  Final diagnoses:  Acute nonintractable headache, unspecified headache type  Lightheadedness     ED Discharge Orders         Ordered    prochlorperazine (COMPAZINE) 10 MG tablet  Every 6 hours PRN     12/18/19 0403           Note:  This document was prepared using Dragon voice recognition software and may include unintentional dictation errors.   Blake Divine, MD 12/18/19 [phone]

## 2020-01-14 ENCOUNTER — Other Ambulatory Visit: Payer: Self-pay

## 2020-01-14 ENCOUNTER — Ambulatory Visit
Admission: EM | Admit: 2020-01-14 | Discharge: 2020-01-14 | Disposition: A | Discharge status: Home / Self Care | Payer: 59 | Attending: Family Medicine | Admitting: Family Medicine

## 2020-01-14 DIAGNOSIS — M5481 Occipital neuralgia: Secondary | ICD-10-CM

## 2020-01-14 MED ORDER — KETOROLAC TROMETHAMINE 60 MG/2ML IM SOLN
60.0000 mg | Freq: Once | INTRAMUSCULAR | Status: AC
  Administered 2020-01-14: 60 mg via INTRAMUSCULAR

## 2020-01-14 MED ORDER — ONDANSETRON 4 MG PO TBDP
4.0000 mg | ORAL_TABLET | Freq: Once | ORAL | Status: AC
  Administered 2020-01-14: 4 mg via ORAL

## 2020-01-14 MED ORDER — DEXAMETHASONE SODIUM PHOSPHATE 10 MG/ML IJ SOLN
10.0000 mg | Freq: Once | INTRAMUSCULAR | Status: AC
  Administered 2020-01-14: 10 mg via INTRAMUSCULAR

## 2020-01-14 MED ORDER — BUTALBITAL-APAP-CAFFEINE 50-325-40 MG PO TABS: 1.0000 | ORAL_TABLET | Freq: Four times a day (QID) | ORAL | 0 refills | Status: DC | PRN

## 2020-01-14 NOTE — ED Provider Notes (Signed)
[facility]    CSN: [id number] Arrival date & time: 01/14/20  1451      History   Chief Complaint Chief Complaint  Patient presents with  . Headache    HPI Cindy Hays is a 39 y.o. female.   HPI  39 year old female presents with migraine suspected headaches.  Scheduled to see neurology in March.  She states her PCP placed her on nortriptyline but this does not seem to be helping but she is only been taking it for approximately 2 weeks.  She states that her headache started last night and has worsened over this morning.  She states that it feels like her pain is mostly in the occipital area.  She had [facility] at one point time to [facility] where they had injected her right occipital nerve which completely resolved her headaches at the time.  Is also gone to [facility] in the past with headaches with and was given a migraine cocktail but this only relieved her symptoms temporarily.  She denies any significant visual disturbances or visual field cuts.  She has had no slurred speech or drooping of her face.  She has had no loss of her arms or legs.  She has had some numbness in her right hand but this was diagnosed as a carpal tunnel in the past.        Past Medical History:  Diagnosis Date  . Anemia   . Hypertension   . Preterm labor     Patient Active Problem List   Diagnosis Date Noted  . Iron deficiency anemia 10/28/2019  . Pregnancy 08/04/2018  . Indication for care in labor or delivery 08/03/2018  . Labor and delivery indication for care or intervention 08/02/2018  . Chronic hypertension with superimposed pre-eclampsia 07/30/2018  . Chronic hypertension with superimposed preeclampsia 07/21/2018  . Short interval between pregnancies affecting pregnancy in first trimester, antepartum 03/18/2018  . Advanced maternal age in multigravida, first trimester 03/18/2018  . Fetal demise, greater than 22 weeks, antepartum, single gestation 11/13/2017    Past Surgical  History:  Procedure Laterality Date  . NO PAST SURGERIES      OB History    Gravida  6   Para  6   Term  4   Preterm  1   AB      Living  5     SAB      TAB      Ectopic      Multiple  0   Live Births  1            Home Medications    Prior to Admission medications   Medication Sig Start Date End Date Taking? Authorizing Provider  baclofen (LIORESAL) 10 MG tablet Take 10 mg by mouth at bedtime. 12/30/19  Yes [provider]  ferrous sulfate 220 (44 Fe) MG/5ML solution SMARTSIG:6.82 Milliliter(s) By Mouth Twice Daily 11/16/19  Yes [provider]  LINZESS 145 MCG CAPS capsule Take 145 mcg by mouth daily. 10/21/19  Yes [provider]  Multiple Vitamin (MULTI-VITAMIN DAILY PO) Take by mouth.   Yes [provider]  norgestimate-ethinyl estradiol (ORTHO-CYCLEN) 0.25-35 MG-MCG tablet TAKE 1 TABLET BY MOUTH 3 TIMES A DAY X 3 DAYS, THEN 1 TABLET 2 TIMES A DAY, THEN ONE TABLET DAILY 10/18/19  Yes [provider]  nortriptyline (PAMELOR) 10 MG capsule TAKE 1 CAPSULE AT BEDTIME FOR 1 WEEK THEN INCREASE TO 2 CAPS AT BEDTIME 12/30/19  Yes [provider]  prochlorperazine (COMPAZINE) 10 MG tablet Take 1 tablet (10 mg total) by mouth every 6 (six) hours as needed for nausea or vomiting. 12/18/19  Yes Blake Divine, MD  valACYclovir (VALTREX) 500 MG tablet TAKE 1 TABLET BY MOUTH EVERY DAY 04/26/19  Yes [provider]  butalbital-acetaminophen-caffeine (FIORICET) 50-325-40 MG tablet Take 1 tablet by mouth every 6 (six) hours as needed for headache. Not take more than 6 tablets in  24-hours 01/14/20 01/13/21  Lorin Picket, PA-C  albuterol (PROVENTIL HFA;VENTOLIN HFA) 108 (90 Base) MCG/ACT inhaler Inhale 2 puffs into the lungs every 4 (four) hours as needed for wheezing. 12/30/17 01/14/20  Marylene Land, NP  buPROPion (WELLBUTRIN XL) 150 MG 24 hr tablet Take by mouth. 08/17/19 11/18/19  [provider]   medroxyPROGESTERone (DEPO-PROVERA) 150 MG/ML injection Inject 150 mg into the muscle every 3 (three) months.  11/18/19  [provider]  [name] 150-35 MCG/24HR transdermal patch 1 patch once a week. 09/04/19 11/18/19  [provider]    Family History Family History  Problem Relation Age of Onset  . Hypertension Mother   . Alcohol abuse Mother   . Liver disease Mother   . Hypertension Maternal Grandmother   . Diabetes Maternal Grandmother   . Dementia Maternal Grandmother   . Autoimmune disease Daughter     Social History Social History   Tobacco Use  . Smoking status: Never Smoker  . Smokeless tobacco: Never Used  Substance Use Topics  . Alcohol use: Not Currently  . Drug use: No     Allergies   Vicodin [hydrocodone-acetaminophen]   Review of Systems Review of Systems  Constitutional: Positive for activity change. Negative for appetite change, chills, diaphoresis, fatigue and fever.  Neurological: Positive for headaches. Negative for dizziness, tremors, seizures, syncope, facial asymmetry, speech difficulty, weakness and numbness.  All other systems reviewed and are negative.    Physical Exam Triage Vital Signs ED Triage Vitals  Enc Vitals Group     BP 01/14/20 1511 127/87     Pulse Rate 01/14/20 1511 90     Resp 01/14/20 1511 15     Temp 01/14/20 1511 98.5 F (36.9 C)     Temp Source 01/14/20 1511 Oral     SpO2 01/14/20 1511 100 %     Weight 01/14/20 1509 198 lb (89.8 kg)     Height 01/14/20 1509 5\' 1"  (1.549 m)     Head Circumference --      Peak Flow --      Pain Score 01/14/20 1625 5     Pain Loc --      Pain Edu? --      Excl. in [place]? --    No data found.  Updated Vital Signs BP 127/87 (BP Location: Left Arm)   Pulse 90   Temp 98.5 F (36.9 C) (Oral)   Resp 15   Ht 5\' 1"  (1.549 m)   Wt 198 lb (89.8 kg)   LMP 12/19/2019   SpO2 100%   Breastfeeding No   BMI 37.41 kg/m   Visual Acuity Right Eye Distance:   Left Eye  Distance:   Bilateral Distance:    Right Eye Near:   Left Eye Near:    Bilateral Near:     Physical Exam Vitals and nursing note reviewed.  Constitutional:      General: She is not in acute distress.    Appearance: She is well-developed. She is obese. She is not ill-appearing or toxic-appearing.  HENT:  Head: Normocephalic and atraumatic.     Mouth/Throat:     Mouth: Mucous membranes are moist.     Pharynx: Oropharynx is clear.  Eyes:     General: No visual field deficit.    Extraocular Movements: Extraocular movements intact.     Right eye: No nystagmus.     Left eye: No nystagmus.     Pupils: Pupils are equal, round, and reactive to light.  Cardiovascular:     Rate and Rhythm: Normal rate and regular rhythm.     Heart sounds: Normal heart sounds.  Pulmonary:     Breath sounds: Normal breath sounds.  Musculoskeletal:        General: Normal range of motion.     Cervical back: Normal range of motion and neck supple. No rigidity.  Skin:    General: Skin is warm and dry.  Neurological:     Mental Status: She is alert and oriented to person, place, and time.     GCS: GCS eye subscore is 4. GCS verbal subscore is 5. GCS motor subscore is 6.     Cranial Nerves: No cranial nerve deficit, dysarthria or facial asymmetry.     Sensory: No sensory deficit.     Motor: No weakness.     Coordination: Coordination normal.     Gait: Gait normal.  Psychiatric:        Mood and Affect: Mood normal.        Speech: Speech normal.        Behavior: Behavior normal.      UC Treatments / Results  Labs (all labs ordered are listed, but only abnormal results are displayed) Labs Reviewed - No data to display  EKG   Radiology No results found.  Procedures Procedures (including critical care time)  Medications Ordered in UC Medications  ketorolac (TORADOL) injection 60 mg (60 mg Intramuscular Given 01/14/20 1601)  dexamethasone (DECADRON) injection 10 mg (10 mg Intramuscular  Given 01/14/20 1600)  ondansetron (ZOFRAN-ODT) disintegrating tablet 4 mg (4 mg Oral Given 01/14/20 1559)   Patient reports that when she presented to our office her pain level was at 10 has decreased now to a 7 after the above medications. Initial Impression / Assessment and Plan / UC Course  I have reviewed the triage vital signs and the nursing notes.  Pertinent labs & imaging results that were available during my care of the patient were reviewed by me and considered in my medical decision making (see chart for details).   39 year old female presents with a history of possible migraines.  She has an appointment with her neurologist in March.  He has been placed on amitriptyline by her neurologist and she is currently taking 20 mg twice daily.  I have reviewed available medical records in relation to the headache.  She was seen on 12/23/2019 at [facility] for the occipital nerve was injected bilaterally with her pain improved after the injection.  This is probable for her occipital neuralgia.  Tenderness seen 12/30/2019 at the [facility].  It is referred that she is can be seeing neurology by her OB/GYN.  CT of her head did not show any significant abnormalities.  Her examination today shows neurological to be intact.  She has mild tenderness over the occipital nerve on the right.  She was given a migraine cocktail today consisting of Toradol 60 mg intramuscularly Decadron 10 mg intramuscularly and Zofran 4 mg ODT for nausea.  She received only moderate relief from 10  to a 7.  I will prescribe Fioricet for home use until she is seen by the neurologist.  If she worsens I recommend that she go to the emergency room. Final Clinical Impressions(s) / UC Diagnoses   Final diagnoses:  Occipital neuralgia of right side   Discharge Instructions   None    ED Prescriptions    Medication Sig Dispense Auth. Provider   butalbital-acetaminophen-caffeine (FIORICET) 50-325-40 MG tablet Take 1 tablet by  mouth every 6 (six) hours as needed for headache. Not take more than 6 tablets in  24-hours 20 tablet Lorin Picket, PA-C     PDMP not reviewed this encounter.   Lorin Picket, PA-C 01/14/20 1642

## 2020-01-14 NOTE — ED Triage Notes (Signed)
Patient states that she has migraines and is unable to see neurology til March. Patient states that PCP placed her on Nortriptyline but does not seem to be helping. Patient states that her headache started last night and has remained constant. Patient states that the last time she went to [facility] they gave her injections in the back of her head.

## 2020-01-31 DIAGNOSIS — G43839 Menstrual migraine, intractable, without status migrainosus: Secondary | ICD-10-CM | POA: Insufficient documentation

## 2020-02-02 ENCOUNTER — Other Ambulatory Visit: Payer: Self-pay | Admitting: Neurology

## 2020-02-02 DIAGNOSIS — G932 Benign intracranial hypertension: Secondary | ICD-10-CM

## 2020-02-06 ENCOUNTER — Encounter: Payer: Self-pay | Admitting: Emergency Medicine

## 2020-02-06 ENCOUNTER — Emergency Department: Payer: 59

## 2020-02-06 ENCOUNTER — Observation Stay
Admission: EM | Admit: 2020-02-06 | Discharge: 2020-02-08 | Disposition: A | Discharge status: Home / Self Care | Payer: 59 | Attending: Internal Medicine | Admitting: Internal Medicine

## 2020-02-06 ENCOUNTER — Other Ambulatory Visit: Payer: Self-pay

## 2020-02-06 DIAGNOSIS — Z20822 Contact with and (suspected) exposure to covid-19: Secondary | ICD-10-CM | POA: Diagnosis not present

## 2020-02-06 DIAGNOSIS — R519 Headache, unspecified: Secondary | ICD-10-CM

## 2020-02-06 DIAGNOSIS — R000 Tachycardia, unspecified: Secondary | ICD-10-CM | POA: Diagnosis present

## 2020-02-06 DIAGNOSIS — M5481 Occipital neuralgia: Secondary | ICD-10-CM

## 2020-02-06 DIAGNOSIS — I472 Ventricular tachycardia: Secondary | ICD-10-CM

## 2020-02-06 DIAGNOSIS — Z79899 Other long term (current) drug therapy: Secondary | ICD-10-CM | POA: Diagnosis not present

## 2020-02-06 DIAGNOSIS — I10 Essential (primary) hypertension: Secondary | ICD-10-CM | POA: Diagnosis not present

## 2020-02-06 DIAGNOSIS — R55 Syncope and collapse: Principal | ICD-10-CM | POA: Diagnosis present

## 2020-02-06 DIAGNOSIS — I4729 Other ventricular tachycardia: Secondary | ICD-10-CM

## 2020-02-06 DIAGNOSIS — Z793 Long term (current) use of hormonal contraceptives: Secondary | ICD-10-CM | POA: Insufficient documentation

## 2020-02-06 DIAGNOSIS — R768 Other specified abnormal immunological findings in serum: Secondary | ICD-10-CM | POA: Diagnosis not present

## 2020-02-06 DIAGNOSIS — G8929 Other chronic pain: Secondary | ICD-10-CM

## 2020-02-06 LAB — CBC WITH DIFFERENTIAL/PLATELET
Abs Immature Granulocytes: 0.04 10*3/uL (ref 0.00–0.07)
Basophils Absolute: 0 10*3/uL (ref 0.0–0.1)
Basophils Relative: 0 %
Eosinophils Absolute: 0.1 10*3/uL (ref 0.0–0.5)
Eosinophils Relative: 1 %
HCT: 35.2 % — ABNORMAL LOW (ref 36.0–46.0)
Hemoglobin: 10.4 g/dL — ABNORMAL LOW (ref 12.0–15.0)
Immature Granulocytes: 0 %
Lymphocytes Relative: 28 %
Lymphs Abs: 2.8 10*3/uL (ref 0.7–4.0)
MCH: 24.2 pg — ABNORMAL LOW (ref 26.0–34.0)
MCHC: 29.5 g/dL — ABNORMAL LOW (ref 30.0–36.0)
MCV: 82.1 fL (ref 80.0–100.0)
Monocytes Absolute: 1.1 10*3/uL — ABNORMAL HIGH (ref 0.1–1.0)
Monocytes Relative: 11 %
Neutro Abs: 6 10*3/uL (ref 1.7–7.7)
Neutrophils Relative %: 60 %
Platelets: 396 10*3/uL (ref 150–400)
RBC: 4.29 MIL/uL (ref 3.87–5.11)
RDW: 17.2 % — ABNORMAL HIGH (ref 11.5–15.5)
WBC: 10 10*3/uL (ref 4.0–10.5)
nRBC: 0 % (ref 0.0–0.2)

## 2020-02-06 LAB — BASIC METABOLIC PANEL
Anion gap: 12 (ref 5–15)
BUN: 9 mg/dL (ref 6–20)
CO2: 19 mmol/L — ABNORMAL LOW (ref 22–32)
Calcium: 8.7 mg/dL — ABNORMAL LOW (ref 8.9–10.3)
Chloride: 104 mmol/L (ref 98–111)
Creatinine, Ser: 0.86 mg/dL (ref 0.44–1.00)
GFR calc Af Amer: >60 mL/min (ref >60)
GFR calc non Af Amer: >60 mL/min (ref >60)
Glucose, Bld: 107 mg/dL — ABNORMAL HIGH (ref 70–99)
Potassium: 4.8 mmol/L (ref 3.5–5.1)
Sodium: 135 mmol/L (ref 135–145)

## 2020-02-06 LAB — POCT PREGNANCY, URINE: Preg Test, Ur: NEGATIVE

## 2020-02-06 LAB — URINE DRUG SCREEN, QUALITATIVE (ARMC ONLY)
Amphetamines, Ur Screen: NOT DETECTED
Barbiturates, Ur Screen: POSITIVE — AB
Benzodiazepine, Ur Scrn: NOT DETECTED
Cannabinoid 50 Ng, Ur ~~LOC~~: POSITIVE — AB
Cocaine Metabolite,Ur ~~LOC~~: NOT DETECTED
MDMA (Ecstasy)Ur Screen: NOT DETECTED
Methadone Scn, Ur: NOT DETECTED
Opiate, Ur Screen: NOT DETECTED
Phencyclidine (PCP) Ur S: NOT DETECTED
Tricyclic, Ur Screen: POSITIVE — AB

## 2020-02-06 LAB — MAGNESIUM: Magnesium: 2 mg/dL (ref 1.7–2.4)

## 2020-02-06 LAB — FIBRIN DERIVATIVES D-DIMER (ARMC ONLY): Fibrin derivatives D-dimer (ARMC): 542.52 ng/mL (FEU) — ABNORMAL HIGH (ref 0.00–499.00)

## 2020-02-06 LAB — TSH: TSH: 1.378 u[IU]/mL (ref 0.350–4.500)

## 2020-02-06 LAB — TROPONIN I (HIGH SENSITIVITY)
Troponin I (High Sensitivity): <2 ng/L (ref <18)
Troponin I (High Sensitivity): <2 ng/L (ref <18)

## 2020-02-06 MED ORDER — SENNOSIDES-DOCUSATE SODIUM 8.6-50 MG PO TABS: 1.0000 | ORAL_TABLET | Freq: Every evening | ORAL | Status: DC | PRN

## 2020-02-06 MED ORDER — ENOXAPARIN SODIUM 40 MG/0.4ML ~~LOC~~ SOLN
40.0000 mg | SUBCUTANEOUS | Status: DC
  Administered 2020-02-06: 40 mg via SUBCUTANEOUS
  Filled 2020-02-06: qty 0.4

## 2020-02-06 MED ORDER — SODIUM CHLORIDE 0.9% FLUSH
3.0000 mL | Freq: Two times a day (BID) | INTRAVENOUS | Status: DC
  Administered 2020-02-06 – 2020-02-08 (×3): 3 mL via INTRAVENOUS

## 2020-02-06 MED ORDER — LORAZEPAM 2 MG/ML IJ SOLN
1.0000 mg | Freq: Once | INTRAMUSCULAR | Status: AC
  Administered 2020-02-06: 1 mg via INTRAVENOUS
  Filled 2020-02-06: qty 1

## 2020-02-06 MED ORDER — TRAZODONE HCL 50 MG PO TABS
50.0000 mg | ORAL_TABLET | Freq: Every day | ORAL | Status: DC
  Filled 2020-02-06: qty 1

## 2020-02-06 MED ORDER — ONDANSETRON HCL 4 MG/2ML IJ SOLN: 4.0000 mg | Freq: Four times a day (QID) | INTRAMUSCULAR | Status: DC | PRN

## 2020-02-06 MED ORDER — SODIUM CHLORIDE 0.9 % IV BOLUS
1000.0000 mL | Freq: Once | INTRAVENOUS | Status: AC
  Administered 2020-02-06: 1000 mL via INTRAVENOUS

## 2020-02-06 MED ORDER — ZONISAMIDE 100 MG PO CAPS
100.0000 mg | ORAL_CAPSULE | Freq: Every day | ORAL | Status: DC
  Administered 2020-02-06 – 2020-02-08 (×2): 100 mg via ORAL
  Filled 2020-02-06 (×5): qty 1

## 2020-02-06 MED ORDER — ACETAMINOPHEN 325 MG PO TABS
650.0000 mg | ORAL_TABLET | Freq: Four times a day (QID) | ORAL | Status: DC | PRN
  Administered 2020-02-07 – 2020-02-08 (×3): 650 mg via ORAL
  Filled 2020-02-06 (×3): qty 2

## 2020-02-06 MED ORDER — SODIUM CHLORIDE 0.9 % IV SOLN: INTRAVENOUS | Status: DC

## 2020-02-06 MED ORDER — ONDANSETRON HCL 4 MG PO TABS: 4.0000 mg | ORAL_TABLET | Freq: Four times a day (QID) | ORAL | Status: DC | PRN

## 2020-02-06 MED ORDER — PROCHLORPERAZINE EDISYLATE 10 MG/2ML IJ SOLN
10.0000 mg | Freq: Once | INTRAMUSCULAR | Status: AC
  Administered 2020-02-06: 10 mg via INTRAVENOUS
  Filled 2020-02-06: qty 2

## 2020-02-06 MED ORDER — ACETAMINOPHEN 650 MG RE SUPP: 650.0000 mg | Freq: Four times a day (QID) | RECTAL | Status: DC | PRN

## 2020-02-06 MED ORDER — BUPROPION HCL ER (XL) 150 MG PO TB24
150.0000 mg | ORAL_TABLET | Freq: Every day | ORAL | Status: DC
  Filled 2020-02-06: qty 1

## 2020-02-06 MED ORDER — IOHEXOL 350 MG/ML SOLN
75.0000 mL | Freq: Once | INTRAVENOUS | Status: AC | PRN
  Administered 2020-02-06: 75 mL via INTRAVENOUS

## 2020-02-06 MED ORDER — ALUM & MAG HYDROXIDE-SIMETH 200-200-20 MG/5ML PO SUSP: 30.0000 mL | Freq: Four times a day (QID) | ORAL | Status: DC | PRN

## 2020-02-06 MED ORDER — LORATADINE 10 MG PO TABS
10.0000 mg | ORAL_TABLET | Freq: Every day | ORAL | Status: DC
  Administered 2020-02-07 – 2020-02-08 (×2): 10 mg via ORAL
  Filled 2020-02-06 (×2): qty 1

## 2020-02-06 MED ORDER — NORTRIPTYLINE HCL 10 MG PO CAPS
10.0000 mg | ORAL_CAPSULE | Freq: Every day | ORAL | Status: DC
  Administered 2020-02-06 – 2020-02-08 (×2): 10 mg via ORAL
  Filled 2020-02-06 (×5): qty 1

## 2020-02-06 MED ORDER — DIPHENHYDRAMINE HCL 50 MG/ML IJ SOLN
25.0000 mg | Freq: Once | INTRAMUSCULAR | Status: AC
  Administered 2020-02-06: 25 mg via INTRAVENOUS
  Filled 2020-02-06: qty 1

## 2020-02-06 NOTE — ED Notes (Signed)
EDP at bedside at this time to assess patient. 2nd IV initiated by this RN, 22 to L hand. While this RN attempting IV access and drawing blood pt repeatedly stating that she doesn't feel good and asking for water. This RN repeatedly explained to patient that she could not have anything to drink at this time due to complaints. Pt repeatedly "passing out", lasts approx 2-3 seconds, then patient returns to A&O x 4 and asking for something to drink.

## 2020-02-06 NOTE — H&P (Signed)
History and Physical    Cindy Hays [id number] DOB: 02-27-1981 DOA: 02/06/2020  PCP: Maceo Pro, MD   Patient coming from: home  I have personally briefly reviewed patient's old medical records in [place]  Chief Complaint: headache, syncope  HPI: Cindy Hays is a 39 y.o. female with medical history significant for hypertension, known ANA positive but without diagnosis of autoimmune disorder, chronic headaches including occipital neuralgia receiving nerve blocks by neurology most recently on 3/19, on multiple medications including Pamelor, bupropion, recently started on Zonegran on 3/19, who also has a history of baseline tachycardia in the low 100s, who presented to the emergency room with headache, different from her baseline and had a witnessed syncopal event while sitting in triage.  She states her headache is in the occipital area and a bit stronger than her usual headache.  She has no associated nausea, vomiting or visual disturbance and no one-sided weakness numbness or tingling.  While in the ER she was noted to have several brief syncopal events and would wake up and be normal thereafter ED Course: In the ER she was noted to be persistently tachycardic with heart rates in the 130s to 140s, not improving with an IV fluid bolus.  She would also remain tachycardic while asleep.  EKG read sinus tachycardia.  Troponins were negative x2.  Her work-up was essentially unremarkable.  WBC 10, hemoglobin 10.4.  TSH normal at 1.3, D-dimer slightly elevated 542.  CTA chest was negative for acute PE or other acute disease, CT head negative.  The emergency room provider spoke with neurologist, Dr. Lorraine Lax who did not think her syncopal episodes were related to her recent nerve block on 02/03/2020.  Due to persistent tachycardia, observation requested.  Review of Systems: As per HPI otherwise 10 point review of systems negative.    Past Medical History:  Diagnosis Date  . Anemia     . Hypertension   . Preterm labor     Past Surgical History:  Procedure Laterality Date  . NO PAST SURGERIES       reports that she has never smoked. She has never used smokeless tobacco. She reports previous alcohol use. She reports that she does not use drugs.  Allergies  Allergen Reactions  . Vicodin [Hydrocodone-Acetaminophen] Rash    Family History  Problem Relation Age of Onset  . Hypertension Mother   . Alcohol abuse Mother   . Liver disease Mother   . Hypertension Maternal Grandmother   . Diabetes Maternal Grandmother   . Dementia Maternal Grandmother   . Autoimmune disease Daughter      Prior to Admission medications   Medication Sig Start Date End Date Taking? Authorizing Provider  albuterol (VENTOLIN HFA) 108 (90 Base) MCG/ACT inhaler Inhale 1 puff into the lungs every 6 (six) hours as needed for wheezing or shortness of breath.   Yes [provider]  buPROPion (WELLBUTRIN XL) 150 MG 24 hr tablet Take 150 mg by mouth daily. 12/21/19  Yes [provider]  ferrous sulfate 220 (44 Fe) MG/5ML solution Take 220 mg by mouth 2 (two) times daily with a meal.  11/16/19  Yes [provider]  loratadine (CLARITIN) 10 MG tablet Take 10 mg by mouth daily. 02/03/20  Yes [provider]  medroxyPROGESTERone (DEPO-PROVERA) 150 MG/ML injection Inject 150 mg into the muscle every 3 (three) months.   Yes [provider]  Multiple Vitamin (MULTI-VITAMIN DAILY PO) Take 1 tablet by mouth daily in the afternoon.  Yes [provider]  nortriptyline (PAMELOR) 10 MG capsule TAKE 1 CAPSULE AT BEDTIME FOR 1 WEEK THEN INCREASE TO 2 CAPS AT BEDTIME 12/30/19  Yes [provider]  traZODone (DESYREL) 50 MG tablet Take 1 tablet by mouth daily in the afternoon. 02/03/20  Yes [provider]  Vitamin D, Ergocalciferol, (DRISDOL) 1.25 MG (50000 UNIT) CAPS capsule Take 50,000 Units by mouth once a week. 01/31/20  Yes [provider]  zonisamide (ZONEGRAN) 100 MG capsule Take 100 mg by mouth at bedtime. 02/02/20  Yes [provider]  butalbital-acetaminophen-caffeine (FIORICET) 50-325-40 MG tablet Take 1 tablet by mouth every 6 (six) hours as needed for headache. Not take more than 6 tablets in  24-hours Patient not taking: Reported on 02/06/2020 01/14/20 01/13/21  Lorin Picket, PA-C  prochlorperazine (COMPAZINE) 10 MG tablet Take 1 tablet (10 mg total) by mouth every 6 (six) hours as needed for nausea or vomiting. Patient not taking: Reported on 02/06/2020 12/18/19   Blake Divine, MD  [name] 150-35 MCG/24HR transdermal patch 1 patch once a week. 09/04/19 11/18/19  [provider]    Physical Exam: Vitals:   02/06/20 1712 02/06/20 1906 02/06/20 2038 02/06/20 2039  BP: 109/64 128/64  108/77  Pulse: 99 (!) 135 (!) 138   Resp: (!) 26 (!) 24 17   Temp:      TempSrc:      SpO2: 100% 100% 98% 99%  Weight:      Height:         Vitals:   02/06/20 1712 02/06/20 1906 02/06/20 2038 02/06/20 2039  BP: 109/64 128/64  108/77  Pulse: 99 (!) 135 (!) 138   Resp: (!) 26 (!) 24 17   Temp:      TempSrc:      SpO2: 100% 100% 98% 99%  Weight:      Height:        Constitutional: Alert and awake, oriented x3, not in any acute distress. Eyes: PERLA, EOMI, irises appear normal, anicteric sclera,  ENMT: external ears and nose appear normal, normal hearing             Lips appears normal, oropharynx mucosa, tongue, posterior pharynx appear normal  Neck: neck appears normal, no masses, normal ROM, no thyromegaly, no JVD  CVS: S1-S2 clear, no murmur rubs or gallops,  , no carotid bruits, pedal pulses palpable, No LE edema Respiratory:  clear to auscultation bilaterally, no wheezing, rales or rhonchi. Respiratory effort normal. No accessory muscle use.  Abdomen: soft nontender, nondistended, normal bowel sounds, no hepatosplenomegaly, no hernias Musculoskeletal: : no cyanosis, clubbing , no  contractures or atrophy Neuro: Cranial nerves II-XII intact, sensation, reflexes normal, strength Psych: judgement and insight appear normal, stable mood and affect,  Skin: no rashes or lesions or ulcers, no induration or nodules   Labs on Admission: I have personally reviewed following labs and imaging studies  CBC: Recent Labs  Lab 02/06/20 1710  WBC 10.0  NEUTROABS 6.0  HGB 10.4*  HCT 35.2*  MCV 82.1  PLT [id number]   Basic Metabolic Panel: Recent Labs  Lab 02/06/20 1710  NA 135  K 4.8  CL 104  CO2 19*  GLUCOSE 107*  BUN 9  CREATININE 0.86  CALCIUM 8.7*  MG 2.0   GFR: Estimated Creatinine Clearance: 87.5 mL/min (by C-G formula based on SCr of 0.86 mg/dL). Liver Function Tests: No results for input(s): AST, ALT, ALKPHOS, BILITOT, PROT, ALBUMIN in the last 168 hours. No results  for input(s): LIPASE, AMYLASE in the last 168 hours. No results for input(s): AMMONIA in the last 168 hours. Coagulation Profile: No results for input(s): INR, PROTIME in the last 168 hours. Cardiac Enzymes: No results for input(s): CKTOTAL, CKMB, CKMBINDEX, TROPONINI in the last 168 hours. BNP (last 3 results) No results for input(s): PROBNP in the last 8760 hours. HbA1C: No results for input(s): HGBA1C in the last 72 hours. CBG: No results for input(s): GLUCAP in the last 168 hours. Lipid Profile: No results for input(s): CHOL, HDL, LDLCALC, TRIG, CHOLHDL, LDLDIRECT in the last 72 hours. Thyroid Function Tests: Recent Labs    02/06/20 1710  TSH 1.378   Anemia Panel: No results for input(s): VITAMINB12, FOLATE, FERRITIN, TIBC, IRON, RETICCTPCT in the last 72 hours. Urine analysis:    Component Value Date/Time   COLORURINE STRAW (A) 12/17/2019 1931   APPEARANCEUR CLEAR (A) 12/17/2019 1931   APPEARANCEUR Clear 07/03/2014 0901   LABSPEC 1.008 12/17/2019 1931   LABSPEC 1.001 07/03/2014 0901   PHURINE 6.0 12/17/2019 1931   GLUCOSEU NEGATIVE 12/17/2019 1931   GLUCOSEU Negative  07/03/2014 0901   HGBUR NEGATIVE 12/17/2019 1931   BILIRUBINUR NEGATIVE 12/17/2019 1931   BILIRUBINUR Negative 07/03/2014 0901   KETONESUR NEGATIVE 12/17/2019 1931   PROTEINUR NEGATIVE 12/17/2019 1931   NITRITE NEGATIVE 12/17/2019 1931   LEUKOCYTESUR NEGATIVE 12/17/2019 1931   LEUKOCYTESUR Negative 07/03/2014 0901    Radiological Exams on Admission: CT Head Wo Contrast  Result Date: 02/06/2020 CLINICAL DATA:  Severe headache, tachycardia, syncope EXAM: CT HEAD WITHOUT CONTRAST TECHNIQUE: Contiguous axial images were obtained from the base of the skull through the vertex without intravenous contrast. COMPARISON:  12/18/2019 FINDINGS: Brain: No evidence of acute infarction, hemorrhage, hydrocephalus, extra-axial collection or mass lesion/mass effect. Vascular: No hyperdense vessel or unexpected calcification. Skull: Normal. Negative for fracture or focal lesion. Sinuses/Orbits: The visualized paranasal sinuses are essentially clear. The mastoid air cells are unopacified. Other: None. IMPRESSION: Normal head CT. Electronically Signed   By: Julian Hy M.D.   On: 02/06/2020 17:54   CT Angio Chest PE W and/or Wo Contrast  Result Date: 02/06/2020 CLINICAL DATA:  Shortness of breath. EXAM: CT ANGIOGRAPHY CHEST WITH CONTRAST TECHNIQUE: Multidetector CT imaging of the chest was performed using the standard protocol during bolus administration of intravenous contrast. Multiplanar CT image reconstructions and MIPs were obtained to evaluate the vascular anatomy. CONTRAST:  27mL OMNIPAQUE IOHEXOL 350 MG/ML SOLN COMPARISON:  July 03, 2014 FINDINGS: Cardiovascular: Satisfactory opacification of the pulmonary arteries to the segmental level. No evidence of pulmonary embolism. Normal heart size. No pericardial effusion. Mediastinum/Nodes: No enlarged mediastinal, hilar, or axillary lymph nodes. Thyroid gland, trachea, and esophagus demonstrate no significant findings. Lungs/Pleura: A stable 5 mm  noncalcified lung nodule is seen within the anterolateral aspect of the right lower lobe (axial CT image 36, CT series number 6). There is no evidence of acute infiltrate, pleural effusion or pneumothorax. Upper Abdomen: No acute abnormality. Musculoskeletal: No chest wall abnormality. No acute or significant osseous findings. Review of the MIP images confirms the above findings. IMPRESSION: 1. No CT evidence of pulmonary embolism or other acute intrathoracic pathology. 2. Stable 5 mm noncalcified right lower lobe lung nodule. Electronically Signed   By: Virgina Norfolk M.D.   On: 02/06/2020 18:51    EKG: Independently reviewed.   Assessment/Plan  Recurrent syncope Sinus tachycardia Polypharmacy Chronic headaches, occipital neuralgia -Patient has a longstanding history of episodic lightheadedness and presyncopal events according to the past documentation -Etiology  for her current episode associated with persistent sinus tachycardia is uncertain -Patient noted to be on multiple psychotropics including tramadol, nortriptyline, melatonin, recently started Zonegran on 02/03/2020. -CT head was negative, CTA chest negative for PE, TSH within normal limits -Continuous cardiac monitoring to evaluate for other tachyarrhythmias.  She is currently in sinus tachycardia -Echocardiogram in the a.m. -Orthostatic vital signs and neurologic checks -Empiric IV hydration -Might benefit from a sleep study.  -will get blood gas to eval for CO2 retention/narcosis --Continue neurology outpatient follow-up      ANA positive -Patient known ANA positive -Might benefit from outpatient rheumatology referral    DVT prophylaxis: Lovenox  Code Status: full code  Family Communication:  none  Disposition Plan: Back to previous home environment Consults called: none  Status:obs    Athena Masse MD [facility]     02/06/2020, 9:16 PM

## 2020-02-06 NOTE — ED Notes (Signed)
Pt ambulated with HR fluctuation of 130bpm-140bpm. MD made aware. Pt reports dizziness.

## 2020-02-06 NOTE — ED Notes (Signed)
Pt ambulated to restroom w/o difficulty or need for assistance. Lab called to draw labs. Respiratory called to draw ABG.

## 2020-02-06 NOTE — ED Provider Notes (Signed)
[facility] Emergency Department Provider Note  ____________________________________________   First MD Initiated Contact with Patient 02/06/20 1707     (approximate)  I have reviewed the triage vital signs and the nursing notes.   HISTORY  Chief Complaint Loss of Consciousness and Tachycardia    HPI Cindy Hays is a 39 y.o. female  With h/o HTN, anemia, here with syncopal episode and HA. Pt reports that she felt fine earlier today. This afternoon, she began to feel like she was developing a significant posterior headache, which is not unusual for her given known h/o occipital neuralgia s/p recent injections @ neurology. She then began to feel very flushed, hot, and lightheaded like she was going to pass out. She states that the next thing she knew, she had passed out. She has had recurrent episodes like this since then. No CP. No palpitations. She feels "weak, flushed" right before the episodes. Her HA has worsened since onset as well. No visual changes, no focal numbness or weakness. H/o chronic HA, thuogh has not felt as lightheaded w/ others. Dizziness/lightheadedness is worse sitting up or standing up. No recent med changes though she does report increased caffeine usage at instruction of her neurologists.        Past Medical History:  Diagnosis Date  . Anemia   . Hypertension   . Preterm labor     Patient Active Problem List   Diagnosis Date Noted  . Iron deficiency anemia 10/28/2019  . Pregnancy 08/04/2018  . Indication for care in labor or delivery 08/03/2018  . Labor and delivery indication for care or intervention 08/02/2018  . Chronic hypertension with superimposed pre-eclampsia 07/30/2018  . Chronic hypertension with superimposed preeclampsia 07/21/2018  . Short interval between pregnancies affecting pregnancy in first trimester, antepartum 03/18/2018  . Advanced maternal age in multigravida, first trimester 03/18/2018  . Fetal demise,  greater than 22 weeks, antepartum, single gestation 11/13/2017    Past Surgical History:  Procedure Laterality Date  . NO PAST SURGERIES      Prior to Admission medications   Medication Sig Start Date End Date Taking? Authorizing Provider  albuterol (VENTOLIN HFA) 108 (90 Base) MCG/ACT inhaler Inhale 1 puff into the lungs every 6 (six) hours as needed for wheezing or shortness of breath.   Yes [provider]  buPROPion (WELLBUTRIN XL) 150 MG 24 hr tablet Take 150 mg by mouth daily. 12/21/19  Yes [provider]  ferrous sulfate 220 (44 Fe) MG/5ML solution Take 220 mg by mouth 2 (two) times daily with a meal.  11/16/19  Yes [provider]  loratadine (CLARITIN) 10 MG tablet Take 10 mg by mouth daily. 02/03/20  Yes [provider]  medroxyPROGESTERone (DEPO-PROVERA) 150 MG/ML injection Inject 150 mg into the muscle every 3 (three) months.   Yes [provider]  Multiple Vitamin (MULTI-VITAMIN DAILY PO) Take 1 tablet by mouth daily in the afternoon.    Yes [provider]  nortriptyline (PAMELOR) 10 MG capsule TAKE 1 CAPSULE AT BEDTIME FOR 1 WEEK THEN INCREASE TO 2 CAPS AT BEDTIME 12/30/19  Yes [provider]  traZODone (DESYREL) 50 MG tablet Take 1 tablet by mouth daily in the afternoon. 02/03/20  Yes [provider]  Vitamin D, Ergocalciferol, (DRISDOL) 1.25 MG (50000 UNIT) CAPS capsule Take 50,000 Units by mouth once a week. 01/31/20  Yes [provider]  zonisamide (ZONEGRAN) 100 MG capsule Take 100 mg by mouth at bedtime. 02/02/20  Yes [provider]  butalbital-acetaminophen-caffeine (FIORICET) 50-325-40 MG tablet Take 1 tablet by mouth every 6 (six) hours as needed for headache. Not take more than 6 tablets in  24-hours Patient not taking: Reported on 02/06/2020 01/14/20 01/13/21  Lorin Picket, PA-C  prochlorperazine (COMPAZINE) 10 MG tablet Take 1 tablet (10 mg total) by mouth every 6 (six) hours as  needed for nausea or vomiting. Patient not taking: Reported on 02/06/2020 12/18/19   Blake Divine, MD  [name] 150-35 MCG/24HR transdermal patch 1 patch once a week. 09/04/19 11/18/19  [provider]    Allergies Vicodin [hydrocodone-acetaminophen]  Family History  Problem Relation Age of Onset  . Hypertension Mother   . Alcohol abuse Mother   . Liver disease Mother   . Hypertension Maternal Grandmother   . Diabetes Maternal Grandmother   . Dementia Maternal Grandmother   . Autoimmune disease Daughter     Social History Social History   Tobacco Use  . Smoking status: Never Smoker  . Smokeless tobacco: Never Used  Substance Use Topics  . Alcohol use: Not Currently  . Drug use: No    Review of Systems  Review of Systems  Constitutional: Positive for fatigue. Negative for fever.  HENT: Negative for congestion and sore throat.   Eyes: Negative for visual disturbance.  Respiratory: Negative for cough and shortness of breath.   Cardiovascular: Positive for chest pain.  Gastrointestinal: Positive for nausea. Negative for abdominal pain, diarrhea and vomiting.  Genitourinary: Negative for flank pain.  Musculoskeletal: Negative for back pain and neck pain.  Skin: Negative for rash and wound.  Neurological: Positive for syncope and light-headedness. Negative for weakness.  All other systems reviewed and are negative.    ____________________________________________  PHYSICAL EXAM:      VITAL SIGNS: ED Triage Vitals  Enc Vitals Group     BP 02/06/20 1643 128/78     Pulse Rate 02/06/20 1643 (!) 141     Resp 02/06/20 1643 (!) 26     Temp 02/06/20 1643 98.1 F (36.7 C)     Temp Source 02/06/20 1643 Oral     SpO2 02/06/20 1643 100 %     Weight 02/06/20 1656 180 lb (81.6 kg)     Height 02/06/20 1656 5\' 1"  (1.549 m)     Head Circumference --      Peak Flow --      Pain Score 02/06/20 1656 10     Pain Loc --      Pain Edu? --      Excl. in [place]? --       Physical Exam Vitals and nursing note reviewed.  Constitutional:      General: She is not in acute distress.    Appearance: She is well-developed.  HENT:     Head: Normocephalic and atraumatic.  Eyes:     Conjunctiva/sclera: Conjunctivae normal.  Cardiovascular:     Rate and Rhythm: Regular rhythm. Tachycardia present.     Heart sounds: Normal heart sounds. No murmur. No friction rub.  Pulmonary:     Effort: Pulmonary effort is normal. No respiratory distress.     Breath sounds: Normal breath sounds. No wheezing or rales.  Abdominal:     General: There is no distension.     Palpations: Abdomen is soft.     Tenderness: There is no abdominal tenderness.  Musculoskeletal:     Cervical back: Neck supple.  Skin:    General: Skin is warm.     Capillary Refill:  Capillary refill takes less than 2 seconds.  Neurological:     Mental Status: She is alert and oriented to person, place, and time.     Motor: No abnormal muscle tone.     Comments: Neurological Exam:  Mental Status: Alert and oriented to person, place, and time. Attention and concentration normal. Speech clear. Recent memory is intact. Cranial Nerves: Visual fields grossly intact. EOMI and PERRLA. No nystagmus noted. Facial sensation intact at forehead, maxillary cheek, and chin/mandible bilaterally. No facial asymmetry or weakness. Hearing grossly normal. Uvula is midline, and palate elevates symmetrically. Normal SCM and trapezius strength. Tongue midline without fasciculations. Motor: Muscle strength 5/5 in proximal and distal UE and LE bilaterally. No pronator drift. Muscle tone normal. Sensation: Intact to light touch in upper and lower extremities distally bilaterally.  Gait: Normal without ataxia. Coordination: Normal FTN bilaterally.          ____________________________________________   LABS (all labs ordered are listed, but only abnormal results are displayed)  Labs Reviewed  CBC WITH  DIFFERENTIAL/PLATELET - Abnormal; Notable for the following components:      Result Value   Hemoglobin 10.4 (*)    HCT 35.2 (*)    MCH 24.2 (*)    MCHC 29.5 (*)    RDW 17.2 (*)    Monocytes Absolute 1.1 (*)    All other components within normal limits  BASIC METABOLIC PANEL - Abnormal; Notable for the following components:   CO2 19 (*)    Glucose, Bld 107 (*)    Calcium 8.7 (*)    All other components within normal limits  FIBRIN DERIVATIVES D-DIMER ([facility] ONLY) - Abnormal; Notable for the following components:   Fibrin derivatives D-dimer ([facility]) 542.52 (*)    All other components within normal limits  MAGNESIUM  TSH  URINE DRUG SCREEN, QUALITATIVE ([facility] ONLY)  POC URINE PREG, ED  TROPONIN I (HIGH SENSITIVITY)  TROPONIN I (HIGH SENSITIVITY)    ____________________________________________  EKG: sinus tachycardia, VR 133. PR 152, QRS 76, QTc 422. No acute st elevations or depressions. No signs of acute ischemia. No Brugada or delta waves. ________________________________________  RADIOLOGY All imaging, including plain films, CT scans, and ultrasounds, independently reviewed by me, and interpretations confirmed via formal radiology reads.  ED MD interpretation:   CT Head: [place]  Official radiology report(s): CT Head Wo Contrast  Result Date: 02/06/2020 CLINICAL DATA:  Severe headache, tachycardia, syncope EXAM: CT HEAD WITHOUT CONTRAST TECHNIQUE: Contiguous axial images were obtained from the base of the skull through the vertex without intravenous contrast. COMPARISON:  12/18/2019 FINDINGS: Brain: No evidence of acute infarction, hemorrhage, hydrocephalus, extra-axial collection or mass lesion/mass effect. Vascular: No hyperdense vessel or unexpected calcification. Skull: Normal. Negative for fracture or focal lesion. Sinuses/Orbits: The visualized paranasal sinuses are essentially clear. The mastoid air cells are unopacified. Other: None. IMPRESSION: Normal head CT.  Electronically Signed   By: Julian Hy M.D.   On: 02/06/2020 17:54   CT Angio Chest PE W and/or Wo Contrast  Result Date: 02/06/2020 CLINICAL DATA:  Shortness of breath. EXAM: CT ANGIOGRAPHY CHEST WITH CONTRAST TECHNIQUE: Multidetector CT imaging of the chest was performed using the standard protocol during bolus administration of intravenous contrast. Multiplanar CT image reconstructions and MIPs were obtained to evaluate the vascular anatomy. CONTRAST:  42mL OMNIPAQUE IOHEXOL 350 MG/ML SOLN COMPARISON:  July 03, 2014 FINDINGS: Cardiovascular: Satisfactory opacification of the pulmonary arteries to the segmental level. No evidence of pulmonary embolism. Normal heart size. No pericardial effusion.  Mediastinum/Nodes: No enlarged mediastinal, hilar, or axillary lymph nodes. Thyroid gland, trachea, and esophagus demonstrate no significant findings. Lungs/Pleura: A stable 5 mm noncalcified lung nodule is seen within the anterolateral aspect of the right lower lobe (axial CT image 36, CT series number 6). There is no evidence of acute infiltrate, pleural effusion or pneumothorax. Upper Abdomen: No acute abnormality. Musculoskeletal: No chest wall abnormality. No acute or significant osseous findings. Review of the MIP images confirms the above findings. IMPRESSION: 1. No CT evidence of pulmonary embolism or other acute intrathoracic pathology. 2. Stable 5 mm noncalcified right lower lobe lung nodule. Electronically Signed   By: Virgina Norfolk M.D.   On: 02/06/2020 18:51    ____________________________________________  PROCEDURES   Procedure(s) performed (including Critical Care):  Procedures  ____________________________________________  INITIAL IMPRESSION / MDM / [place] / ED COURSE  As part of my medical decision making, I reviewed the following data within the [facility] notes reviewed and incorporated, Old chart reviewed, Notes from prior ED visits,  and [place] Controlled Substance [place] was evaluated in Emergency Department on 02/06/2020 for the symptoms described in the history of present illness. She was evaluated in the context of the global COVID-19 pandemic, which necessitated consideration that the patient might be at risk for infection with the SARS-CoV-2 virus that causes COVID-19. Institutional protocols and algorithms that pertain to the evaluation of patients at risk for COVID-19 are in a state of rapid change based on information released by regulatory bodies including the CDC and federal and state organizations. These policies and algorithms were followed during the patient's care in the ED.  Some ED evaluations and interventions may be delayed as a result of limited staffing during the pandemic.*     Medical Decision Making:  39 yo F here with recurrent syncope. Pt in sinus tachycardia here. Unclear trigger - she was recently started on multiple headache medications and underwent occipital nerve block 3/19.  She is here today with recurrent near syncopal episodes.  She does appear mildly clinically dehydrated, though she has had no improvement with 2 L of fluid and she is not orthostatic.  Extensive work-up shows no apparent etiology.  CT angio negative for PE, cardiomegaly, or pulmonary abnormality.  Lab work shows mildly decreased bicarb likely due to dehydration, but is otherwise unremarkable.  She is afebrile.  She denies any drug use.  No recent stimulant use, over-the-counter Sudafed or other decongestant use, and she denies any significant increase in caffeine intake, though she did have 1 cup of coffee today.  She does not appear anxious and had no improvement with Ativan.  She is persistently tachycardic to the 130s despite being asleep.  While I have found mention of tachycardia and her previous OB records, she had been referred to a cardiologist as well, but did not make this appointment.  She has a history  of being ANA positive with a history of autoimmune conditions in the family, but no known history of lupus or valvular disorder.  She remains tachycardic to the 150s with ambulation in the ED.  Will plan to observe until she is asymptomatic her vitals are more reassuring. I did call and discuss with Dr. Lorraine Lax of Neurology, who doubts this is related to her recent occipital nerve block or possible IIH.  ____________________________________________  FINAL CLINICAL IMPRESSION(S) / ED DIAGNOSES  Final diagnoses:  Syncope, unspecified syncope type     MEDICATIONS  GIVEN DURING THIS VISIT:  Medications  sodium chloride 0.9 % bolus 1,000 mL (0 mLs Intravenous Stopped 02/06/20 2028)  prochlorperazine (COMPAZINE) injection 10 mg (10 mg Intravenous Given 02/06/20 1738)  diphenhydrAMINE (BENADRYL) injection 25 mg (25 mg Intravenous Given 02/06/20 1738)  sodium chloride 0.9 % bolus 1,000 mL (0 mLs Intravenous Stopped 02/06/20 2028)  iohexol (OMNIPAQUE) 350 MG/ML injection 75 mL (75 mLs Intravenous Contrast Given 02/06/20 1839)  LORazepam (ATIVAN) injection 1 mg (1 mg Intravenous Given 02/06/20 1915)     ED Discharge Orders    None       Note:  This document was prepared using Dragon voice recognition software and may include unintentional dictation errors.   Duffy Bruce, MD 02/06/20 2047

## 2020-02-06 NOTE — ED Notes (Signed)
Pt transported to CT at this time.

## 2020-02-06 NOTE — ED Triage Notes (Signed)
Pt presents to ED via [facility], pt with syncopal episode in triage. Pt noted to be tachycardic upon arrival to triage room. Noted that patient c/o HA then has syncopal episode while interacting with this RN. Pt while awake noted to be interacting appropriately, c/o 10/10 HA at this time.

## 2020-02-07 ENCOUNTER — Encounter: Payer: Self-pay | Admitting: Internal Medicine

## 2020-02-07 ENCOUNTER — Observation Stay (HOSPITAL_BASED_OUTPATIENT_CLINIC_OR_DEPARTMENT_OTHER)
Admit: 2020-02-07 | Discharge: 2020-02-07 | Disposition: A | Discharge status: Home / Self Care | Payer: 59 | Attending: Internal Medicine | Admitting: Internal Medicine

## 2020-02-07 ENCOUNTER — Other Ambulatory Visit: Payer: Self-pay

## 2020-02-07 ENCOUNTER — Observation Stay: Payer: 59

## 2020-02-07 DIAGNOSIS — R768 Other specified abnormal immunological findings in serum: Secondary | ICD-10-CM

## 2020-02-07 DIAGNOSIS — R519 Headache, unspecified: Secondary | ICD-10-CM | POA: Diagnosis not present

## 2020-02-07 DIAGNOSIS — M5481 Occipital neuralgia: Secondary | ICD-10-CM | POA: Diagnosis not present

## 2020-02-07 DIAGNOSIS — R55 Syncope and collapse: Secondary | ICD-10-CM | POA: Diagnosis not present

## 2020-02-07 DIAGNOSIS — R000 Tachycardia, unspecified: Secondary | ICD-10-CM | POA: Diagnosis not present

## 2020-02-07 DIAGNOSIS — G8929 Other chronic pain: Secondary | ICD-10-CM

## 2020-02-07 LAB — BLOOD GAS, ARTERIAL
Acid-base deficit: 2.8 mmol/L — ABNORMAL HIGH (ref 0.0–2.0)
Bicarbonate: 21.9 mmol/L (ref 20.0–28.0)
FIO2: 0.21
O2 Saturation: 97 %
Patient temperature: 37
pCO2 arterial: 37 mmHg (ref 32.0–48.0)
pH, Arterial: 7.38 (ref 7.350–7.450)
pO2, Arterial: 92 mmHg (ref 83.0–108.0)

## 2020-02-07 LAB — HIV ANTIBODY (ROUTINE TESTING W REFLEX): HIV Screen 4th Generation wRfx: NONREACTIVE

## 2020-02-07 LAB — ECHOCARDIOGRAM COMPLETE
Height: 61 in
Weight: 2880 oz

## 2020-02-07 LAB — GLUCOSE, CAPILLARY
Glucose-Capillary: 89 mg/dL (ref 70–99)
Glucose-Capillary: 78 mg/dL (ref 70–99)

## 2020-02-07 LAB — SARS CORONAVIRUS 2 (TAT 6-24 HRS): SARS Coronavirus 2: NEGATIVE

## 2020-02-07 MED ORDER — TRAZODONE HCL 50 MG PO TABS: 50.0000 mg | ORAL_TABLET | Freq: Every day | ORAL | Status: DC

## 2020-02-07 MED ORDER — GADOBUTROL 1 MMOL/ML IV SOLN
8.0000 mL | Freq: Once | INTRAVENOUS | Status: AC | PRN
  Administered 2020-02-07: 8 mL via INTRAVENOUS
  Filled 2020-02-07: qty 8

## 2020-02-07 MED ORDER — HYDROXYZINE HCL 50 MG PO TABS
50.0000 mg | ORAL_TABLET | Freq: Three times a day (TID) | ORAL | Status: DC | PRN
  Filled 2020-02-07: qty 1

## 2020-02-07 MED ORDER — TRAZODONE HCL 50 MG PO TABS
50.0000 mg | ORAL_TABLET | Freq: Every day | ORAL | Status: DC
  Administered 2020-02-08: 50 mg via ORAL

## 2020-02-07 NOTE — ED Notes (Signed)
Pt ambulated on own to toilet. Pt denies any dizziness at this time. Pt provided ice water and warm blanket.

## 2020-02-07 NOTE — ED Notes (Signed)
Pt c.o feeling anxious. MD Memon notified, to place orders

## 2020-02-07 NOTE — Progress Notes (Signed)
*  PRELIMINARY RESULTS* Echocardiogram 2D Echocardiogram has been performed.  Cindy Hays 02/07/2020, 10:10 AM

## 2020-02-07 NOTE — ED Notes (Signed)
Thorough report given to Deidra RN on assigned floor. Patient verbalized understanding of admission. Patient transported to EEG and then to floor. Patient transported via stretcher.

## 2020-02-07 NOTE — Consult Note (Signed)
Reason for Consult:Syncope Requesting Physician: Memon  CC: Syncope  I have been asked by Dr. Roderic Palau to see this patient in consultation for syncope, headaches.  HPI: Cindy Hays is an 39 y.o. female with a history of HTN and anemia who has had headaches since February and is seeing Dr. Manuella Ghazi on an outpatient basis.  Patient's headaches diagnosed as occipital neuralgia and patient reports improvement since injections.  Patient presented to the emergency room with headache, different from her baseline and syncopal events.  Patient reports while at home having at least 5 syncopal events, each a couple of minutes apart. Each lasting a few seconds. Each was preceded by tingling in her hands and feet and a hot sensation that would come over her.  She would then pass out.  Had one event in triage and multiple in the ED as well.  Patient was tachycardic.    Past Medical History:  Diagnosis Date  . Anemia   . Hypertension   . Preterm labor     Past Surgical History:  Procedure Laterality Date  . NO PAST SURGERIES      Family History  Problem Relation Age of Onset  . Hypertension Mother   . Alcohol abuse Mother   . Liver disease Mother   . Hypertension Maternal Grandmother   . Diabetes Maternal Grandmother   . Dementia Maternal Grandmother   . Autoimmune disease Daughter     Social History:  reports that she has never smoked. She has never used smokeless tobacco. She reports previous alcohol use. She reports that she does not use drugs.  Allergies  Allergen Reactions  . Vicodin [Hydrocodone-Acetaminophen] Rash    Medications: I have reviewed the patient's current medications. Prior to Admission medications   Medication Sig Start Date End Date Taking? Authorizing Provider  albuterol (VENTOLIN HFA) 108 (90 Base) MCG/ACT inhaler Inhale 1 puff into the lungs every 6 (six) hours as needed for wheezing or shortness of breath.   Yes [provider]         ferrous sulfate 220  (44 Fe) MG/5ML solution Take 220 mg by mouth 2 (two) times daily with a meal.  11/16/19  Yes [provider]  loratadine (CLARITIN) 10 MG tablet Take 10 mg by mouth daily. 02/03/20  Yes [provider]  medroxyPROGESTERone (DEPO-PROVERA) 150 MG/ML injection Inject 150 mg into the muscle every 3 (three) months.   Yes [provider]  Multiple Vitamin (MULTI-VITAMIN DAILY PO) Take 1 tablet by mouth daily in the afternoon.    Yes [provider]  nortriptyline (PAMELOR) 10 MG capsule TAKE 1 CAPSULE AT BEDTIME FOR 1 WEEK THEN INCREASE TO 2 CAPS AT BEDTIME 12/30/19  Yes [provider]  traZODone (DESYREL) 50 MG tablet Take 1 tablet by mouth daily in the afternoon. 02/03/20  Yes [provider]  Vitamin D, Ergocalciferol, (DRISDOL) 1.25 MG (50000 UNIT) CAPS capsule Take 50,000 Units by mouth once a week. 01/31/20  Yes [provider]  zonisamide (ZONEGRAN) 100 MG capsule Take 100 mg by mouth at bedtime. 02/02/20  Yes [provider]  butalbital-acetaminophen-caffeine (FIORICET) 50-325-40 MG tablet Take 1 tablet by mouth every 6 (six) hours as needed for headache. Not take more than 6 tablets in  24-hours Patient not taking: Reported on 02/06/2020 01/14/20 01/13/21  Lorin Picket, PA-C  prochlorperazine (COMPAZINE) 10 MG tablet Take 1 tablet (10 mg total) by mouth every 6 (six) hours as needed for nausea or vomiting. Patient not taking: Reported  on 02/06/2020 12/18/19   Blake Divine, MD  [name] 150-35 MCG/24HR transdermal patch 1 patch once a week. 09/04/19 11/18/19  [provider]    ROS: History obtained from the patient  General ROS: negative for - chills, fatigue, fever, night sweats, weight gain or weight loss Psychological ROS: negative for - behavioral disorder, hallucinations, memory difficulties, mood swings or suicidal ideation Ophthalmic ROS: intermittent blurry vision ENT ROS: negative for - epistaxis, nasal  discharge, oral lesions, sore throat, tinnitus or vertigo Allergy and Immunology ROS: negative for - hives or itchy/watery eyes Hematological and Lymphatic ROS: negative for - bleeding problems, bruising or swollen lymph nodes Endocrine ROS: negative for - galactorrhea, hair pattern changes, polydipsia/polyuria or temperature intolerance Respiratory ROS: negative for - cough, hemoptysis, shortness of breath or wheezing Cardiovascular ROS: negative for - chest pain, dyspnea on exertion, edema or irregular heartbeat Gastrointestinal ROS: negative for - abdominal pain, diarrhea, hematemesis, nausea/vomiting or stool incontinence Genito-Urinary ROS: negative for - dysuria, hematuria, incontinence or urinary frequency/urgency Musculoskeletal ROS: negative for - joint swelling or muscular weakness Neurological ROS: as noted in HPI Dermatological ROS: negative for rash and skin lesion changes  Physical Examination: Blood pressure 118/68, pulse 91, temperature 98.1 F (36.7 C), temperature source Oral, resp. rate 15, height 5\' 1"  (1.549 m), weight 81.6 kg, last menstrual period 01/09/2020, SpO2 100 %, not currently breastfeeding.  HEENT-  Normocephalic, no lesions, without obvious abnormality.  Normal external eye and conjunctiva.  Normal TM's bilaterally.  Normal auditory canals and external ears. Normal external nose, mucus membranes and septum.  Normal pharynx. Cardiovascular- S1, S2 normal, pulses palpable throughout   Lungs- chest clear, no wheezing, rales, normal symmetric air entry Abdomen- soft, non-tender; bowel sounds normal; no masses,  no organomegaly Extremities- no edema Lymph-no adenopathy palpable Musculoskeletal-no joint tenderness, deformity or swelling Skin-warm and dry, no hyperpigmentation, vitiligo, or suspicious lesions  Neurological Examination   Mental Status: Alert, oriented, thought content appropriate.  Speech fluent without evidence of aphasia.  Able to follow 3 step  commands without difficulty. Cranial Nerves: II: Discs flat bilaterally; Visual fields grossly normal, pupils equal, round, reactive to light and accommodation III,IV, VI: ptosis not present, extra-ocular motions intact bilaterally V,VII: smile symmetric, facial light touch sensation normal bilaterally VIII: hearing normal bilaterally IX,X: gag reflex present XI: bilateral shoulder shrug XII: midline tongue extension Motor: Right : Upper extremity   5/5    Left:     Upper extremity   5/5  Lower extremity   5/5     Lower extremity   5/5 Tone and bulk:normal tone throughout; no atrophy noted Sensory: Pinprick and light touch decreased in the RUE Deep Tendon Reflexes: Symmetric throughout Plantars: Right: downgoing   Left: downgoing Cerebellar: Normal finger-to-nose and normal heel-to-shin testing bilaterally Gait: not tested due to safety concerns    Laboratory Studies:   Basic Metabolic Panel: Recent Labs  Lab 02/06/20 1710  NA 135  K 4.8  CL 104  CO2 19*  GLUCOSE 107*  BUN 9  CREATININE 0.86  CALCIUM 8.7*  MG 2.0    Liver Function Tests: No results for input(s): AST, ALT, ALKPHOS, BILITOT, PROT, ALBUMIN in the last 168 hours. No results for input(s): LIPASE, AMYLASE in the last 168 hours. No results for input(s): AMMONIA in the last 168 hours.  CBC: Recent Labs  Lab 02/06/20 1710  WBC 10.0  NEUTROABS 6.0  HGB 10.4*  HCT 35.2*  MCV 82.1  PLT 396    Cardiac Enzymes:  No results for input(s): CKTOTAL, CKMB, CKMBINDEX, TROPONINI in the last 168 hours.  BNP: Invalid input(s): POCBNP  CBG: Recent Labs  Lab 02/07/20 0534 02/07/20 0916  GLUCAP 89 78    Microbiology: Results for orders placed or performed during the hospital encounter of 02/06/20  SARS CORONAVIRUS 2 (TAT 6-24 HRS) Nasopharyngeal Nasopharyngeal Swab     Status: None   Collection Time: 02/06/20 10:09 PM   Specimen: Nasopharyngeal Swab  Result Value Ref Range Status   SARS Coronavirus 2  NEGATIVE NEGATIVE Final    Comment: (NOTE) SARS-CoV-2 target nucleic acids are NOT DETECTED. The SARS-CoV-2 RNA is generally detectable in upper and lower respiratory specimens during the acute phase of infection. Negative results do not preclude SARS-CoV-2 infection, do not rule out co-infections with other pathogens, and should not be used as the sole basis for treatment or other patient management decisions. Negative results must be combined with clinical observations, patient history, and epidemiological information. The expected result is Negative. Fact Sheet for Patients: SugarRoll.be Fact Sheet for Healthcare Providers: https://www.woods-mathews.com/ This test is not yet approved or cleared by the [place] FDA and  has been authorized for detection and/or diagnosis of SARS-CoV-2 by FDA under an Emergency Use Authorization (EUA). This EUA will remain  in effect (meaning this test can be used) for the duration of the COVID-19 declaration under Section 56 4(b)(1) of the Act, 21 U.S.C. section 360bbb-3(b)(1), unless the authorization is terminated or revoked sooner. Performed at [facility], [place] [address], [place], [place] [address]     Coagulation Studies: No results for input(s): LABPROT, INR in the last 72 hours.  Urinalysis: No results for input(s): COLORURINE, LABSPEC, PHURINE, GLUCOSEU, HGBUR, BILIRUBINUR, KETONESUR, PROTEINUR, UROBILINOGEN, NITRITE, LEUKOCYTESUR in the last 168 hours.  Invalid input(s): APPERANCEUR  Lipid Panel:  No results found for: CHOL, TRIG, HDL, CHOLHDL, VLDL, LDLCALC  HgbA1C:  Lab Results  Component Value Date   HGBA1C 5.7 (H) 09/10/2019    Urine Drug Screen:      Component Value Date/Time   LABOPIA NONE DETECTED 02/06/2020 2040   COCAINSCRNUR NONE DETECTED 02/06/2020 2040   LABBENZ NONE DETECTED 02/06/2020 2040   AMPHETMU NONE DETECTED 02/06/2020 2040   THCU POSITIVE (A)  02/06/2020 2040   LABBARB POSITIVE (A) 02/06/2020 2040    Alcohol Level: No results for input(s): ETH in the last 168 hours.  Other results: EKG: sinus tachycardia at 133 bpm  Imaging: CT Head Wo Contrast  Result Date: 02/06/2020 CLINICAL DATA:  Severe headache, tachycardia, syncope EXAM: CT HEAD WITHOUT CONTRAST TECHNIQUE: Contiguous axial images were obtained from the base of the skull through the vertex without intravenous contrast. COMPARISON:  12/18/2019 FINDINGS: Brain: No evidence of acute infarction, hemorrhage, hydrocephalus, extra-axial collection or mass lesion/mass effect. Vascular: No hyperdense vessel or unexpected calcification. Skull: Normal. Negative for fracture or focal lesion. Sinuses/Orbits: The visualized paranasal sinuses are essentially clear. The mastoid air cells are unopacified. Other: None. IMPRESSION: Normal head CT. Electronically Signed   By: Julian Hy M.D.   On: 02/06/2020 17:54   CT Angio Chest PE W and/or Wo Contrast  Result Date: 02/06/2020 CLINICAL DATA:  Shortness of breath. EXAM: CT ANGIOGRAPHY CHEST WITH CONTRAST TECHNIQUE: Multidetector CT imaging of the chest was performed using the standard protocol during bolus administration of intravenous contrast. Multiplanar CT image reconstructions and MIPs were obtained to evaluate the vascular anatomy. CONTRAST:  11mL OMNIPAQUE IOHEXOL 350 MG/ML SOLN COMPARISON:  July 03, 2014 FINDINGS: Cardiovascular: Satisfactory opacification  of the pulmonary arteries to the segmental level. No evidence of pulmonary embolism. Normal heart size. No pericardial effusion. Mediastinum/Nodes: No enlarged mediastinal, hilar, or axillary lymph nodes. Thyroid gland, trachea, and esophagus demonstrate no significant findings. Lungs/Pleura: A stable 5 mm noncalcified lung nodule is seen within the anterolateral aspect of the right lower lobe (axial CT image 36, CT series number 6). There is no evidence of acute infiltrate, pleural  effusion or pneumothorax. Upper Abdomen: No acute abnormality. Musculoskeletal: No chest wall abnormality. No acute or significant osseous findings. Review of the MIP images confirms the above findings. IMPRESSION: 1. No CT evidence of pulmonary embolism or other acute intrathoracic pathology. 2. Stable 5 mm noncalcified right lower lobe lung nodule. Electronically Signed   By: Virgina Norfolk M.D.   On: 02/06/2020 18:51     Assessment/Plan: 39 y.o. female with a history of occipital neuralgia, HTN and anemia presenting with headache and frequent, recurrent syncopal episodes.  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  Patient at baseline currently with a headache rated at 4/10.  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  Head CT reviewed and shows no acute changes.  Further work up recommended.  Fibrinogen slightly elevated.  TSH normal.  Echocardiogram pending.    Recommendations: 1. MRI,  MRA, MRV of the brain  2. Patient referred for an LP by Dr. Manuella Ghazi.  Will perform while hospitalized and obtain opening pressure.  Patient has had Lovenox within the last 24 hours.  Will need to wait until has been at least 24 hours.  Hold Lovenox  3. EEG 4. Telemetry 5. Orthostatic vitals   Alexis Goodell, MD Neurology [phone] 02/07/2020, 12:14 PM

## 2020-02-07 NOTE — ED Notes (Signed)
Pt to MRI at this time.

## 2020-02-07 NOTE — Progress Notes (Signed)
PROGRESS NOTE    Cindy Hays  [id number] DOB: 01/21/1981 DOA: 02/06/2020 PCP: Maceo Pro, MD    Brief Narrative:  39 year old female with a history of positive ANA test but does not carry a diagnosis of autoimmune disorder, chronic headaches who is followed by outpatient neurology and recently underwent occipital nerve block on 3/19.  She is on multiple medications for headaches.  She presents to the hospital with tachycardia, worsening headache and head with no syncopal episodes while sitting in triage.  He was noted to have a heart rate in the 130s to 140s.  D-dimer mildly elevated, but CT chest did not show any evidence of PE.  CT head was also unrevealing.  Echocardiogram has been ordered.  Further input from neurology and cardiology has been requested.   Assessment & Plan:   Active Problems:   Chronic headaches   Recurrent syncope   Occipital neuralgia   Tachycardia   ANA positive   Syncope   1. Syncope.  Patient had multiple episodes of syncope, which occurred in the emergency room and also prior to admission.  These episodes lasted a few seconds.  Afterwards, patient did report feeling lethargic.  Etiology of her symptoms is not entirely clear.  She was also tachycardic which has since improved.  She is not had any prior episodes of the same.  Echocardiogram has been ordered.  Will consult cardiology to see if she would benefit from outpatient event monitor.  She may also benefit from EEG, but will consult neurology for further input. 2. Chronic headaches.  She is followed by Dr. Manuella Ghazi in the outpatient setting and recently had occipital nerve block on 3/19. 3. ANA positive.  Will benefit from outpatient rheumatology referral.   DVT prophylaxis: Lovenox Code Status: Full code Family Communication: Discussed with significant other at the bedside Disposition Plan: Patient is from home and plan is to discharge home when she is medically stable, hopefully in the next 24  hours.  Discharge home once cleared by neurology/cardiology   Consultants:   Cardiology, Dr.End  Neurology, Dr. Doy Mince  Procedures:   Echo  Antimicrobials:       Subjective: Continues to have some occipital headache.  No changes in vision.  Overall dizziness during ambulation has improved.  She is not having palpitations at this time.  Objective: Vitals:   02/07/20 0000 02/07/20 0100 02/07/20 0630 02/07/20 0800  BP: 103/75 100/64  118/68  Pulse: (!) 102 89 91   Resp: 18 18 19 15   Temp:      TempSrc:      SpO2: 99% 99% 100% 100%  Weight:      Height:       No intake or output data in the 24 hours ending 02/07/20 1036 Filed Weights   02/06/20 1656  Weight: 81.6 kg    Examination:  General exam: Appears calm and comfortable  Respiratory system: Clear to auscultation. Respiratory effort normal. Cardiovascular system: S1 & S2 heard, RRR. No JVD, murmurs, rubs, gallops or clicks. No pedal edema. Gastrointestinal system: Abdomen is nondistended, soft and nontender. No organomegaly or masses felt. Normal bowel sounds heard. Central nervous system: Alert and oriented. No focal neurological deficits. Extremities: Symmetric 5 x 5 power. Skin: No rashes, lesions or ulcers Psychiatry: Judgement and insight appear normal. Mood & affect appropriate.     Data Reviewed: I have personally reviewed following labs and imaging studies  CBC: Recent Labs  Lab 02/06/20 1710  WBC 10.0  NEUTROABS 6.0  HGB  10.4*  HCT 35.2*  MCV 82.1  PLT [id number]   Basic Metabolic Panel: Recent Labs  Lab 02/06/20 1710  NA 135  K 4.8  CL 104  CO2 19*  GLUCOSE 107*  BUN 9  CREATININE 0.86  CALCIUM 8.7*  MG 2.0   GFR: Estimated Creatinine Clearance: 87.5 mL/min (by C-G formula based on SCr of 0.86 mg/dL). Liver Function Tests: No results for input(s): AST, ALT, ALKPHOS, BILITOT, PROT, ALBUMIN in the last 168 hours. No results for input(s): LIPASE, AMYLASE in the last 168 hours. No  results for input(s): AMMONIA in the last 168 hours. Coagulation Profile: No results for input(s): INR, PROTIME in the last 168 hours. Cardiac Enzymes: No results for input(s): CKTOTAL, CKMB, CKMBINDEX, TROPONINI in the last 168 hours. BNP (last 3 results) No results for input(s): PROBNP in the last 8760 hours. HbA1C: No results for input(s): HGBA1C in the last 72 hours. CBG: Recent Labs  Lab 02/07/20 0534 02/07/20 0916  GLUCAP 89 78   Lipid Profile: No results for input(s): CHOL, HDL, LDLCALC, TRIG, CHOLHDL, LDLDIRECT in the last 72 hours. Thyroid Function Tests: Recent Labs    02/06/20 1710  TSH 1.378   Anemia Panel: No results for input(s): VITAMINB12, FOLATE, FERRITIN, TIBC, IRON, RETICCTPCT in the last 72 hours. Sepsis Labs: No results for input(s): PROCALCITON, LATICACIDVEN in the last 168 hours.  Recent Results (from the past 240 hour(s))  SARS CORONAVIRUS 2 (TAT 6-24 HRS) Nasopharyngeal Nasopharyngeal Swab     Status: None   Collection Time: 02/06/20 10:09 PM   Specimen: Nasopharyngeal Swab  Result Value Ref Range Status   SARS Coronavirus 2 NEGATIVE NEGATIVE Final    Comment: (NOTE) SARS-CoV-2 target nucleic acids are NOT DETECTED. The SARS-CoV-2 RNA is generally detectable in upper and lower respiratory specimens during the acute phase of infection. Negative results do not preclude SARS-CoV-2 infection, do not rule out co-infections with other pathogens, and should not be used as the sole basis for treatment or other patient management decisions. Negative results must be combined with clinical observations, patient history, and epidemiological information. The expected result is Negative. Fact Sheet for Patients: SugarRoll.be Fact Sheet for Healthcare Providers: https://www.woods-mathews.com/ This test is not yet approved or cleared by the [place] FDA and  has been authorized for detection and/or diagnosis of  SARS-CoV-2 by FDA under an Emergency Use Authorization (EUA). This EUA will remain  in effect (meaning this test can be used) for the duration of the COVID-19 declaration under Section 56 4(b)(1) of the Act, 21 U.S.C. section 360bbb-3(b)(1), unless the authorization is terminated or revoked sooner. Performed at [facility], [place] [address] [place], [place] [address]          Radiology Studies: CT Head Wo Contrast  Result Date: 02/06/2020 CLINICAL DATA:  Severe headache, tachycardia, syncope EXAM: CT HEAD WITHOUT CONTRAST TECHNIQUE: Contiguous axial images were obtained from the base of the skull through the vertex without intravenous contrast. COMPARISON:  12/18/2019 FINDINGS: Brain: No evidence of acute infarction, hemorrhage, hydrocephalus, extra-axial collection or mass lesion/mass effect. Vascular: No hyperdense vessel or unexpected calcification. Skull: Normal. Negative for fracture or focal lesion. Sinuses/Orbits: The visualized paranasal sinuses are essentially clear. The mastoid air cells are unopacified. Other: None. IMPRESSION: Normal head CT. Electronically Signed   By: Julian Hy M.D.   On: 02/06/2020 17:54   CT Angio Chest PE W and/or Wo Contrast  Result Date: 02/06/2020 CLINICAL DATA:  Shortness of breath. EXAM: CT ANGIOGRAPHY CHEST  WITH CONTRAST TECHNIQUE: Multidetector CT imaging of the chest was performed using the standard protocol during bolus administration of intravenous contrast. Multiplanar CT image reconstructions and MIPs were obtained to evaluate the vascular anatomy. CONTRAST:  36mL OMNIPAQUE IOHEXOL 350 MG/ML SOLN COMPARISON:  July 03, 2014 FINDINGS: Cardiovascular: Satisfactory opacification of the pulmonary arteries to the segmental level. No evidence of pulmonary embolism. Normal heart size. No pericardial effusion. Mediastinum/Nodes: No enlarged mediastinal, hilar, or axillary lymph nodes. Thyroid gland, trachea, and esophagus demonstrate no  significant findings. Lungs/Pleura: A stable 5 mm noncalcified lung nodule is seen within the anterolateral aspect of the right lower lobe (axial CT image 36, CT series number 6). There is no evidence of acute infiltrate, pleural effusion or pneumothorax. Upper Abdomen: No acute abnormality. Musculoskeletal: No chest wall abnormality. No acute or significant osseous findings. Review of the MIP images confirms the above findings. IMPRESSION: 1. No CT evidence of pulmonary embolism or other acute intrathoracic pathology. 2. Stable 5 mm noncalcified right lower lobe lung nodule. Electronically Signed   By: Virgina Norfolk M.D.   On: 02/06/2020 18:51        Scheduled Meds: . buPROPion  150 mg Oral Daily  . enoxaparin (LOVENOX) injection  40 mg Subcutaneous Q24H  . loratadine  10 mg Oral Daily  . nortriptyline  10 mg Oral QHS  . sodium chloride flush  3 mL Intravenous Q12H  . traZODone  50 mg Oral Q1500  . zonisamide  100 mg Oral QHS   Continuous Infusions: . sodium chloride 75 mL/hr at 02/07/20 0816     LOS: 0 days    Time spent: 35 mins    Kathie Dike, MD [facility]   If 7PM-7AM, please contact night-coverage [email or web address]  02/07/2020, 10:36 AM

## 2020-02-07 NOTE — Progress Notes (Signed)
eeg completed ° °

## 2020-02-07 NOTE — Consult Note (Signed)
Cardiology Consultation:   Patient ID: Cindy Hays; [id number]; 1981/05/26   Admit date: 02/06/2020 Date of Consult: 02/07/2020  Primary Care Provider: Ward, Honor Loh, MD Primary Cardiologist: New to [facility] - consult by End   Patient Profile:   Cindy Hays is a 39 y.o. female with a hx of chronic intractable headache disorder including occipital neuralgia status post nerve blocks most recently 4 days prior, ANA positive status without diagnosis of autoimmune disorder, anemia requiring blood transfusions in the past secondary menorrhagia, HTN, and obesity who is being seen today for the evaluation of sinus tachycardia at the request of Dr. Roderic Palau.  History of Present Illness:   Cindy Hays's reports a history of elevated heart rates with prior reported Holter ~ 5 years ago being unrevealing. She indicates her PCP wanted her to see a cardiologist for this again as recently as 5 months ago, though with the [place] pandemic this was deferred. She was diagnosed with intractable headaches in 12/2019 and has been followed by neurology. With this, she has been placed on several new medications including nortriptyline and bupropion. She did not tolerate Topamax secondary to mental confusion and has been placed on Zonegran in its place. Her headaches have kept her out of work. She received occipital nerve blocks on 3/19 and was pain free from 3/20-3/21. She works from home. During a break from work, she stood up and walked to the doorway. With this, she became flushed and noted tachypalpitations. She then suffered a witnessed syncopal episode (her daughter was right there with her). Patient indicates her daughter has told her she was out for 1-2 minutes. Then next thing she remembers is waking up on her bed with EMS present. Upon her arrival to the ED, she again had several other brief syncopal episodes, though it does not appear she was on telemetry with these events. Notes indicate they were several  seconds in duration. Each of these episodes has been preceded by flushing and tachypalpitations. There has been no mention of post ictal state following these episodes.  It is unclear if she was on telemetry during all of these episodes.  Case was discussed with neurology who did not feel her syncopal episodes were related to her recent nerve block.  She continued to report severe headache different than her baseline.  D-dimer was elevated at 542.  CTA chest was negative for PE or other acute disease.  CT head was nonacute.  High-sensitivity troponin negative x2.  Hemoglobin 10.4 which appears to be around her approximate baseline.  TSH was normal.  She was noted to be in sinus tachycardia with rates ranging from the low 100s to 130s.  Neurology has been consulted.  Echo showed hyperdynamic LVSF as below.  She did have a 17-beat run of NSVT this morning in the ED and was asymptomatic with this.  Currently, she continues to note tachycardic rates into the 120s on her bedside monitor with activities such as talking. Never with chest pain. There is no family history of sudden cardiac death. She does report 2 prior episodes of syncope in her life, both were associated with pregnancy. Her HCG is negative in the ED.     Past Medical History:  Diagnosis Date  . Anemia   . Hypertension   . Preterm labor     Past Surgical History:  Procedure Laterality Date  . NO PAST SURGERIES       Home Meds: Prior to Admission medications   Medication Sig Start  Date End Date Taking? Authorizing Provider  albuterol (VENTOLIN HFA) 108 (90 Base) MCG/ACT inhaler Inhale 1 puff into the lungs every 6 (six) hours as needed for wheezing or shortness of breath.   Yes [provider]  buPROPion (WELLBUTRIN XL) 150 MG 24 hr tablet Take 150 mg by mouth daily. 12/21/19  Yes [provider]  ferrous sulfate 220 (44 Fe) MG/5ML solution Take 220 mg by mouth 2 (two) times daily with a meal.  11/16/19  Yes [provider]  loratadine (CLARITIN) 10 MG tablet Take 10 mg by mouth daily. 02/03/20  Yes [provider]  medroxyPROGESTERone (DEPO-PROVERA) 150 MG/ML injection Inject 150 mg into the muscle every 3 (three) months.   Yes [provider]  Multiple Vitamin (MULTI-VITAMIN DAILY PO) Take 1 tablet by mouth daily in the afternoon.    Yes [provider]  nortriptyline (PAMELOR) 10 MG capsule TAKE 1 CAPSULE AT BEDTIME FOR 1 WEEK THEN INCREASE TO 2 CAPS AT BEDTIME 12/30/19  Yes [provider]  traZODone (DESYREL) 50 MG tablet Take 1 tablet by mouth daily in the afternoon. 02/03/20  Yes [provider]  Vitamin D, Ergocalciferol, (DRISDOL) 1.25 MG (50000 UNIT) CAPS capsule Take 50,000 Units by mouth once a week. 01/31/20  Yes [provider]  zonisamide (ZONEGRAN) 100 MG capsule Take 100 mg by mouth at bedtime. 02/02/20  Yes [provider]  butalbital-acetaminophen-caffeine (FIORICET) 50-325-40 MG tablet Take 1 tablet by mouth every 6 (six) hours as needed for headache. Not take more than 6 tablets in  24-hours Patient not taking: Reported on 02/06/2020 01/14/20 01/13/21  Lorin Picket, PA-C  prochlorperazine (COMPAZINE) 10 MG tablet Take 1 tablet (10 mg total) by mouth every 6 (six) hours as needed for nausea or vomiting. Patient not taking: Reported on 02/06/2020 12/18/19   Blake Divine, MD  [name] 150-35 MCG/24HR transdermal patch 1 patch once a week. 09/04/19 11/18/19  [provider]    Inpatient Medications: Scheduled Meds: . buPROPion  150 mg Oral Daily  . enoxaparin (LOVENOX) injection  40 mg Subcutaneous Q24H  . loratadine  10 mg Oral Daily  . nortriptyline  10 mg Oral QHS  . sodium chloride flush  3 mL Intravenous Q12H  . traZODone  50 mg Oral Q1500  . zonisamide  100 mg Oral QHS   Continuous Infusions: . sodium chloride 75 mL/hr at 02/07/20 0816   PRN Meds: acetaminophen **OR** acetaminophen, alum & mag  hydroxide-simeth, ondansetron **OR** ondansetron (ZOFRAN) IV, senna-docusate  Allergies:   Allergies  Allergen Reactions  . Vicodin [Hydrocodone-Acetaminophen] Rash    Social History:   Social History   Socioeconomic History  . Marital status: Single    Spouse name: Not on file  . Number of children: Not on file  . Years of education: Not on file  . Highest education level: Not on file  Occupational History  . Not on file  Tobacco Use  . Smoking status: Never Smoker  . Smokeless tobacco: Never Used  Substance and Sexual Activity  . Alcohol use: Not Currently  . Drug use: No  . Sexual activity: Not Currently    Birth control/protection: Pill  Other Topics Concern  . Not on file  Social History Narrative  . Not on file   Social Determinants of Health   Financial Resource Strain:   . Difficulty of Paying Living Expenses:   Food Insecurity:   . Worried About Charity fundraiser in the Last Year:   .  Ran Out of Food in the Last Year:   Transportation Needs:   . Film/video editor (Medical):   Marland Kitchen Lack of Transportation (Non-Medical):   Physical Activity:   . Days of Exercise per Week:   . Minutes of Exercise per Session:   Stress:   . Feeling of Stress :   Social Connections:   . Frequency of Communication with Friends and Family:   . Frequency of Social Gatherings with Friends and Family:   . Attends Religious Services:   . Active Member of Clubs or Organizations:   . Attends Archivist Meetings:   Marland Kitchen Marital Status:   Intimate Partner Violence:   . Fear of Current or Ex-Partner:   . Emotionally Abused:   Marland Kitchen Physically Abused:   . Sexually Abused:      Family History:   Family History  Problem Relation Age of Onset  . Hypertension Mother   . Alcohol abuse Mother   . Liver disease Mother   . Hypertension Maternal Grandmother   . Diabetes Maternal Grandmother   . Dementia Maternal Grandmother   . Autoimmune disease Daughter     ROS:    Review of Systems  Constitutional: Positive for malaise/fatigue. Negative for chills, diaphoresis, fever and weight loss.  HENT: Negative for congestion.   Eyes: Negative for discharge and redness.  Respiratory: Negative for cough, sputum production, shortness of breath and wheezing.   Cardiovascular: Negative for chest pain, palpitations, orthopnea, claudication, leg swelling and PND.  Gastrointestinal: Negative for abdominal pain, heartburn, nausea and vomiting.  Musculoskeletal: Negative for falls and myalgias.  Skin: Negative for rash.  Neurological: Positive for loss of consciousness, weakness and headaches. Negative for dizziness, tingling, tremors, sensory change, speech change, focal weakness and seizures.  Endo/Heme/Allergies: Does not bruise/bleed easily.  Psychiatric/Behavioral: Negative for substance abuse. The patient is not nervous/anxious.   All other systems reviewed and are negative.     Physical Exam/Data:   Vitals:   02/07/20 0000 02/07/20 0100 02/07/20 0630 02/07/20 0800  BP: 103/75 100/64  118/68  Pulse: (!) 102 89 91   Resp: 18 18 19 15   Temp:      TempSrc:      SpO2: 99% 99% 100% 100%  Weight:      Height:       No intake or output data in the 24 hours ending 02/07/20 1039 Filed Weights   02/06/20 1656  Weight: 81.6 kg   Body mass index is 34.01 kg/m.   Physical Exam: General: Well developed, well nourished, in no acute distress. Head: Normocephalic, atraumatic, sclera non-icteric, no xanthomas, nares without discharge.  Neck: Negative for carotid bruits. JVD not elevated. Lungs: Clear bilaterally to auscultation without wheezes, rales, or rhonchi. Breathing is unlabored. Heart: Mildly tachycardic with S1 S2. No murmurs, rubs, or gallops appreciated. Abdomen: Soft, non-tender, non-distended with normoactive bowel sounds. No hepatomegaly. No rebound/guarding. No obvious abdominal masses. Msk:  Strength and tone appear normal for age. Extremities: No  clubbing or cyanosis. No edema. Distal pedal pulses are 2+ and equal bilaterally. Neuro: Alert and oriented X 3. No facial asymmetry. No focal deficit. Moves all extremities spontaneously. Psych:  Responds to questions appropriately with a normal affect.   EKG:  The EKG was personally reviewed and demonstrates: sinus tachycardia, 133 bpm, no acute st/t changes Telemetry:  Telemetry was personally reviewed and demonstrates: predominant rhythm of sinus tachycardia with rates ranging from the low 100s to 130s bpm, brief episodes of SR, occasional PVCs,  17 beat run of NSVT at 11:29 AM  Weights: Filed Weights   02/06/20 1656  Weight: 81.6 kg    Relevant CV Studies: 2D echo 02/07/2020: 1. Left ventricular ejection fraction, by estimation, is 70 to 75%. The  left ventricle has hyperdynamic function. The left ventricle has no  regional wall motion abnormalities. There is mild left ventricular  hypertrophy. Left ventricular diastolic parameters were normal.  2. Right ventricular systolic function is normal. The right ventricular  size is normal. There is normal pulmonary artery systolic pressure.  3. The mitral valve is normal in structure. No evidence of mitral valve  regurgitation. No evidence of mitral stenosis.  4. The aortic valve was not well visualized. Aortic valve regurgitation  is not visualized. No aortic stenosis is present.   Laboratory Data:  Chemistry Recent Labs  Lab 02/06/20 1710  NA 135  K 4.8  CL 104  CO2 19*  GLUCOSE 107*  BUN 9  CREATININE 0.86  CALCIUM 8.7*  GFRNONAA >60  GFRAA >60  ANIONGAP 12    No results for input(s): PROT, ALBUMIN, AST, ALT, ALKPHOS, BILITOT in the last 168 hours. Hematology Recent Labs  Lab 02/06/20 1710  WBC 10.0  RBC 4.29  HGB 10.4*  HCT 35.2*  MCV 82.1  MCH 24.2*  MCHC 29.5*  RDW 17.2*  PLT 396   Cardiac EnzymesNo results for input(s): TROPONINI in the last 168 hours. No results for input(s): TROPIPOC in the last 168  hours.  BNPNo results for input(s): BNP, PROBNP in the last 168 hours.  DDimer No results for input(s): DDIMER in the last 168 hours.  Radiology/Studies:  CT Head Wo Contrast  Result Date: 02/06/2020 IMPRESSION: Normal head CT. Electronically Signed   By: Julian Hy M.D.   On: 02/06/2020 17:54   CT Angio Chest PE W and/or Wo Contrast  Result Date: 02/06/2020 IMPRESSION: 1. No CT evidence of pulmonary embolism or other acute intrathoracic pathology. 2. Stable 5 mm noncalcified right lower lobe lung nodule. Electronically Signed   By: Virgina Norfolk M.D.   On: 02/06/2020 18:51    Assessment and Plan:   1. Sinus tachycardia: -She reports a long history of elevated heart rates with reported prior Holter ~ 5 years ago being unrevealing -Her tachycardic rates are now likely exacerbated by and likely multifactorial including medications nortriptyline and bupropion, pain from intractable headache disorder, anemia, obesity, and physical deconditioning  -CTA chest negative for PE -TSH normal -Consider phaeochromocytoma given constellation of symptoms, will order plasma metanephrines  -Monitor on telemetry  -Consider outpatient cardiac monitoring if inpatient tele is unrevealing (given syncope) -Minimize caffeine intake -UDS noted   2. NSVT: -HS-Tn negative  -It appears she was asymptomatic during this episode captured in the ED -Cannot exclude potential ventricular-ectopy mediated syncope -Monitor on telemetry as above -Echo with hyperdynamic LVSF with only mild LVH -Outpatient cMRI  3. Syncope: -Of uncertain etiology at this time -Monitor on telemetry for significant arrhythmia, high grade AV block, or prolonged pause -If telemetry is unrevealing, plan for outpatient cardiac monitoring -Echo as above -cMRI as an outpatient  -Check orthostatics, though she has received IV fluids -No driving x 6 months  4. Intractable headache: -Neurology has been consulted with plans for  MRI of the brain, MRA/MRV of the head, LP and EEG -Per IM -Pain is likely playing a role in the above   For questions or updates, please contact [facility] Please consult [email or web address] for contact info under Cardiology/STEMI.  Signed, Christell Faith, PA-C [facility] Pager: [phone] 02/07/2020, 10:39 AM

## 2020-02-07 NOTE — Procedures (Signed)
ELECTROENCEPHALOGRAM REPORT   Patient: Cindy Hays       Room #: 137A-AA EEG No. ID: [id number] Age: 39 y.o.        Sex: female Requesting Physician: Memon Report Date:  02/07/2020        Interpreting Physician: Alexis Goodell  History: [name] is an 39 y.o. female with recurrent syncope  Medications:  Pamelor, Desyrel, Zonegran  Conditions of Recording:  This is a 21 channel routine scalp EEG performed with bipolar and monopolar montages arranged in accordance to the international 10/20 system of electrode placement. One channel was dedicated to EKG recording.  The patient is in the awake and drowsy states.  Description:  The waking background activity consists of a low voltage, symmetrical, fairly well organized, 10 Hz alpha activity, seen from the parieto-occipital and posterior temporal regions.  Low voltage fast activity, poorly organized, is seen anteriorly and is at times superimposed on more posterior regions.  A mixture of theta and alpha rhythms are seen from the central and temporal regions. The patient drowses with slowing to irregular, low voltage theta and beta activity.   Stage II sleep is not obtained. No epileptiform activity is noted.   Hyperventilation was not performed.  Intermittent photic stimulation was performed but failed to illicit any change in the tracing.   IMPRESSION: Normal electroencephalogram, awake, drowsy and with activation procedures. There are no focal lateralizing or epileptiform features.   Alexis Goodell, MD Neurology [phone] 02/07/2020, 6:02 PM

## 2020-02-08 ENCOUNTER — Ambulatory Visit: Payer: Self-pay

## 2020-02-08 DIAGNOSIS — I472 Ventricular tachycardia: Secondary | ICD-10-CM | POA: Diagnosis not present

## 2020-02-08 DIAGNOSIS — I4729 Other ventricular tachycardia: Secondary | ICD-10-CM

## 2020-02-08 DIAGNOSIS — R000 Tachycardia, unspecified: Secondary | ICD-10-CM | POA: Diagnosis not present

## 2020-02-08 DIAGNOSIS — R55 Syncope and collapse: Secondary | ICD-10-CM | POA: Diagnosis not present

## 2020-02-08 DIAGNOSIS — M5481 Occipital neuralgia: Secondary | ICD-10-CM | POA: Diagnosis not present

## 2020-02-08 LAB — GLUCOSE, CAPILLARY
Glucose-Capillary: 89 mg/dL (ref 70–99)
Glucose-Capillary: 83 mg/dL (ref 70–99)

## 2020-02-08 NOTE — Discharge Instructions (Signed)
Patient advised not to drive for six months for now. She needs cardiac workup as outpatient as well

## 2020-02-08 NOTE — Progress Notes (Signed)
Progress Note  Patient Name: Cindy Hays Date of Encounter: 02/08/2020  Primary Cardiologist: New - Dr. Saunders Revel  Subjective   Patient doing okay. Denies chest pain or shortness of breath. Had an occasional episode of dizziness when going to the bathroom.  Inpatient Medications    Scheduled Meds: . buPROPion  150 mg Oral Daily  . loratadine  10 mg Oral Daily  . nortriptyline  10 mg Oral QHS  . sodium chloride flush  3 mL Intravenous Q12H  . traZODone  50 mg Oral QHS  . zonisamide  100 mg Oral QHS   Continuous Infusions:  PRN Meds: acetaminophen **OR** acetaminophen, alum & mag hydroxide-simeth, hydrOXYzine, ondansetron **OR** ondansetron (ZOFRAN) IV, senna-docusate   Vital Signs    Vitals:   02/08/20 0439 02/08/20 0500 02/08/20 0734 02/08/20 1151  BP: 112/76  117/74 121/77  Pulse: 83  72 84  Resp: 18     Temp: 97.8 F (36.6 C)  98.2 F (36.8 C) 98.7 F (37.1 C)  TempSrc: Oral  Oral Oral  SpO2: 100%  100% 100%  Weight:  88.2 kg    Height:        Intake/Output Summary (Last 24 hours) at 02/08/2020 1225 Last data filed at 02/08/2020 1032 Gross per 24 hour  Intake 1321.93 ml  Output --  Net 1321.93 ml   Last 3 Weights 02/08/2020 02/06/2020 01/14/2020  Weight (lbs) 194 lb 7.1 oz 180 lb 198 lb  Weight (kg) 88.2 kg 81.647 kg 89.812 kg  Some encounter information is confidential and restricted. Go to Review Flowsheets activity to see all data.      Telemetry    sinus rhythm, heart rate 99- Personally Reviewed  ECG    no new ECG obtained- Personally Reviewed  Physical Exam   GEN: No acute distress.   Neck: No JVD Cardiac: RRR, no murmurs, rubs, or gallops.  Respiratory: Clear to auscultation bilaterally. GI: Soft, nontender, non-distended  MS: No edema; No deformity. Neuro:  Nonfocal  Psych: Normal affect   Labs    High Sensitivity Troponin:   Recent Labs  Lab 02/06/20 1710 02/06/20 1915  TROPONINIHS <2 <2      Chemistry Recent Labs  Lab  02/06/20 1710  NA 135  K 4.8  CL 104  CO2 19*  GLUCOSE 107*  BUN 9  CREATININE 0.86  CALCIUM 8.7*  GFRNONAA >60  GFRAA >60  ANIONGAP 12     Hematology Recent Labs  Lab 02/06/20 1710  WBC 10.0  RBC 4.29  HGB 10.4*  HCT 35.2*  MCV 82.1  MCH 24.2*  MCHC 29.5*  RDW 17.2*  PLT 396    BNPNo results for input(s): BNP, PROBNP in the last 168 hours.   DDimer No results for input(s): DDIMER in the last 168 hours.   Radiology    EEG  Result Date: 02/07/2020 Alexis Goodell, MD     02/07/2020  6:07 PM ELECTROENCEPHALOGRAM REPORT Patient: Cindy Hays       Room #: 137A-AA EEG No. ID: [id number] Age: 39 y.o.        Sex: female Requesting Physician: Memon Report Date:  02/07/2020       Interpreting Physician: Alexis Goodell History: Cailani Adamik is an 39 y.o. female with recurrent syncope Medications: Pamelor, Desyrel, Zonegran Conditions of Recording:  This is a 21 channel routine scalp EEG performed with bipolar and monopolar montages arranged in accordance to the international 10/20 system of electrode placement. One channel was dedicated to EKG recording.  The patient is in the awake and drowsy states. Description:  The waking background activity consists of a low voltage, symmetrical, fairly well organized, 10 Hz alpha activity, seen from the parieto-occipital and posterior temporal regions.  Low voltage fast activity, poorly organized, is seen anteriorly and is at times superimposed on more posterior regions.  A mixture of theta and alpha rhythms are seen from the central and temporal regions. The patient drowses with slowing to irregular, low voltage theta and beta activity.  Stage II sleep is not obtained. No epileptiform activity is noted.  Hyperventilation was not performed.  Intermittent photic stimulation was performed but failed to illicit any change in the tracing. IMPRESSION: Normal electroencephalogram, awake, drowsy and with activation procedures. There are no focal  lateralizing or epileptiform features. Alexis Goodell, MD Neurology [phone] 02/07/2020, 6:02 PM   CT Head Wo Contrast  Result Date: 02/06/2020 CLINICAL DATA:  Severe headache, tachycardia, syncope EXAM: CT HEAD WITHOUT CONTRAST TECHNIQUE: Contiguous axial images were obtained from the base of the skull through the vertex without intravenous contrast. COMPARISON:  12/18/2019 FINDINGS: Brain: No evidence of acute infarction, hemorrhage, hydrocephalus, extra-axial collection or mass lesion/mass effect. Vascular: No hyperdense vessel or unexpected calcification. Skull: Normal. Negative for fracture or focal lesion. Sinuses/Orbits: The visualized paranasal sinuses are essentially clear. The mastoid air cells are unopacified. Other: None. IMPRESSION: Normal head CT. Electronically Signed   By: Julian Hy M.D.   On: 02/06/2020 17:54   CT Angio Chest PE W and/or Wo Contrast  Result Date: 02/06/2020 CLINICAL DATA:  Shortness of breath. EXAM: CT ANGIOGRAPHY CHEST WITH CONTRAST TECHNIQUE: Multidetector CT imaging of the chest was performed using the standard protocol during bolus administration of intravenous contrast. Multiplanar CT image reconstructions and MIPs were obtained to evaluate the vascular anatomy. CONTRAST:  33mL OMNIPAQUE IOHEXOL 350 MG/ML SOLN COMPARISON:  July 03, 2014 FINDINGS: Cardiovascular: Satisfactory opacification of the pulmonary arteries to the segmental level. No evidence of pulmonary embolism. Normal heart size. No pericardial effusion. Mediastinum/Nodes: No enlarged mediastinal, hilar, or axillary lymph nodes. Thyroid gland, trachea, and esophagus demonstrate no significant findings. Lungs/Pleura: A stable 5 mm noncalcified lung nodule is seen within the anterolateral aspect of the right lower lobe (axial CT image 36, CT series number 6). There is no evidence of acute infiltrate, pleural effusion or pneumothorax. Upper Abdomen: No acute abnormality. Musculoskeletal: No chest  wall abnormality. No acute or significant osseous findings. Review of the MIP images confirms the above findings. IMPRESSION: 1. No CT evidence of pulmonary embolism or other acute intrathoracic pathology. 2. Stable 5 mm noncalcified right lower lobe lung nodule. Electronically Signed   By: Virgina Norfolk M.D.   On: 02/06/2020 18:51   MR ANGIO HEAD WO CONTRAST  Result Date: 02/07/2020 CLINICAL DATA:  Headache and syncope EXAM: MRA HEAD WITHOUT CONTRAST TECHNIQUE: Angiographic images of the Circle of Willis were obtained using MRA technique without intravenous contrast. COMPARISON:  None. FINDINGS: Both vertebral arteries patent to the basilar. Left PICA patent. Right PICA not visualized. AICA patent bilaterally. Basilar widely patent. Superior cerebellar and posterior cerebral arteries patent bilaterally. Internal carotid artery white the patent bilaterally. Anterior and middle cerebral arteries widely patent bilaterally without stenosis Negative for aneurysm or vascular malformation. IMPRESSION: Negative MRA head Electronically Signed   By: Franchot Gallo M.D.   On: 02/07/2020 15:14   MR BRAIN WO CONTRAST  Result Date: 02/07/2020 CLINICAL DATA:  Headache and syncope EXAM: MRI HEAD WITHOUT CONTRAST TECHNIQUE: Multiplanar, multiecho pulse sequences of  the brain and surrounding structures were obtained without intravenous contrast. COMPARISON:  CT head 02/06/2020 FINDINGS: Brain: No acute infarction, hemorrhage, hydrocephalus, extra-axial collection or mass lesion. Normal white matter. Vascular: Normal arterial flow voids Skull and upper cervical spine: No focal skeletal abnormality. Sinuses/Orbits: Negative Other: None IMPRESSION: Negative MRI head Electronically Signed   By: Franchot Gallo M.D.   On: 02/07/2020 15:12   MR MRV HEAD W WO CONTRAST  Result Date: 02/07/2020 CLINICAL DATA:  Syncope and headache EXAM: MR VENOGRAM HEAD WITHOUT AND WITH CONTRAST CONTRAST:  8 mL Gadovist IV TECHNIQUE:  Angiographic images of the intracranial venous structures were obtained using MRV technique without and with intravenous contrast. COMPARISON:  None. FINDINGS: Dural sinuses are widely patent and normal bilaterally. No venous sinus thrombosis or occlusion. No significant stenosis. No vascular malformation. IMPRESSION: Negative MRV head Electronically Signed   By: Franchot Gallo M.D.   On: 02/07/2020 15:17   ECHOCARDIOGRAM COMPLETE  Result Date: 02/07/2020    ECHOCARDIOGRAM REPORT   Patient Name:   KHAMARIA Sylvestre Date of Exam: 02/07/2020 Medical Rec #:  [id number]       Height:       61.0 in Accession #:    [id number]      Weight:       180.0 lb Date of Birth:  November 11, 1981       BSA:          1.806 m Patient Age:    34 years        BP:           118/68 mmHg Patient Gender: F               HR:           94 bpm. Exam Location:  [facility] Procedure: Color Doppler and Cardiac Doppler Indications:     Syncope 780.2  History:         Patient has no prior history of Echocardiogram examinations.                  Risk Factors:Hypertension.  Sonographer:     Sherrie Sport RDCS (AE) Referring Phys:  [id number] Athena Masse Diagnosing Phys: Nelva Bush MD IMPRESSIONS  1. Left ventricular ejection fraction, by estimation, is 70 to 75%. The left ventricle has hyperdynamic function. The left ventricle has no regional wall motion abnormalities. There is mild left ventricular hypertrophy. Left ventricular diastolic parameters were normal.  2. Right ventricular systolic function is normal. The right ventricular size is normal. There is normal pulmonary artery systolic pressure.  3. The mitral valve is normal in structure. No evidence of mitral valve regurgitation. No evidence of mitral stenosis.  4. The aortic valve was not well visualized. Aortic valve regurgitation is not visualized. No aortic stenosis is present. FINDINGS  Left Ventricle: Left ventricular ejection fraction, by estimation, is 70 to 75%. The left ventricle has  hyperdynamic function. The left ventricle has no regional wall motion abnormalities. The left ventricular internal cavity size was normal in size. There is mild left ventricular hypertrophy. Left ventricular diastolic parameters were normal. Right Ventricle: The right ventricular size is normal. No increase in right ventricular wall thickness. Right ventricular systolic function is normal. There is normal pulmonary artery systolic pressure. Left Atrium: Left atrial size was normal in size. Right Atrium: Right atrial size was normal in size. Pericardium: The pericardium was not well visualized. Mitral Valve: The mitral valve is normal in structure. No evidence of mitral valve  regurgitation. No evidence of mitral valve stenosis. Tricuspid Valve: The tricuspid valve is not well visualized. Tricuspid valve regurgitation is trivial. Aortic Valve: The aortic valve was not well visualized. Aortic valve regurgitation is not visualized. No aortic stenosis is present. Aortic valve mean gradient measures 5.0 mmHg. Aortic valve peak gradient measures 7.4 mmHg. Aortic valve area, by VTI measures 2.64 cm. Pulmonic Valve: The pulmonic valve was not well visualized. Pulmonic valve regurgitation is not visualized. No evidence of pulmonic stenosis. Aorta: The aortic root is normal in size and structure. Pulmonary Artery: The pulmonary artery is not well seen. Venous: The inferior vena cava was not well visualized. IAS/Shunts: The interatrial septum was not well visualized.  LEFT VENTRICLE PLAX 2D LVIDd:         3.99 cm  Diastology LVIDs:         2.18 cm  LV e' lateral:   11.40 cm/s LV PW:         1.14 cm  LV E/e' lateral: 7.1 LV IVS:        1.02 cm  LV e' medial:    9.46 cm/s LVOT diam:     2.00 cm  LV E/e' medial:  8.6 LV SV:         63 LV SV Index:   35 LVOT Area:     3.14 cm  RIGHT VENTRICLE RV Basal diam:  3.50 cm RV S prime:     13.30 cm/s TAPSE (M-mode): 3.0 cm LEFT ATRIUM             Index       RIGHT ATRIUM           Index  LA diam:        2.60 cm 1.44 cm/m  RA Area:     14.40 cm LA Vol (A2C):   52.8 ml 29.24 ml/m RA Volume:   36.20 ml  20.04 ml/m LA Vol (A4C):   52.1 ml 28.85 ml/m LA Biplane Vol: 54.3 ml 30.07 ml/m  AORTIC VALVE                    PULMONIC VALVE AV Area (Vmax):    2.43 cm     PV Vmax:        0.60 m/s AV Area (Vmean):   2.22 cm     PV Peak grad:   1.4 mmHg AV Area (VTI):     2.64 cm     RVOT Peak grad: 5 mmHg AV Vmax:           136.00 cm/s AV Vmean:          107.000 cm/s AV VTI:            0.240 m AV Peak Grad:      7.4 mmHg AV Mean Grad:      5.0 mmHg LVOT Vmax:         105.00 cm/s LVOT Vmean:        75.700 cm/s LVOT VTI:          0.202 m LVOT/AV VTI ratio: 0.84  AORTA Ao Root diam: 2.60 cm MITRAL VALVE               TRICUSPID VALVE MV Area (PHT): 4.24 cm    TR Peak grad:   22.3 mmHg MV Decel Time: 179 msec    TR Vmax:        236.00 cm/s MV E velocity: 81.00 cm/s MV A velocity: 93.10 cm/s  SHUNTS MV E/A ratio:  0.87        Systemic VTI:  0.20 m                            Systemic Diam: 2.00 cm Nelva Bush MD Electronically signed by Nelva Bush MD Signature Date/Time: 02/07/2020/12:22:27 PM    Final     Cardiac Studies   2D echo 02/07/2020: 1. Left ventricular ejection fraction, by estimation, is 70 to 75%. The  left ventricle has hyperdynamic function. The left ventricle has no  regional wall motion abnormalities. There is mild left ventricular  hypertrophy. Left ventricular diastolic parameters were normal.  2. Right ventricular systolic function is normal. The right ventricular  size is normal. There is normal pulmonary artery systolic pressure.  3. The mitral valve is normal in structure. No evidence of mitral valve  regurgitation. No evidence of mitral stenosis.  4. The aortic valve was not well visualized. Aortic valve regurgitation  is not visualized. No aortic stenosis is present.   Patient Profile     39 y.o. 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 Patient had a 17 beat run of NSVT yesterday. She was asymptomatic.  Assessment & Plan    1. Syncope, NSVT -asymptomatic 17 beat run of NSVT on 02/07/2020 -no further episodes of arrhythmias on telemetry. Continue to monitor on telemetry while in-house. -echo with normal wall motion. -Recommend cardiac monitor upon discharge -Can consider CMR as outpatient to evaluate scar  2. Headaches -management as per primary and neurology.      Signed, Kate Sable, MD  02/08/2020, 12:25 PM

## 2020-02-08 NOTE — Progress Notes (Signed)
Patient planning discharge today but scheduled for LP tomorrow at 1100.  Called patient to discuss arrival at 1000 to Same day surgery for pre procedure workup.  Aware she will need driver and will need to stay up to 4 hours post procedure for monitoring. Labs ordered.

## 2020-02-08 NOTE — Progress Notes (Signed)
Subjective: headache resolved and pt seems back to her baseline.    Past Medical History:  Diagnosis Date  . Anemia   . Hypertension   . Preterm labor     Past Surgical History:  Procedure Laterality Date  . NO PAST SURGERIES      Family History  Problem Relation Age of Onset  . Hypertension Mother   . Alcohol abuse Mother   . Liver disease Mother   . Hypertension Maternal Grandmother   . Diabetes Maternal Grandmother   . Dementia Maternal Grandmother   . Autoimmune disease Daughter     Social History:  reports that she has never smoked. She has never used smokeless tobacco. She reports previous alcohol use. She reports that she does not use drugs.  Allergies  Allergen Reactions  . Vicodin [Hydrocodone-Acetaminophen] Rash    Medications: I have reviewed the patient's current medications. Prior to Admission medications   Medication Sig Start Date End Date Taking? Authorizing Provider  albuterol (VENTOLIN HFA) 108 (90 Base) MCG/ACT inhaler Inhale 1 puff into the lungs every 6 (six) hours as needed for wheezing or shortness of breath.   Yes [provider]         ferrous sulfate 220 (44 Fe) MG/5ML solution Take 220 mg by mouth 2 (two) times daily with a meal.  11/16/19  Yes [provider]  loratadine (CLARITIN) 10 MG tablet Take 10 mg by mouth daily. 02/03/20  Yes [provider]  medroxyPROGESTERone (DEPO-PROVERA) 150 MG/ML injection Inject 150 mg into the muscle every 3 (three) months.   Yes [provider]  Multiple Vitamin (MULTI-VITAMIN DAILY PO) Take 1 tablet by mouth daily in the afternoon.    Yes [provider]  nortriptyline (PAMELOR) 10 MG capsule TAKE 1 CAPSULE AT BEDTIME FOR 1 WEEK THEN INCREASE TO 2 CAPS AT BEDTIME 12/30/19  Yes [provider]  traZODone (DESYREL) 50 MG tablet Take 1 tablet by mouth daily in the afternoon. 02/03/20  Yes [provider]  Vitamin D, Ergocalciferol, (DRISDOL) 1.25 MG  (50000 UNIT) CAPS capsule Take 50,000 Units by mouth once a week. 01/31/20  Yes [provider]  zonisamide (ZONEGRAN) 100 MG capsule Take 100 mg by mouth at bedtime. 02/02/20  Yes [provider]  butalbital-acetaminophen-caffeine (FIORICET) 50-325-40 MG tablet Take 1 tablet by mouth every 6 (six) hours as needed for headache. Not take more than 6 tablets in  24-hours Patient not taking: Reported on 02/06/2020 01/14/20 01/13/21  Lorin Picket, PA-C  prochlorperazine (COMPAZINE) 10 MG tablet Take 1 tablet (10 mg total) by mouth every 6 (six) hours as needed for nausea or vomiting. Patient not taking: Reported on 02/06/2020 12/18/19   Blake Divine, MD  [name] 150-35 MCG/24HR transdermal patch 1 patch once a week. 09/04/19 11/18/19  [provider]    ROS: History obtained from the patient  General ROS: negative for - chills, fatigue, fever, night sweats, weight gain or weight loss Psychological ROS: negative for - behavioral disorder, hallucinations, memory difficulties, mood swings or suicidal ideation Ophthalmic ROS: intermittent blurry vision ENT ROS: negative for - epistaxis, nasal discharge, oral lesions, sore throat, tinnitus or vertigo Allergy and Immunology ROS: negative for - hives or itchy/watery eyes Hematological and Lymphatic ROS: negative for - bleeding problems, bruising or swollen lymph nodes Endocrine ROS: negative for - galactorrhea, hair pattern changes, polydipsia/polyuria or temperature intolerance Respiratory ROS: negative for - cough, hemoptysis, shortness of breath or wheezing Cardiovascular ROS: negative for - chest  pain, dyspnea on exertion, edema or irregular heartbeat Gastrointestinal ROS: negative for - abdominal pain, diarrhea, hematemesis, nausea/vomiting or stool incontinence Genito-Urinary ROS: negative for - dysuria, hematuria, incontinence or urinary frequency/urgency Musculoskeletal ROS: negative for - joint swelling or muscular  weakness Neurological ROS: as noted in HPI Dermatological ROS: negative for rash and skin lesion changes  Physical Examination: Blood pressure 121/77, pulse 84, temperature 98.7 F (37.1 C), temperature source Oral, resp. rate 18, height 5\' 1"  (1.549 m), weight 88.2 kg, last menstrual period 01/09/2020, SpO2 100 %, not currently breastfeeding.  HEENT-  Normocephalic, no lesions, without obvious abnormality.  Normal external eye and conjunctiva.  Normal TM's bilaterally.  Normal auditory canals and external ears. Normal external nose, mucus membranes and septum.  Normal pharynx. Cardiovascular- S1, S2 normal, pulses palpable throughout   Lungs- chest clear, no wheezing, rales, normal symmetric air entry Abdomen- soft, non-tender; bowel sounds normal; no masses,  no organomegaly Extremities- no edema Lymph-no adenopathy palpable Musculoskeletal-no joint tenderness, deformity or swelling Skin-warm and dry, no hyperpigmentation, vitiligo, or suspicious lesions  Neurological Examination   Mental Status: Alert, oriented, thought content appropriate.  Speech fluent without evidence of aphasia.  Able to follow 3 step commands without difficulty. Cranial Nerves: II: Discs flat bilaterally; Visual fields grossly normal, pupils equal, round, reactive to light and accommodation III,IV, VI: ptosis not present, extra-ocular motions intact bilaterally V,VII: smile symmetric, facial light touch sensation normal bilaterally VIII: hearing normal bilaterally IX,X: gag reflex present XI: bilateral shoulder shrug XII: midline tongue extension Motor: Right : Upper extremity   5/5    Left:     Upper extremity   5/5  Lower extremity   5/5     Lower extremity   5/5 Tone and bulk:normal tone throughout; no atrophy noted Sensory: Pinprick and light touch decreased in the RUE Deep Tendon Reflexes: Symmetric throughout Plantars: Right: downgoing   Left: downgoing Cerebellar: Normal finger-to-nose and normal  heel-to-shin testing bilaterally Gait: not tested due to safety concerns    Laboratory Studies:   Basic Metabolic Panel: Recent Labs  Lab 02/06/20 1710  NA 135  K 4.8  CL 104  CO2 19*  GLUCOSE 107*  BUN 9  CREATININE 0.86  CALCIUM 8.7*  MG 2.0    Liver Function Tests: No results for input(s): AST, ALT, ALKPHOS, BILITOT, PROT, ALBUMIN in the last 168 hours. No results for input(s): LIPASE, AMYLASE in the last 168 hours. No results for input(s): AMMONIA in the last 168 hours.  CBC: Recent Labs  Lab 02/06/20 1710  WBC 10.0  NEUTROABS 6.0  HGB 10.4*  HCT 35.2*  MCV 82.1  PLT 396    Cardiac Enzymes: No results for input(s): CKTOTAL, CKMB, CKMBINDEX, TROPONINI in the last 168 hours.  BNP: Invalid input(s): POCBNP  CBG: Recent Labs  Lab 02/07/20 0534 02/07/20 0916 02/08/20 0638 02/08/20 0735  GLUCAP 89 78 89 83    Microbiology: Results for orders placed or performed during the hospital encounter of 02/06/20  SARS CORONAVIRUS 2 (TAT 6-24 HRS) Nasopharyngeal Nasopharyngeal Swab     Status: None   Collection Time: 02/06/20 10:09 PM   Specimen: Nasopharyngeal Swab  Result Value Ref Range Status   SARS Coronavirus 2 NEGATIVE NEGATIVE Final    Comment: (NOTE) SARS-CoV-2 target nucleic acids are NOT DETECTED. The SARS-CoV-2 RNA is generally detectable in upper and lower respiratory specimens during the acute phase of infection. Negative results do not preclude SARS-CoV-2 infection, do not rule out co-infections with other pathogens,  and should not be used as the sole basis for treatment or other patient management decisions. Negative results must be combined with clinical observations, patient history, and epidemiological information. The expected result is Negative. Fact Sheet for Patients: SugarRoll.be Fact Sheet for Healthcare Providers: https://www.woods-mathews.com/ This test is not yet approved or cleared by the  [place] FDA and  has been authorized for detection and/or diagnosis of SARS-CoV-2 by FDA under an Emergency Use Authorization (EUA). This EUA will remain  in effect (meaning this test can be used) for the duration of the COVID-19 declaration under Section 56 4(b)(1) of the Act, 21 U.S.C. section 360bbb-3(b)(1), unless the authorization is terminated or revoked sooner. Performed at [facility], [place] [address] [place], [place] [address]     Coagulation Studies: No results for input(s): LABPROT, INR in the last 72 hours.  Urinalysis: No results for input(s): COLORURINE, LABSPEC, PHURINE, GLUCOSEU, HGBUR, BILIRUBINUR, KETONESUR, PROTEINUR, UROBILINOGEN, NITRITE, LEUKOCYTESUR in the last 168 hours.  Invalid input(s): APPERANCEUR  Lipid Panel:  No results found for: CHOL, TRIG, HDL, CHOLHDL, VLDL, LDLCALC  HgbA1C:  Lab Results  Component Value Date   HGBA1C 5.7 (H) 09/10/2019    Urine Drug Screen:      Component Value Date/Time   LABOPIA NONE DETECTED 02/06/2020 2040   COCAINSCRNUR NONE DETECTED 02/06/2020 2040   LABBENZ NONE DETECTED 02/06/2020 2040   AMPHETMU NONE DETECTED 02/06/2020 2040   THCU POSITIVE (A) 02/06/2020 2040   LABBARB POSITIVE (A) 02/06/2020 2040    Alcohol Level: No results for input(s): ETH in the last 168 hours.  Other results: EKG: sinus tachycardia at 133 bpm  Imaging: EEG  Result Date: 02/07/2020 Alexis Goodell, MD     02/07/2020  6:07 PM ELECTROENCEPHALOGRAM REPORT Patient: Cindy Hays       Room #: 137A-AA EEG No. ID: [id number] Age: 39 y.o.        Sex: female Requesting Physician: Memon Report Date:  02/07/2020       Interpreting Physician: Alexis Goodell History: Cindy Hays is an 39 y.o. female with recurrent syncope Medications: Pamelor, Desyrel, Zonegran Conditions of Recording:  This is a 21 channel routine scalp EEG performed with bipolar and monopolar montages arranged in accordance to the international 10/20 system of  electrode placement. One channel was dedicated to EKG recording. The patient is in the awake and drowsy states. Description:  The waking background activity consists of a low voltage, symmetrical, fairly well organized, 10 Hz alpha activity, seen from the parieto-occipital and posterior temporal regions.  Low voltage fast activity, poorly organized, is seen anteriorly and is at times superimposed on more posterior regions.  A mixture of theta and alpha rhythms are seen from the central and temporal regions. The patient drowses with slowing to irregular, low voltage theta and beta activity.  Stage II sleep is not obtained. No epileptiform activity is noted.  Hyperventilation was not performed.  Intermittent photic stimulation was performed but failed to illicit any change in the tracing. IMPRESSION: Normal electroencephalogram, awake, drowsy and with activation procedures. There are no focal lateralizing or epileptiform features. Alexis Goodell, MD Neurology [phone] 02/07/2020, 6:02 PM   CT Head Wo Contrast  Result Date: 02/06/2020 CLINICAL DATA:  Severe headache, tachycardia, syncope EXAM: CT HEAD WITHOUT CONTRAST TECHNIQUE: Contiguous axial images were obtained from the base of the skull through the vertex without intravenous contrast. COMPARISON:  12/18/2019 FINDINGS: Brain: No evidence of acute infarction, hemorrhage, hydrocephalus, extra-axial collection or mass lesion/mass effect. Vascular: No  hyperdense vessel or unexpected calcification. Skull: Normal. Negative for fracture or focal lesion. Sinuses/Orbits: The visualized paranasal sinuses are essentially clear. The mastoid air cells are unopacified. Other: None. IMPRESSION: Normal head CT. Electronically Signed   By: Julian Hy M.D.   On: 02/06/2020 17:54   CT Angio Chest PE W and/or Wo Contrast  Result Date: 02/06/2020 CLINICAL DATA:  Shortness of breath. EXAM: CT ANGIOGRAPHY CHEST WITH CONTRAST TECHNIQUE: Multidetector CT imaging of the  chest was performed using the standard protocol during bolus administration of intravenous contrast. Multiplanar CT image reconstructions and MIPs were obtained to evaluate the vascular anatomy. CONTRAST:  32mL OMNIPAQUE IOHEXOL 350 MG/ML SOLN COMPARISON:  July 03, 2014 FINDINGS: Cardiovascular: Satisfactory opacification of the pulmonary arteries to the segmental level. No evidence of pulmonary embolism. Normal heart size. No pericardial effusion. Mediastinum/Nodes: No enlarged mediastinal, hilar, or axillary lymph nodes. Thyroid gland, trachea, and esophagus demonstrate no significant findings. Lungs/Pleura: A stable 5 mm noncalcified lung nodule is seen within the anterolateral aspect of the right lower lobe (axial CT image 36, CT series number 6). There is no evidence of acute infiltrate, pleural effusion or pneumothorax. Upper Abdomen: No acute abnormality. Musculoskeletal: No chest wall abnormality. No acute or significant osseous findings. Review of the MIP images confirms the above findings. IMPRESSION: 1. No CT evidence of pulmonary embolism or other acute intrathoracic pathology. 2. Stable 5 mm noncalcified right lower lobe lung nodule. Electronically Signed   By: Virgina Norfolk M.D.   On: 02/06/2020 18:51   MR ANGIO HEAD WO CONTRAST  Result Date: 02/07/2020 CLINICAL DATA:  Headache and syncope EXAM: MRA HEAD WITHOUT CONTRAST TECHNIQUE: Angiographic images of the Circle of Willis were obtained using MRA technique without intravenous contrast. COMPARISON:  None. FINDINGS: Both vertebral arteries patent to the basilar. Left PICA patent. Right PICA not visualized. AICA patent bilaterally. Basilar widely patent. Superior cerebellar and posterior cerebral arteries patent bilaterally. Internal carotid artery white the patent bilaterally. Anterior and middle cerebral arteries widely patent bilaterally without stenosis Negative for aneurysm or vascular malformation. IMPRESSION: Negative MRA head  Electronically Signed   By: Franchot Gallo M.D.   On: 02/07/2020 15:14   MR BRAIN WO CONTRAST  Result Date: 02/07/2020 CLINICAL DATA:  Headache and syncope EXAM: MRI HEAD WITHOUT CONTRAST TECHNIQUE: Multiplanar, multiecho pulse sequences of the brain and surrounding structures were obtained without intravenous contrast. COMPARISON:  CT head 02/06/2020 FINDINGS: Brain: No acute infarction, hemorrhage, hydrocephalus, extra-axial collection or mass lesion. Normal white matter. Vascular: Normal arterial flow voids Skull and upper cervical spine: No focal skeletal abnormality. Sinuses/Orbits: Negative Other: None IMPRESSION: Negative MRI head Electronically Signed   By: Franchot Gallo M.D.   On: 02/07/2020 15:12   MR MRV HEAD W WO CONTRAST  Result Date: 02/07/2020 CLINICAL DATA:  Syncope and headache EXAM: MR VENOGRAM HEAD WITHOUT AND WITH CONTRAST CONTRAST:  8 mL Gadovist IV TECHNIQUE: Angiographic images of the intracranial venous structures were obtained using MRV technique without and with intravenous contrast. COMPARISON:  None. FINDINGS: Dural sinuses are widely patent and normal bilaterally. No venous sinus thrombosis or occlusion. No significant stenosis. No vascular malformation. IMPRESSION: Negative MRV head Electronically Signed   By: Franchot Gallo M.D.   On: 02/07/2020 15:17   ECHOCARDIOGRAM COMPLETE  Result Date: 02/07/2020    ECHOCARDIOGRAM REPORT   Patient Name:   SYBRINA Pardi Date of Exam: 02/07/2020 Medical Rec #:  [id number]       Height:  61.0 in Accession #:    [id number]      Weight:       180.0 lb Date of Birth:  04/10/1981       BSA:          1.806 m Patient Age:    38 years        BP:           118/68 mmHg Patient Gender: F               HR:           94 bpm. Exam Location:  [facility] Procedure: Color Doppler and Cardiac Doppler Indications:     Syncope 780.2  History:         Patient has no prior history of Echocardiogram examinations.                  Risk Factors:Hypertension.   Sonographer:     Sherrie Sport RDCS (AE) Referring Phys:  [id number] Athena Masse Diagnosing Phys: Nelva Bush MD IMPRESSIONS  1. Left ventricular ejection fraction, by estimation, is 70 to 75%. The left ventricle has hyperdynamic function. The left ventricle has no regional wall motion abnormalities. There is mild left ventricular hypertrophy. Left ventricular diastolic parameters were normal.  2. Right ventricular systolic function is normal. The right ventricular size is normal. There is normal pulmonary artery systolic pressure.  3. The mitral valve is normal in structure. No evidence of mitral valve regurgitation. No evidence of mitral stenosis.  4. The aortic valve was not well visualized. Aortic valve regurgitation is not visualized. No aortic stenosis is present. FINDINGS  Left Ventricle: Left ventricular ejection fraction, by estimation, is 70 to 75%. The left ventricle has hyperdynamic function. The left ventricle has no regional wall motion abnormalities. The left ventricular internal cavity size was normal in size. There is mild left ventricular hypertrophy. Left ventricular diastolic parameters were normal. Right Ventricle: The right ventricular size is normal. No increase in right ventricular wall thickness. Right ventricular systolic function is normal. There is normal pulmonary artery systolic pressure. Left Atrium: Left atrial size was normal in size. Right Atrium: Right atrial size was normal in size. Pericardium: The pericardium was not well visualized. Mitral Valve: The mitral valve is normal in structure. No evidence of mitral valve regurgitation. No evidence of mitral valve stenosis. Tricuspid Valve: The tricuspid valve is not well visualized. Tricuspid valve regurgitation is trivial. Aortic Valve: The aortic valve was not well visualized. Aortic valve regurgitation is not visualized. No aortic stenosis is present. Aortic valve mean gradient measures 5.0 mmHg. Aortic valve peak gradient  measures 7.4 mmHg. Aortic valve area, by VTI measures 2.64 cm. Pulmonic Valve: The pulmonic valve was not well visualized. Pulmonic valve regurgitation is not visualized. No evidence of pulmonic stenosis. Aorta: The aortic root is normal in size and structure. Pulmonary Artery: The pulmonary artery is not well seen. Venous: The inferior vena cava was not well visualized. IAS/Shunts: The interatrial septum was not well visualized.  LEFT VENTRICLE PLAX 2D LVIDd:         3.99 cm  Diastology LVIDs:         2.18 cm  LV e' lateral:   11.40 cm/s LV PW:         1.14 cm  LV E/e' lateral: 7.1 LV IVS:        1.02 cm  LV e' medial:    9.46 cm/s LVOT diam:     2.00 cm  LV E/e' medial:  8.6 LV SV:         63 LV SV Index:   35 LVOT Area:     3.14 cm  RIGHT VENTRICLE RV Basal diam:  3.50 cm RV S prime:     13.30 cm/s TAPSE (M-mode): 3.0 cm LEFT ATRIUM             Index       RIGHT ATRIUM           Index LA diam:        2.60 cm 1.44 cm/m  RA Area:     14.40 cm LA Vol (A2C):   52.8 ml 29.24 ml/m RA Volume:   36.20 ml  20.04 ml/m LA Vol (A4C):   52.1 ml 28.85 ml/m LA Biplane Vol: 54.3 ml 30.07 ml/m  AORTIC VALVE                    PULMONIC VALVE AV Area (Vmax):    2.43 cm     PV Vmax:        0.60 m/s AV Area (Vmean):   2.22 cm     PV Peak grad:   1.4 mmHg AV Area (VTI):     2.64 cm     RVOT Peak grad: 5 mmHg AV Vmax:           136.00 cm/s AV Vmean:          107.000 cm/s AV VTI:            0.240 m AV Peak Grad:      7.4 mmHg AV Mean Grad:      5.0 mmHg LVOT Vmax:         105.00 cm/s LVOT Vmean:        75.700 cm/s LVOT VTI:          0.202 m LVOT/AV VTI ratio: 0.84  AORTA Ao Root diam: 2.60 cm MITRAL VALVE               TRICUSPID VALVE MV Area (PHT): 4.24 cm    TR Peak grad:   22.3 mmHg MV Decel Time: 179 msec    TR Vmax:        236.00 cm/s MV E velocity: 81.00 cm/s MV A velocity: 93.10 cm/s  SHUNTS MV E/A ratio:  0.87        Systemic VTI:  0.20 m                            Systemic Diam: 2.00 cm Nelva Bush MD  Electronically signed by Nelva Bush MD Signature Date/Time: 02/07/2020/12:22:27 PM    Final      Assessment/Plan: 39 y.o. female with a history of occipital neuralgia, HTN and anemia presenting with headache and frequent, recurrent syncopal episodes.  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  Patient at baseline currently with a headache rated at 4/10.  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  Head CT reviewed and shows no acute changes.  Further work up recommended.  Fibrinogen slightly elevated.  TSH normal.  Echocardiogram pending.  - Negative MRI, MRA, MRV and headache resolved - out patient LP to be scheduled for possible pseudotumor cerebri - EEG no acute abnormalities - She would like to be d/c - d/w patient over the counter magnesium 500-600mg  and daily Vitamin  B complex.   - f/u Dr. Manuella Ghazi as out patient.

## 2020-02-08 NOTE — Discharge Summary (Signed)
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 Anemia   . Hypertension   . Preterm labor     HOSPITAL COURSE:  39 year old female with a history of positive ANA test but does not carry a diagnosis of autoimmune disorder, chronic headaches who is followed by outpatient neurology and recently underwent occipital nerve block on 3/19.  She is on multiple medications for headaches.  She presents to the hospital with tachycardia, worsening headache and head with no syncopal episodes while sitting in triage.   1. Syncope.  Patient had multiple episodes of syncope, which occurred in the emergency room and also prior to admission.  These episodes lasted a few seconds.   Etiology of her symptoms is not entirely clear -- per neurology and cardiology evaluation this could be vasovagal, ventricular arrhythmia, hyperventilating syndrome .She was also tachycardic which has since improved.  She is not had any prior episodes of the same.  Echocardiogram    showed EF of 70 to 70%. No wall motion abnormalities noted.   consult cardiology noted. Recommends outpatient workup and follow-up. Patient may need electrophysiologist to evaluate as outpatient. Outpatient event monitor not recommended by cardiology.  -EEG negative -patient overall feels better. Headaches improved. No syncopal episode since admission. -Cardiology advises patient not to drive for six  months -patient is hemodynamically stable heart rate 72 to 84 blood pressure 121/77. -CT head, MRI, MRA, MRV of the brain negative.  2.Chronic headaches.  She is followed by Dr. Manuella Ghazi in the outpatient setting and recently had occipital nerve block on 3/19. -Seen by Dr. Irish Elders. Does not recommend LP at present. Patient will follow-up with Dr. Brigitte Pulse. She has appointment later this afternoon. -Have resumed her home meds.  3.ANA positive.   -defer to PCP for rheumatology referral as outpatient  Above was discussed with patient at length. She will discharge to home. CONSULTS OBTAINED:  Treatment Team:  Nelva Bush, MD Catarina Hartshorn, MD Alexis Goodell, MD  DRUG ALLERGIES:   Allergies  Allergen Reactions  . Vicodin [Hydrocodone-Acetaminophen] Rash    DISCHARGE MEDICATIONS:   Allergies as of 02/08/2020      Reactions   Vicodin [hydrocodone-acetaminophen] Rash      Medication List    STOP taking these medications   butalbital-acetaminophen-caffeine 50-325-40 MG tablet Commonly known as: FIORICET   prochlorperazine 10 MG tablet Commonly known as: COMPAZINE     TAKE these medications   albuterol 108 (90 Base) MCG/ACT inhaler Commonly known as: VENTOLIN HFA Inhale 1 puff into the lungs every 6 (six) hours as needed for wheezing or shortness of breath.   buPROPion 150 MG 24 hr tablet Commonly known as: WELLBUTRIN XL Take 150 mg by mouth daily.   ferrous sulfate 220 (44 Fe) MG/5ML solution Take 220 mg by mouth 2 (two) times daily with a meal.   loratadine 10 MG tablet Commonly known as: CLARITIN Take 10 mg by mouth daily.   medroxyPROGESTERone 150 MG/ML injection Commonly known as: DEPO-PROVERA Inject 150 mg  into the muscle every 3 (three) months.   MULTI-VITAMIN DAILY PO Take 1 tablet by mouth daily in the afternoon.   nortriptyline 10 MG capsule Commonly known as: PAMELOR TAKE 1 CAPSULE AT BEDTIME FOR 1 WEEK THEN INCREASE TO 2 CAPS AT BEDTIME    traZODone 50 MG tablet Commonly known as: DESYREL Take 1 tablet by mouth daily in the afternoon.   Vitamin D (Ergocalciferol) 1.25 MG (50000 UNIT) Caps capsule Commonly known as: DRISDOL Take 50,000 Units by mouth once a week.   zonisamide 100 MG capsule Commonly known as: ZONEGRAN Take 100 mg by mouth at bedtime.       If you experience worsening of your admission symptoms, develop shortness of breath, life threatening emergency, suicidal or homicidal thoughts you must seek medical attention immediately by calling 911 or calling your MD immediately  if symptoms less severe.  You Must read complete instructions/literature along with all the possible adverse reactions/side effects for all the Medicines you take and that have been prescribed to you. Take any new Medicines after you have completely understood and accept all the possible adverse reactions/side effects.   Please note  You were cared for by a hospitalist during your hospital stay. If you have any questions about your discharge medications or the care you received while you were in the hospital after you are discharged, you can call the unit and asked to speak with the hospitalist on call if the hospitalist that took care of you is not available. Once you are discharged, your primary care physician will handle any further medical issues. Please note that NO REFILLS for any discharge medications will be authorized once you are discharged, as it is imperative that you return to your primary care physician (or establish a relationship with a primary care physician if you do not have one) for your aftercare needs so that they can reassess your need for medications and monitor your lab values. Today   SUBJECTIVE  feel better. Tylenol helped me.  VITAL SIGNS:  Blood pressure 117/74, pulse 72, temperature 98.2 F (36.8 C), temperature source Oral, resp. rate 18, height 5\' 1"  (1.549 m), weight 88.2 kg, last menstrual period  01/09/2020, SpO2 100 %, not currently breastfeeding.  I/O:    Intake/Output Summary (Last 24 hours) at 02/08/2020 1150 Last data filed at 02/08/2020 1032 Gross per 24 hour  Intake 971.93 ml  Output --  Net 971.93 ml    PHYSICAL EXAMINATION:  GENERAL:  39 y.o.-year-old patient lying in the bed with no acute distress.  EYES: Pupils equal, round, reactive to light and accommodation. No scleral icterus.  HEENT: Head atraumatic, normocephalic. Oropharynx and nasopharynx clear.  NECK:  Supple, no jugular venous distention. No thyroid enlargement, no tenderness.  LUNGS: Normal breath sounds bilaterally, no wheezing, rales,rhonchi or crepitation. No use of accessory muscles of respiration.  CARDIOVASCULAR: S1, S2 normal. No murmurs, rubs, or gallops.  ABDOMEN: Soft, non-tender, non-distended. Bowel sounds present. No organomegaly or mass.  EXTREMITIES: No pedal edema, cyanosis, or clubbing.  NEUROLOGIC: Cranial nerves II through XII are intact. Muscle strength 5/5 in all extremities. Sensation intact. Gait not checked.  PSYCHIATRIC: The patient is alert and oriented x 3.  SKIN: No obvious rash, lesion, or ulcer.   DATA REVIEW:   CBC  Recent Labs  Lab 02/06/20 1710  WBC 10.0  HGB 10.4*  HCT 35.2*  PLT 396    Chemistries  Recent Labs  Lab 02/06/20 1710  NA 135  K 4.8  CL 104  CO2 19*  GLUCOSE 107*  BUN 9  CREATININE 0.86  CALCIUM 8.7*  MG 2.0    Microbiology Results   Recent Results (from the past 240 hour(s))  SARS CORONAVIRUS 2 (TAT 6-24 HRS) Nasopharyngeal Nasopharyngeal Swab     Status: None   Collection Time: 02/06/20 10:09 PM   Specimen: Nasopharyngeal Swab  Result Value Ref Range Status   SARS Coronavirus 2 NEGATIVE NEGATIVE Final    Comment: (NOTE) SARS-CoV-2 target nucleic acids are NOT DETECTED. The SARS-CoV-2 RNA is generally detectable in upper and lower respiratory specimens during the acute phase of infection. Negative results do not preclude  SARS-CoV-2 infection, do not rule out co-infections with other pathogens, and should not be used as the sole basis for treatment or other patient management decisions. Negative results must be combined with clinical observations, patient history, and epidemiological information. The expected result is Negative. Fact Sheet for Patients: SugarRoll.be Fact Sheet for Healthcare Providers: https://www.woods-mathews.com/ This test is not yet approved or cleared by the [place] FDA and  has been authorized for detection and/or diagnosis of SARS-CoV-2 by FDA under an Emergency Use Authorization (EUA). This EUA will remain  in effect (meaning this test can be used) for the duration of the COVID-19 declaration under Section 56 4(b)(1) of the Act, 21 U.S.C. section 360bbb-3(b)(1), unless the authorization is terminated or revoked sooner. Performed at [facility], [place] [address], [place], [place] [address]     RADIOLOGY:  EEG  Result Date: 02/07/2020 Alexis Goodell, MD     02/07/2020  6:07 PM ELECTROENCEPHALOGRAM REPORT Patient: Cindy Hays       Room #: 137A-AA EEG No. ID: [id number] Age: 39 y.o.        Sex: female Requesting Physician: Memon Report Date:  02/07/2020       Interpreting Physician: Alexis Goodell History: Cindy Hays is an 39 y.o. female with recurrent syncope Medications: Pamelor, Desyrel, Zonegran Conditions of Recording:  This is a 21 channel routine scalp EEG performed with bipolar and monopolar montages arranged in accordance to the international 10/20 system of electrode placement. One channel was dedicated to EKG recording. The patient is in the awake and drowsy states. Description:  The waking background activity consists of a low voltage, symmetrical, fairly well organized, 10 Hz alpha activity, seen from the parieto-occipital and posterior temporal regions.  Low voltage fast activity, poorly organized, is seen anteriorly  and is at times superimposed on more posterior regions.  A mixture of theta and alpha rhythms are seen from the central and temporal regions. The patient drowses with slowing to irregular, low voltage theta and beta activity.  Stage II sleep is not obtained. No epileptiform activity is noted.  Hyperventilation was not performed.  Intermittent photic stimulation was performed but failed to illicit any change in the tracing. IMPRESSION: Normal electroencephalogram, awake, drowsy and with activation procedures. There are no focal lateralizing or epileptiform features. Alexis Goodell, MD Neurology [phone] 02/07/2020, 6:02 PM   CT Head Wo Contrast  Result Date: 02/06/2020 CLINICAL DATA:  Severe headache, tachycardia, syncope EXAM: CT HEAD WITHOUT CONTRAST TECHNIQUE: Contiguous axial images were obtained from the base of the skull through the vertex without intravenous contrast. COMPARISON:  12/18/2019 FINDINGS: Brain: No evidence of acute infarction, hemorrhage, hydrocephalus, extra-axial collection or mass lesion/mass effect. Vascular: No hyperdense vessel or unexpected calcification. Skull: Normal. Negative for fracture or focal lesion. Sinuses/Orbits: The visualized paranasal sinuses are essentially clear. The mastoid air cells are unopacified.  Other: None. IMPRESSION: Normal head CT. Electronically Signed   By: Julian Hy M.D.   On: 02/06/2020 17:54   CT Angio Chest PE W and/or Wo Contrast  Result Date: 02/06/2020 CLINICAL DATA:  Shortness of breath. EXAM: CT ANGIOGRAPHY CHEST WITH CONTRAST TECHNIQUE: Multidetector CT imaging of the chest was performed using the standard protocol during bolus administration of intravenous contrast. Multiplanar CT image reconstructions and MIPs were obtained to evaluate the vascular anatomy. CONTRAST:  55mL OMNIPAQUE IOHEXOL 350 MG/ML SOLN COMPARISON:  July 03, 2014 FINDINGS: Cardiovascular: Satisfactory opacification of the pulmonary arteries to the segmental  level. No evidence of pulmonary embolism. Normal heart size. No pericardial effusion. Mediastinum/Nodes: No enlarged mediastinal, hilar, or axillary lymph nodes. Thyroid gland, trachea, and esophagus demonstrate no significant findings. Lungs/Pleura: A stable 5 mm noncalcified lung nodule is seen within the anterolateral aspect of the right lower lobe (axial CT image 36, CT series number 6). There is no evidence of acute infiltrate, pleural effusion or pneumothorax. Upper Abdomen: No acute abnormality. Musculoskeletal: No chest wall abnormality. No acute or significant osseous findings. Review of the MIP images confirms the above findings. IMPRESSION: 1. No CT evidence of pulmonary embolism or other acute intrathoracic pathology. 2. Stable 5 mm noncalcified right lower lobe lung nodule. Electronically Signed   By: Virgina Norfolk M.D.   On: 02/06/2020 18:51   MR ANGIO HEAD WO CONTRAST  Result Date: 02/07/2020 CLINICAL DATA:  Headache and syncope EXAM: MRA HEAD WITHOUT CONTRAST TECHNIQUE: Angiographic images of the Circle of Willis were obtained using MRA technique without intravenous contrast. COMPARISON:  None. FINDINGS: Both vertebral arteries patent to the basilar. Left PICA patent. Right PICA not visualized. AICA patent bilaterally. Basilar widely patent. Superior cerebellar and posterior cerebral arteries patent bilaterally. Internal carotid artery white the patent bilaterally. Anterior and middle cerebral arteries widely patent bilaterally without stenosis Negative for aneurysm or vascular malformation. IMPRESSION: Negative MRA head Electronically Signed   By: Franchot Gallo M.D.   On: 02/07/2020 15:14   MR BRAIN WO CONTRAST  Result Date: 02/07/2020 CLINICAL DATA:  Headache and syncope EXAM: MRI HEAD WITHOUT CONTRAST TECHNIQUE: Multiplanar, multiecho pulse sequences of the brain and surrounding structures were obtained without intravenous contrast. COMPARISON:  CT head 02/06/2020 FINDINGS: Brain: No  acute infarction, hemorrhage, hydrocephalus, extra-axial collection or mass lesion. Normal white matter. Vascular: Normal arterial flow voids Skull and upper cervical spine: No focal skeletal abnormality. Sinuses/Orbits: Negative Other: None IMPRESSION: Negative MRI head Electronically Signed   By: Franchot Gallo M.D.   On: 02/07/2020 15:12   MR MRV HEAD W WO CONTRAST  Result Date: 02/07/2020 CLINICAL DATA:  Syncope and headache EXAM: MR VENOGRAM HEAD WITHOUT AND WITH CONTRAST CONTRAST:  8 mL Gadovist IV TECHNIQUE: Angiographic images of the intracranial venous structures were obtained using MRV technique without and with intravenous contrast. COMPARISON:  None. FINDINGS: Dural sinuses are widely patent and normal bilaterally. No venous sinus thrombosis or occlusion. No significant stenosis. No vascular malformation. IMPRESSION: Negative MRV head Electronically Signed   By: Franchot Gallo M.D.   On: 02/07/2020 15:17   ECHOCARDIOGRAM COMPLETE  Result Date: 02/07/2020    ECHOCARDIOGRAM REPORT   Patient Name:   Cindy Hays Date of Exam: 02/07/2020 Medical Rec #:  [id number]       Height:       61.0 in Accession #:    [id number]      Weight:       180.0 lb Date of Birth:  1981/02/10  BSA:          1.806 m Patient Age:    38 years        BP:           118/68 mmHg Patient Gender: F               HR:           94 bpm. Exam Location:  [facility] Procedure: Color Doppler and Cardiac Doppler Indications:     Syncope 780.2  History:         Patient has no prior history of Echocardiogram examinations.                  Risk Factors:Hypertension.  Sonographer:     Sherrie Sport RDCS (AE) Referring Phys:  [id number] Athena Masse Diagnosing Phys: Nelva Bush MD IMPRESSIONS  1. Left ventricular ejection fraction, by estimation, is 70 to 75%. The left ventricle has hyperdynamic function. The left ventricle has no regional wall motion abnormalities. There is mild left ventricular hypertrophy. Left ventricular diastolic  parameters were normal.  2. Right ventricular systolic function is normal. The right ventricular size is normal. There is normal pulmonary artery systolic pressure.  3. The mitral valve is normal in structure. No evidence of mitral valve regurgitation. No evidence of mitral stenosis.  4. The aortic valve was not well visualized. Aortic valve regurgitation is not visualized. No aortic stenosis is present. FINDINGS  Left Ventricle: Left ventricular ejection fraction, by estimation, is 70 to 75%. The left ventricle has hyperdynamic function. The left ventricle has no regional wall motion abnormalities. The left ventricular internal cavity size was normal in size. There is mild left ventricular hypertrophy. Left ventricular diastolic parameters were normal. Right Ventricle: The right ventricular size is normal. No increase in right ventricular wall thickness. Right ventricular systolic function is normal. There is normal pulmonary artery systolic pressure. Left Atrium: Left atrial size was normal in size. Right Atrium: Right atrial size was normal in size. Pericardium: The pericardium was not well visualized. Mitral Valve: The mitral valve is normal in structure. No evidence of mitral valve regurgitation. No evidence of mitral valve stenosis. Tricuspid Valve: The tricuspid valve is not well visualized. Tricuspid valve regurgitation is trivial. Aortic Valve: The aortic valve was not well visualized. Aortic valve regurgitation is not visualized. No aortic stenosis is present. Aortic valve mean gradient measures 5.0 mmHg. Aortic valve peak gradient measures 7.4 mmHg. Aortic valve area, by VTI measures 2.64 cm. Pulmonic Valve: The pulmonic valve was not well visualized. Pulmonic valve regurgitation is not visualized. No evidence of pulmonic stenosis. Aorta: The aortic root is normal in size and structure. Pulmonary Artery: The pulmonary artery is not well seen. Venous: The inferior vena cava was not well visualized.  IAS/Shunts: The interatrial septum was not well visualized.  LEFT VENTRICLE PLAX 2D LVIDd:         3.99 cm  Diastology LVIDs:         2.18 cm  LV e' lateral:   11.40 cm/s LV PW:         1.14 cm  LV E/e' lateral: 7.1 LV IVS:        1.02 cm  LV e' medial:    9.46 cm/s LVOT diam:     2.00 cm  LV E/e' medial:  8.6 LV SV:         63 LV SV Index:   35 LVOT Area:     3.14 cm  RIGHT  VENTRICLE RV Basal diam:  3.50 cm RV S prime:     13.30 cm/s TAPSE (M-mode): 3.0 cm LEFT ATRIUM             Index       RIGHT ATRIUM           Index LA diam:        2.60 cm 1.44 cm/m  RA Area:     14.40 cm LA Vol (A2C):   52.8 ml 29.24 ml/m RA Volume:   36.20 ml  20.04 ml/m LA Vol (A4C):   52.1 ml 28.85 ml/m LA Biplane Vol: 54.3 ml 30.07 ml/m  AORTIC VALVE                    PULMONIC VALVE AV Area (Vmax):    2.43 cm     PV Vmax:        0.60 m/s AV Area (Vmean):   2.22 cm     PV Peak grad:   1.4 mmHg AV Area (VTI):     2.64 cm     RVOT Peak grad: 5 mmHg AV Vmax:           136.00 cm/s AV Vmean:          107.000 cm/s AV VTI:            0.240 m AV Peak Grad:      7.4 mmHg AV Mean Grad:      5.0 mmHg LVOT Vmax:         105.00 cm/s LVOT Vmean:        75.700 cm/s LVOT VTI:          0.202 m LVOT/AV VTI ratio: 0.84  AORTA Ao Root diam: 2.60 cm MITRAL VALVE               TRICUSPID VALVE MV Area (PHT): 4.24 cm    TR Peak grad:   22.3 mmHg MV Decel Time: 179 msec    TR Vmax:        236.00 cm/s MV E velocity: 81.00 cm/s MV A velocity: 93.10 cm/s  SHUNTS MV E/A ratio:  0.87        Systemic VTI:  0.20 m                            Systemic Diam: 2.00 cm Nelva Bush MD Electronically signed by Nelva Bush MD Signature Date/Time: 02/07/2020/12:22:27 PM    Final      CODE STATUS:     Code Status Orders  (From admission, onward)         Start     Ordered   02/06/20 2112  Full code  Continuous     02/06/20 2115        Code Status History    Date Active Date Inactive Code Status Order ID Comments User Context   08/08/2018 0749  08/09/2018 2155 Full Code [id number]  [name], [place] Inpatient   08/07/2018 1737 08/08/2018 0749 Full Code [id number]  [name], [place] Inpatient   08/07/2018 1737 08/07/2018 1737 Full Code [id number]  [name], [place] Inpatient   08/05/2018 1208 08/05/2018 2039 Full Code [id number]  Lisette Grinder, [place] Inpatient   08/02/2018 1103 08/02/2018 1418 Full Code [id number]  Ward, Honor Loh, MD Inpatient   07/30/2018 1505 07/30/2018 1909 Full Code [id number]  Francetta Found, CNM Inpatient   07/25/2018 2146 07/29/2018 2232 Full Code  [id number]  Lisette Grinder, [place] Inpatient   07/21/2018 1921 07/23/2018 2337 Full Code [id number]  Lisette Grinder, [place] Inpatient   11/13/2017 2109 11/14/2017 1618 Full Code [id number]  Benjaman Kindler, MD Inpatient   11/13/2017 [id number] 11/13/2017 2109 Full Code [id number]  Benjaman Kindler, MD Inpatient   11/10/2017 0924 11/10/2017 1418 Full Code [id number]  Lisette Grinder, CNM Inpatient   Advance Care Planning Activity       TOTAL TIME TAKING CARE OF THIS PATIENT: *35* minutes.    Fritzi Mandes M.D  [facility]    CC: Primary care physician; Ward, Honor Loh, MD

## 2020-02-08 NOTE — Progress Notes (Signed)
DC papers was given and discussed to pt including Leave Note. Pt for DC now and will follow up with her neurology appt at 0330pm today and Lumbar Puncture appt at 10am 02/09/20. Pt left the floor via wheelchair by the RN.

## 2020-02-09 ENCOUNTER — Ambulatory Visit
Admit: 2020-02-09 | Discharge: 2020-02-09 | Disposition: A | Discharge status: Home / Self Care | Payer: 59 | Attending: Neurology | Admitting: Neurology

## 2020-02-09 DIAGNOSIS — G932 Benign intracranial hypertension: Secondary | ICD-10-CM

## 2020-02-09 LAB — CSF CELL COUNT WITH DIFFERENTIAL
Eosinophils, CSF: 0 %
Lymphs, CSF: 72 %
Monocyte-Macrophage-Spinal Fluid: 28 %
Other Cells, CSF: 0
RBC Count, CSF: 42 /mm3 — ABNORMAL HIGH (ref 0–3)
Segmented Neutrophils-CSF: 0 %
Tube #: 3
WBC, CSF: 1 /mm3 (ref 0–5)

## 2020-02-09 LAB — PATHOLOGIST SMEAR REVIEW

## 2020-02-09 LAB — PROTEIN, CSF: Total  Protein, CSF: 33 mg/dL (ref 15–45)

## 2020-02-09 LAB — PROTIME-INR
INR: 1 (ref 0.8–1.2)
Prothrombin Time: 12.8 seconds (ref 11.4–15.2)

## 2020-02-09 LAB — GLUCOSE, CSF: Glucose, CSF: 46 mg/dL (ref 40–70)

## 2020-02-09 LAB — APTT: aPTT: 29 seconds (ref 24–36)

## 2020-02-09 LAB — GLUCOSE, RANDOM: Glucose, Bld: 88 mg/dL (ref 70–99)

## 2020-02-09 LAB — POCT PREGNANCY, URINE: Preg Test, Ur: NEGATIVE

## 2020-02-09 MED ORDER — ACETAMINOPHEN 325 MG PO TABS
ORAL_TABLET | ORAL | Status: AC
  Filled 2020-02-09: qty 2

## 2020-02-09 MED ORDER — ACETAMINOPHEN 325 MG PO TABS
650.0000 mg | ORAL_TABLET | Freq: Once | ORAL | Status: AC
  Administered 2020-02-09: 650 mg via ORAL

## 2020-02-09 NOTE — OR Nursing (Signed)
Pt. Returned from LP procedure. Pt. Awake and alert in NAD with VSS. Pt. Remains supine.

## 2020-02-09 NOTE — OR Nursing (Signed)
PT requesting tylenol for a headache, pt states she took dose this am prior to arrival. Pt denies any allergy to tylenol. Spoke with Dr. Elige Radon about order, see [facility]

## 2020-02-09 NOTE — Progress Notes (Signed)
Pt stable after lp.VSS,Back stable.D/C instructions given.F/U with her MD.

## 2020-02-09 NOTE — OR Nursing (Signed)
Called radiology and gave last lab values to [place]. Sent a copy of abnormal CBC in paperwork. Pt. Taken to room 4 by Vicente Males. Pt. In NAD and VSS.

## 2020-02-10 LAB — IGG, IGA, IGM
IgA: 399 mg/dL — ABNORMAL HIGH (ref 87–352)
IgG (Immunoglobin G), Serum: 1566 mg/dL (ref 586–1602)
IgM (Immunoglobulin M), Srm: 66 mg/dL (ref 26–217)

## 2020-02-14 LAB — OLIGOCLONAL BANDS, CSF + SERM

## 2020-02-16 LAB — METANEPHRINES, PLASMA
Metanephrine, Free: 40.7 pg/mL (ref 0.0–88.0)
Normetanephrine, Free: 83.8 pg/mL (ref 0.0–110.1)

## 2020-02-17 ENCOUNTER — Ambulatory Visit: Payer: 59 | Admitting: Physician Assistant

## 2020-02-17 NOTE — Progress Notes (Deleted)
Office Visit    Patient Name: Cindy Hays Date of Encounter: 02/17/2020  Primary Care Provider:  Ward, Honor Loh, MD Primary Cardiologist:  No primary care provider on file.  Chief Complaint    No chief complaint on file.   39 year old female with history of chronic intractable headache disorder including occipital neuralgia s/p nerve blocks most recently 4 days prior, ANA positive status without diagnosis of autoimmune disorder, anemia requiring blood transfusions in the past secondary to menorrhagia, hypertension, and obesity, and seen today for hospital follow-up.  Past Medical History    Past Medical History:  Diagnosis Date  . Anemia   . Hypertension   . Preterm labor    Past Surgical History:  Procedure Laterality Date  . NO PAST SURGERIES      Allergies  Allergies  Allergen Reactions  . Vicodin [Hydrocodone-Acetaminophen] Rash    History of Present Illness    Cindy Hays is a 39 y.o. female with PMH as above.  She has a history of elevated heart rates with prior Holter monitor approximately 5 years ago unrevealing.  She was diagnosed with intractable headaches in 12/2019 and has been on several medications for this.  She works from home, and during a break from work, she stood up and walked to the doorway at which time she became flushed and noted tachypalpitations.  She then suffered a witnessed syncopal episode by her daughter.  Per daughter, she was out for 1 to 2 minutes.  Per patient, the next thing she remembers is waking up in her bed with EMS present.  Upon arrival in the ED, she had several other brief syncopal episodes, though she was not on telemetry during these events.  Each were several seconds in duration.  They were all preceded by flushing and tachypalpitations.  No postictal state following each episode.  The case was discussed with neurology, which did not feel that her syncopal episodes were related to her recent nerve block.  It was thought  that her tachycardic rates were secondary to medications, anemia, obesity, and physical deconditioning.  Pheochromocytoma was also noted to be considered.  LVSF was hyperdynamic with mild LVH.  Cardiology recommendations were for outpatient cardiac monitoring and cardiac MRI.  It was recommended she avoid driving for 6 months.  Home Medications    Prior to Admission medications   Medication Sig Start Date End Date Taking? Authorizing Provider  acetaminophen (TYLENOL) 500 MG tablet Take 500 mg by mouth every 6 (six) hours as needed.    [provider]  albuterol (VENTOLIN HFA) 108 (90 Base) MCG/ACT inhaler Inhale 1 puff into the lungs every 6 (six) hours as needed for wheezing or shortness of breath.    [provider]  buPROPion (WELLBUTRIN XL) 150 MG 24 hr tablet Take 150 mg by mouth daily. 12/21/19   [provider]  ferrous sulfate 220 (44 Fe) MG/5ML solution Take 220 mg by mouth 2 (two) times daily with a meal.  11/16/19   [provider]  loratadine (CLARITIN) 10 MG tablet Take 10 mg by mouth daily. 02/03/20   [provider]  medroxyPROGESTERone (DEPO-PROVERA) 150 MG/ML injection Inject 150 mg into the muscle every 3 (three) months.    [provider]  Multiple Vitamin (MULTI-VITAMIN DAILY PO) Take 1 tablet by mouth daily in the afternoon.     [provider]  nortriptyline (PAMELOR) 10 MG capsule TAKE 1 CAPSULE AT BEDTIME FOR 1 WEEK THEN INCREASE TO 2 CAPS AT  BEDTIME 12/30/19   [provider]  traZODone (DESYREL) 50 MG tablet Take 1 tablet by mouth daily in the afternoon. 02/03/20   [provider]  Vitamin D, Ergocalciferol, (DRISDOL) 1.25 MG (50000 UNIT) CAPS capsule Take 50,000 Units by mouth once a week. 01/31/20   [provider]  zonisamide (ZONEGRAN) 100 MG capsule Take 100 mg by mouth at bedtime. 02/02/20   [provider]  [name] 150-35 MCG/24HR transdermal patch 1 patch once a week. 09/04/19  11/18/19  [provider]    Review of Systems    ***.   All other systems reviewed and are otherwise negative except as noted above.  Physical Exam    VS:  There were no vitals taken for this visit. , BMI There is no height or weight on file to calculate BMI. GEN: Well nourished, well developed, in no acute distress. HEENT: normal. Neck: Supple, no JVD, carotid bruits, or masses. Cardiac: RRR, no murmurs, rubs, or gallops. No clubbing, cyanosis, edema.  Radials/DP/PT 2+ and equal bilaterally.  Respiratory:  Respirations regular and unlabored, clear to auscultation bilaterally. GI: Soft, nontender, nondistended, BS + x 4. MS: no deformity or atrophy. Skin: warm and dry, no rash. Neuro:  Strength and sensation are intact. Psych: Normal affect.  Accessory Clinical Findings    ECG personally reviewed by me today - *** - no acute changes.  VITALS Reviewed today   Temp Readings from Last 3 Encounters:  02/08/20 98.7 F (37.1 C) (Oral)  01/14/20 98.5 F (36.9 C) (Oral)  12/17/19 98.4 F (36.9 C) (Oral)   BP Readings from Last 3 Encounters:  02/08/20 121/77  01/14/20 127/87  12/18/19 131/81   Pulse Readings from Last 3 Encounters:  02/08/20 84  01/14/20 90  12/18/19 80    Wt Readings from Last 3 Encounters:  02/08/20 194 lb 7.1 oz (88.2 kg)  01/14/20 198 lb (89.8 kg)  12/17/19 198 lb (89.8 kg)     LABS  reviewed today   [place] present? {Yes/No:[id number]:::1}  Lab Results  Component Value Date   WBC 10.0 02/06/2020   HGB 10.4 (L) 02/06/2020   HCT 35.2 (L) 02/06/2020   MCV 82.1 02/06/2020   PLT 396 02/06/2020   Lab Results  Component Value Date   CREATININE 0.86 02/06/2020   BUN 9 02/06/2020   NA 135 02/06/2020   K 4.8 02/06/2020   CL 104 02/06/2020   CO2 19 (L) 02/06/2020   Lab Results  Component Value Date   ALT 47 (H) 09/10/2019   AST 31 09/10/2019   ALKPHOS 65 09/10/2019   BILITOT 0.5 09/10/2019   No results found for: CHOL,  HDL, LDLCALC, LDLDIRECT, TRIG, CHOLHDL  Lab Results  Component Value Date   HGBA1C 5.7 (H) 09/10/2019   Lab Results  Component Value Date   TSH 1.378 02/06/2020     STUDIES/PROCEDURES reviewed today   ***  Assessment & Plan    ***  Medication changes: *** Labs ordered: *** Studies / Imaging ordered: *** Future considerations: *** Disposition: ***  Total time spent with patient today *** minutes. This includes reviewing records, evaluating the patient, and coordinating care. Face-to-face time >50%.    Arvil Chaco, PA-C 02/17/2020

## 2020-02-20 ENCOUNTER — Encounter: Payer: Self-pay | Admitting: Physician Assistant

## 2020-05-13 ENCOUNTER — Other Ambulatory Visit: Payer: Self-pay

## 2020-05-13 ENCOUNTER — Encounter: Payer: Self-pay | Admitting: Emergency Medicine

## 2020-05-13 ENCOUNTER — Ambulatory Visit
Admission: EM | Admit: 2020-05-13 | Discharge: 2020-05-13 | Disposition: A | Discharge status: Home / Self Care | Payer: 59 | Attending: Emergency Medicine | Admitting: Emergency Medicine

## 2020-05-13 DIAGNOSIS — J029 Acute pharyngitis, unspecified: Secondary | ICD-10-CM | POA: Insufficient documentation

## 2020-05-13 DIAGNOSIS — Z20822 Contact with and (suspected) exposure to covid-19: Secondary | ICD-10-CM | POA: Diagnosis present

## 2020-05-13 DIAGNOSIS — J069 Acute upper respiratory infection, unspecified: Secondary | ICD-10-CM

## 2020-05-13 LAB — GROUP A STREP BY PCR: Group A Strep by PCR: NOT DETECTED

## 2020-05-13 MED ORDER — IBUPROFEN 600 MG PO TABS: 600.0000 mg | ORAL_TABLET | Freq: Four times a day (QID) | ORAL | 0 refills | Status: DC | PRN

## 2020-05-13 MED ORDER — FLUTICASONE PROPIONATE 50 MCG/ACT NA SUSP: 2.0000 | Freq: Every day | NASAL | 0 refills | Status: DC

## 2020-05-13 NOTE — ED Triage Notes (Signed)
Patient c/o sore throat and sinus congestion and pressure that started 2 days ago. Patient denies fevers.

## 2020-05-13 NOTE — ED Provider Notes (Signed)
HPI  SUBJECTIVE:  Cindy Hays is a 39 y.o. female who presents with 3 days of sinus pain and pressure, nasal congestion, sore throat worse on the left.  She reports postnasal drip.  No body aches, headaches, fevers, loss of sense of smell or taste, cough, nausea, vomiting, diarrhea, abdominal pain.  No known Covid exposure.  She did not get the vaccine.  No rash, voice changes, drooling, trismus, sensation of throat swelling shut, difficulty breathing, neck stiffness, voice changes.  No known exposure to strep or mono.  No antibiotics in the past month.  No antipyretic in the past 4 to 6 hours.  She tried sinus Mucinex herbal tea, throat lozenges with improvement in her symptoms.  She is not taking any antihistamines.  Symptoms are worse with swallowing.  She has a past medical history of allergies, anemia, SVT, hypertension, migraines.  No history of diabetes, frequent strep or mono.  LMP: 6 weeks ago.  Denies possibility of pregnancy.  States that she has not had intercourse in 18 months.  PMD: [facility]   Past Medical History:  Diagnosis Date  . Anemia   . Hypertension   . Preterm labor     Past Surgical History:  Procedure Laterality Date  . NO PAST SURGERIES      Family History  Problem Relation Age of Onset  . Hypertension Mother   . Alcohol abuse Mother   . Liver disease Mother   . Hypertension Maternal Grandmother   . Diabetes Maternal Grandmother   . Dementia Maternal Grandmother   . Autoimmune disease Daughter     Social History   Tobacco Use  . Smoking status: Never Smoker  . Smokeless tobacco: Never Used  Vaping Use  . Vaping Use: Never used  Substance Use Topics  . Alcohol use: Not Currently  . Drug use: No    No current facility-administered medications for this encounter.  Current Outpatient Medications:  .  albuterol (VENTOLIN HFA) 108 (90 Base) MCG/ACT inhaler, Inhale 1 puff into the lungs every 6 (six) hours as needed for wheezing or shortness  of breath., Disp: , Rfl:  .  ferrous sulfate 220 (44 Fe) MG/5ML solution, Take 220 mg by mouth 2 (two) times daily with a meal. , Disp: , Rfl:  .  Multiple Vitamin (MULTI-VITAMIN DAILY PO), Take 1 tablet by mouth daily in the afternoon. , Disp: , Rfl:  .  acetaminophen (TYLENOL) 500 MG tablet, Take 500 mg by mouth every 6 (six) hours as needed., Disp: , Rfl:  .  AJOVY 225 MG/1.5ML SOAJ, Inject into the skin., Disp: , Rfl:  .  buPROPion (WELLBUTRIN XL) 150 MG 24 hr tablet, Take 150 mg by mouth daily., Disp: , Rfl:  .  fluticasone (FLONASE) 50 MCG/ACT nasal spray, Place 2 sprays into both nostrils daily., Disp: 16 g, Rfl: 0 .  ibuprofen (ADVIL) 600 MG tablet, Take 1 tablet (600 mg total) by mouth every 6 (six) hours as needed., Disp: 30 tablet, Rfl: 0 .  medroxyPROGESTERone (DEPO-PROVERA) 150 MG/ML injection, Inject 150 mg into the muscle every 3 (three) months., Disp: , Rfl:  .  nortriptyline (PAMELOR) 10 MG capsule, TAKE 1 CAPSULE AT BEDTIME FOR 1 WEEK THEN INCREASE TO 2 CAPS AT BEDTIME, Disp: , Rfl:  .  traZODone (DESYREL) 50 MG tablet, Take 1 tablet by mouth daily in the afternoon., Disp: , Rfl:  .  Vitamin D, Ergocalciferol, (DRISDOL) 1.25 MG (50000 UNIT) CAPS capsule, Take 50,000 Units by mouth once a  week., Disp: , Rfl:  .  zonisamide (ZONEGRAN) 100 MG capsule, Take 100 mg by mouth at bedtime., Disp: , Rfl:   Allergies  Allergen Reactions  . Vicodin [Hydrocodone-Acetaminophen] Rash     ROS  As noted in HPI.   Physical Exam  BP 114/75 (BP Location: Left Arm)   Pulse 84   Temp 98.7 F (37.1 C) (Oral)   Resp 14   Ht 5\' 1"  (1.549 m)   Wt 86.2 kg   LMP 04/08/2020   SpO2 100%   Breastfeeding No   BMI 35.90 kg/m   Constitutional: Well developed, well nourished, no acute distress Eyes:  EOMI, conjunctiva normal bilaterally HENT: Normocephalic, atraumatic,mucus membranes moist.  Positive nasal congestion.  Normal turbinates.  No maxillary or frontal sinus tenderness.  TMs normal  bilaterally.  No mastoid tenderness.  Normal tonsils without exudates, no erythema.  Uvula midline.  Positive postnasal drip. Neck: Positive anterior cervical lymphadenopathy Respiratory: Normal inspiratory effort Cardiovascular: Normal rate, regular rhythm, no murmurs GI: nondistended, nontender, no splenomegaly skin: No rash, skin intact Musculoskeletal: no deformities Neurologic: Alert & oriented x 3, no focal neuro deficits Psychiatric: Speech and behavior appropriate   ED Course   Medications - No data to display  Orders Placed This Encounter  Procedures  . Group A Strep by PCR    Standing Status:   Standing    Number of Occurrences:   1  . SARS CORONAVIRUS 2 (TAT 6-24 HRS) Nasopharyngeal Nasopharyngeal Swab    Standing Status:   Standing    Number of Occurrences:   1    Order Specific Question:   Is this test for diagnosis or screening    Answer:   Diagnosis of ill patient    Order Specific Question:   Symptomatic for COVID-19 as defined by CDC    Answer:   Yes    Order Specific Question:   Date of Symptom Onset    Answer:   05/11/2020    Order Specific Question:   Hospitalized for COVID-19    Answer:   No    Order Specific Question:   Admitted to ICU for COVID-19    Answer:   No    Order Specific Question:   Previously tested for COVID-19    Answer:   No    Order Specific Question:   Resident in a congregate (group) care setting    Answer:   No    Order Specific Question:   Employed in healthcare setting    Answer:   No    Order Specific Question:   Pregnant    Answer:   No    Order Specific Question:   Has patient completed COVID vaccination(s) (2 doses of Pfizer/Moderna 1 dose of [employer])    Answer:   No    Results for orders placed or performed during the hospital encounter of 05/13/20 (from the past 24 hour(s))  Group A Strep by PCR     Status: None   Collection Time: 05/13/20  8:21 AM   Specimen: Throat; Sterile Swab  Result Value Ref Range    Group A Strep by PCR NOT DETECTED NOT DETECTED   No results found.  ED Clinical Impression  1. Viral upper respiratory tract infection   2. Viral pharyngitis   3. Encounter for screening laboratory testing for COVID-19 virus      ED Assessment/Plan  Suspect viral pharyngitis/URI.  Strep, Covid PCR sent.  Will contact patient at [phone] if  her strep is positive and will call in penicillin.  In the meantime, ibuprofen 600 mg combined with 1000 mg of Tylenol 3-4 times a day as needed for pain, saline nasal irrigation, Flonase, discontinue Mucinex sinus, start Mucinex D or regular Mucinex.  Discussed with her that there are no clear indications for antibiotics at this time.  Advised her to come back in a week if she is still sick and we can start antibiotics then.  Strep PCR negative.  Discussed labs,  MDM, treatment plan, and plan for follow-up with patient. patient agrees with plan.   Meds ordered this encounter  Medications  . fluticasone (FLONASE) 50 MCG/ACT nasal spray    Sig: Place 2 sprays into both nostrils daily.    Dispense:  16 g    Refill:  0  . ibuprofen (ADVIL) 600 MG tablet    Sig: Take 1 tablet (600 mg total) by mouth every 6 (six) hours as needed.    Dispense:  30 tablet    Refill:  0    *This clinic note was created using [employer]. Therefore, there may be occasional mistakes despite careful proofreading.   ?    Melynda Ripple, MD 05/13/20 [phone]

## 2020-05-13 NOTE — Discharge Instructions (Addendum)
Your strep and Covid PCR are pending.  We will contact you and call in the appropriate antibiotics if your strep PCR comes back positive.   1 gram of Tylenol and 600 mg ibuprofen together 3-4 times a day as needed for pain.  Make sure you drink plenty of extra fluids.  Some people find salt water gargles and  Traditional Medic[employer]'s "Throat Coat" tea helpful. Take 5 mL of liquid Benadryl and 5 mL of Maalox. Mix it together, and then hold it in your mouth for as long as you can and then swallow. You may do this 4 times a day.    saline nasal irrigation with a Milta Deiters med rinse and distilled water as often as you want, Flonase, discontinue Mucinex sinus, start Mucinex D or regular Mucinex.  This will prevent a bacterial sinus infection.  come back in a week or see your doctor if you are still sick and we can start antibiotics then.  Go to www.goodrx.com to look up your medications. This will give you a list of where you can find your prescriptions at the most affordable prices. Or ask the pharmacist what the cash price is, or if they have any other discount programs available to help make your medication more affordable. This can be less expensive than what you would pay with insurance.

## 2020-05-14 LAB — SARS CORONAVIRUS 2 (TAT 6-24 HRS): SARS Coronavirus 2: NEGATIVE

## 2020-08-27 ENCOUNTER — Encounter: Payer: Self-pay | Admitting: Emergency Medicine

## 2020-08-27 ENCOUNTER — Ambulatory Visit
Admission: EM | Admit: 2020-08-27 | Discharge: 2020-08-27 | Disposition: A | Discharge status: Home / Self Care | Payer: 59 | Attending: Physician Assistant | Admitting: Physician Assistant

## 2020-08-27 ENCOUNTER — Ambulatory Visit (INDEPENDENT_AMBULATORY_CARE_PROVIDER_SITE_OTHER): Payer: 59

## 2020-08-27 ENCOUNTER — Other Ambulatory Visit: Payer: Self-pay

## 2020-08-27 DIAGNOSIS — S8391XA Sprain of unspecified site of right knee, initial encounter: Secondary | ICD-10-CM

## 2020-08-27 DIAGNOSIS — M25561 Pain in right knee: Secondary | ICD-10-CM

## 2020-08-27 DIAGNOSIS — M25461 Effusion, right knee: Secondary | ICD-10-CM | POA: Diagnosis not present

## 2020-08-27 MED ORDER — TRAMADOL HCL 50 MG PO TABS: 50.0000 mg | ORAL_TABLET | Freq: Four times a day (QID) | ORAL | 0 refills | Status: AC | PRN

## 2020-08-27 MED ORDER — DICLOFENAC SODIUM 75 MG PO TBEC: 75.0000 mg | DELAYED_RELEASE_TABLET | Freq: Two times a day (BID) | ORAL | 1 refills | Status: AC

## 2020-08-27 NOTE — ED Provider Notes (Signed)
[facility]    CSN: [id number] Arrival date & time: 08/27/20  0813      History   Chief Complaint Chief Complaint  Patient presents with   Knee Injury    DOI 08/26/20    HPI Cindy Hays is a 39 y.o. female presenting for right knee pain following injury yesterday.  Patient states she fell onto the right knee and head on concrete.  Says it did not hurt that bad and she went on about her day.  She says that she was getting out of bed later and felt like the knee gave out and then she says she heard a "pop."  Patient says her pain has gotten worse throughout the night.  She says that whenever she walks it feels like the knee is going to "go out of place."  She says she has been icing the knee and taking ibuprofen for pain without much relief.  Says she has been taking the highest dose of ibuprofen and Tylenol without pain relief.  No history of fracture or significant tear in the knee. No other complaints or concerns today.  HPI  Past Medical History:  Diagnosis Date   Anemia    Hypertension    Preterm labor     Patient Active Problem List   Diagnosis Date Noted   NSVT (nonsustained ventricular tachycardia) (HCC)    Chronic headaches 02/06/2020   Recurrent syncope 02/06/2020   Occipital neuralgia 02/06/2020   Sinus tachycardia 02/06/2020   ANA positive 02/06/2020   Syncope 02/06/2020   Iron deficiency anemia 10/28/2019   Pregnancy 08/04/2018   Indication for care in labor or delivery 08/03/2018   Labor and delivery indication for care or intervention 08/02/2018   Chronic hypertension with superimposed pre-eclampsia 07/30/2018   Chronic hypertension with superimposed preeclampsia 07/21/2018   Short interval between pregnancies affecting pregnancy in first trimester, antepartum 03/18/2018   Advanced maternal age in multigravida, first trimester 03/18/2018   Fetal demise, greater than 22 weeks, antepartum, single gestation 11/13/2017     Past Surgical History:  Procedure Laterality Date   NO PAST SURGERIES      OB History    Gravida  6   Para  6   Term  4   Preterm  1   AB      Living  5     SAB      TAB      Ectopic      Multiple  0   Live Births  1            Home Medications    Prior to Admission medications   Medication Sig Start Date End Date Taking? Authorizing Provider  acetaminophen (TYLENOL) 500 MG tablet Take 500 mg by mouth every 6 (six) hours as needed.   Yes [provider]  AJOVY 225 MG/1.5ML SOAJ Inject into the skin. 03/21/20  Yes [provider]  albuterol (VENTOLIN HFA) 108 (90 Base) MCG/ACT inhaler Inhale 1 puff into the lungs every 6 (six) hours as needed for wheezing or shortness of breath.   Yes [provider]  ferrous sulfate 220 (44 Fe) MG/5ML solution Take 220 mg by mouth 2 (two) times daily with a meal.  11/16/19  Yes [provider]  ibuprofen (ADVIL) 600 MG tablet Take 1 tablet (600 mg total) by mouth every 6 (six) hours as needed. 05/13/20  Yes Melynda Ripple, MD  Multiple Vitamin (MULTI-VITAMIN DAILY PO) Take 1 tablet by mouth daily in  the afternoon.    Yes [provider]  norgestimate-ethinyl estradiol (SPRINTEC 28) 0.25-35 MG-MCG tablet TAKE 1 TABLET BY MOUTH 3 TIMES A DAY X 3 DAYS, THEN 1 TABLET 2 TIMES A DAY, THEN ONE TABLET DAILY 07/31/20  Yes [provider]  loratadine (CLARITIN) 10 MG tablet Take 10 mg by mouth daily. 02/03/20 05/13/20  [provider]  buPROPion (WELLBUTRIN XL) 150 MG 24 hr tablet Take 150 mg by mouth daily. 12/21/19   [provider]  diclofenac (VOLTAREN) 75 MG EC tablet Take 1 tablet (75 mg total) by mouth 2 (two) times daily for 15 days. 08/27/20 09/11/20  Laurene Footman B, PA-C  fluticasone (FLONASE) 50 MCG/ACT nasal spray Place 2 sprays into both nostrils daily. 05/13/20   Melynda Ripple, MD  medroxyPROGESTERone (DEPO-PROVERA) 150 MG/ML injection Inject 150 mg  into the muscle every 3 (three) months.    [provider]  nortriptyline (PAMELOR) 10 MG capsule TAKE 1 CAPSULE AT BEDTIME FOR 1 WEEK THEN INCREASE TO 2 CAPS AT BEDTIME 12/30/19   [provider]  traMADol (ULTRAM) 50 MG tablet Take 1 tablet (50 mg total) by mouth every 6 (six) hours as needed for up to 5 days. 08/27/20 09/01/20  Danton Clap, PA-C  traZODone (DESYREL) 50 MG tablet Take 1 tablet by mouth daily in the afternoon. 02/03/20   [provider]  Vitamin D, Ergocalciferol, (DRISDOL) 1.25 MG (50000 UNIT) CAPS capsule Take 50,000 Units by mouth once a week. 01/31/20   [provider]  zonisamide (ZONEGRAN) 100 MG capsule Take 100 mg by mouth at bedtime. 02/02/20   [provider]  [name] 150-35 MCG/24HR transdermal patch 1 patch once a week. 09/04/19 11/18/19  [provider]    Family History Family History  Problem Relation Age of Onset   Hypertension Mother    Alcohol abuse Mother    Liver disease Mother    Other Father        unknown medical history   Hypertension Maternal Grandmother    Diabetes Maternal Grandmother    Dementia Maternal Grandmother    Autoimmune disease Daughter     Social History Social History   Tobacco Use   Smoking status: Never Smoker   Smokeless tobacco: Never Used  Scientific laboratory technician Use: Never used  Substance Use Topics   Alcohol use: Yes    Comment: occassional   Drug use: No     Allergies   Vicodin [hydrocodone-acetaminophen]   Review of Systems Review of Systems  Constitutional: Negative for fatigue and fever.  Musculoskeletal: Positive for arthralgias and joint swelling. Negative for back pain and myalgias.  Skin: Negative for color change and wound.  Neurological: Positive for weakness (localized to knee). Negative for numbness.  Hematological: Does not bruise/bleed easily.     Physical Exam Triage Vital Signs ED Triage Vitals  Enc Vitals Group     BP  08/27/20 0909 122/84     Pulse Rate 08/27/20 0909 87     Resp 08/27/20 0909 18     Temp 08/27/20 0909 98.6 F (37 C)     Temp Source 08/27/20 0909 Oral     SpO2 08/27/20 0909 100 %     Weight 08/27/20 0910 190 lb (86.2 kg)     Height 08/27/20 0910 5' (1.524 m)     Head Circumference --      Peak Flow --      Pain Score 08/27/20 0909 10  Pain Loc --      Pain Edu? --      Excl. in [place]? --    No data found.  Updated Vital Signs BP 122/84 (BP Location: Left Arm)    Pulse 87    Temp 98.6 F (37 C) (Oral)    Resp 18    Ht 5' (1.524 m)    Wt 190 lb (86.2 kg)    LMP 08/13/2020 (Approximate)    SpO2 100%    BMI 37.11 kg/m       Physical Exam Vitals and nursing note reviewed.  Constitutional:      General: She is not in acute distress.    Appearance: Normal appearance. She is obese. She is not ill-appearing or toxic-appearing.  HENT:     Head: Normocephalic and atraumatic.  Eyes:     General: No scleral icterus.       Right eye: No discharge.        Left eye: No discharge.     Conjunctiva/sclera: Conjunctivae normal.  Cardiovascular:     Rate and Rhythm: Normal rate and regular rhythm.     Pulses: Normal pulses.  Pulmonary:     Effort: Pulmonary effort is normal. No respiratory distress.  Musculoskeletal:     Cervical back: Neck supple.     Right knee: Swelling (diffuse moderate swelling anterior knee) and bony tenderness (patella) present. Decreased range of motion (cannot fully extend or flex beyond 90 degrees). Tenderness present over the LCL. No lateral joint line tenderness. Normal pulse.     Left knee: Normal.  Skin:    General: Skin is dry.  Neurological:     General: No focal deficit present.     Mental Status: She is alert. Mental status is at baseline.     Motor: No weakness.     Gait: Gait abnormal.  Psychiatric:        Mood and Affect: Mood normal.        Behavior: Behavior normal.        Thought Content: Thought content normal.      UC Treatments /  Results  Labs (all labs ordered are listed, but only abnormal results are displayed) Labs Reviewed - No data to display  EKG   Radiology DG Knee Complete 4 Views Right  Result Date: 08/27/2020 CLINICAL DATA:  Right knee pain after fall yesterday. EXAM: RIGHT KNEE - COMPLETE 4+ VIEW COMPARISON:  None. FINDINGS: No evidence of fracture, dislocation, or joint effusion. No evidence of arthropathy or other focal bone abnormality. Soft tissues are unremarkable. IMPRESSION: Negative. Electronically Signed   By: Marijo Conception M.D.   On: 08/27/2020 09:55    Procedures Procedures (including critical care time)  Medications Ordered in UC Medications - No data to display  Initial Impression / Assessment and Plan / UC Course  I have reviewed the triage vital signs and the nursing notes.  Pertinent labs & imaging results that were available during my care of the patient were reviewed by me and considered in my medical decision making (see chart for details).    X-ray negative for fracture today.  Suspect sprain versus internal derangement of the knee including possible meniscus tear or ligament tear.  Patient unable to cooperate fully with exam due to pain and guarding.  Patient provided with knee brace today and advised RICE and NSAIDs.  Patient also given short-term supply of pain medication after requesting it.  Controlled substance database reviewed patient low risk for abuse.  Advised patient to follow-up if not feeling better over the next week or so with orthopedics.  Follow-up sooner for any worsening symptoms.  Final Clinical Impressions(s) / UC Diagnoses   Final diagnoses:  Sprain of right knee, unspecified ligament, initial encounter  Pain and swelling of right knee     Discharge Instructions     SPRAIN: Stressed avoiding painful activities . Reviewed RICE guidelines. Use medications as directed, including NSAIDs. If no NSAIDs have been prescribed for you today, you may take  Aleve or Motrin over the counter. May use Tylenol in between doses of NSAIDs.  If no improvement in the next 1-2 weeks, f/u with PCP or return to our office for reexamination, and please feel free to call or return at any time for any questions or concerns you may have and we will be happy to help you!      You have a condition requiring you to follow up with Orthopedics so please call one of the following office for appointment:   [facility] [address] [place], [place] [address] Phone: [phone]  [facility] [address], [place], [place] [address] Phone: [phone]     ED Prescriptions    Medication Sig Dispense Auth. Provider   diclofenac (VOLTAREN) 75 MG EC tablet Take 1 tablet (75 mg total) by mouth 2 (two) times daily for 15 days. 30 tablet Laurene Footman B, PA-C   traMADol (ULTRAM) 50 MG tablet Take 1 tablet (50 mg total) by mouth every 6 (six) hours as needed for up to 5 days. 15 tablet Danton Clap, PA-C     I have reviewed the PDMP during this encounter.   Danton Clap, PA-C 08/27/20 1812

## 2020-08-27 NOTE — Discharge Instructions (Addendum)
SPRAIN: Stressed avoiding painful activities . Reviewed RICE guidelines. Use medications as directed, including NSAIDs. If no NSAIDs have been prescribed for you today, you may take Aleve or Motrin over the counter. May use Tylenol in between doses of NSAIDs.  If no improvement in the next 1-2 weeks, f/u with PCP or return to our office for reexamination, and please feel free to call or return at any time for any questions or concerns you may have and we will be happy to help you!      You have a condition requiring you to follow up with Orthopedics so please call one of the following office for appointment:   [facility] [address] [place], [place] [address] Phone: [phone]  [facility] [address], [place], [place] [address] Phone: [phone]

## 2020-08-27 NOTE — ED Triage Notes (Addendum)
Patient in today after injuring her right knee yesterday. Patient states she was walking and went to turn around and her right knee gave out and she heard a pop. Patient's knee has gotten progressively worse through the night. Patient states she feels like her knee is going to "go out of place". Patient has been icing her knee and taking Ibuprofen without relief.

## 2021-03-12 DIAGNOSIS — G5601 Carpal tunnel syndrome, right upper limb: Secondary | ICD-10-CM | POA: Insufficient documentation

## 2021-03-18 IMAGING — MR MR MRA HEAD W/O CM
1 series · 20 of 48 positions shown · non-contrast
Comparison: None.

CLINICAL DATA: Headache and syncope

EXAM:
MRA HEAD WITHOUT CONTRAST
TECHNIQUE: Angiographic images of the Circle of Willis were obtained using MRA
technique without intravenous contrast.

[Series 5: TOF · axial · 0.5mm · 0.41mm/px · z∈[-140,-43]mm · 20 of 205 slices shown]
[im 1/205]
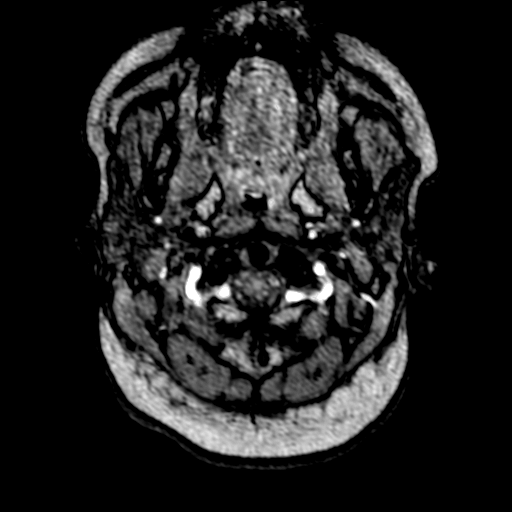
[im 5/205]
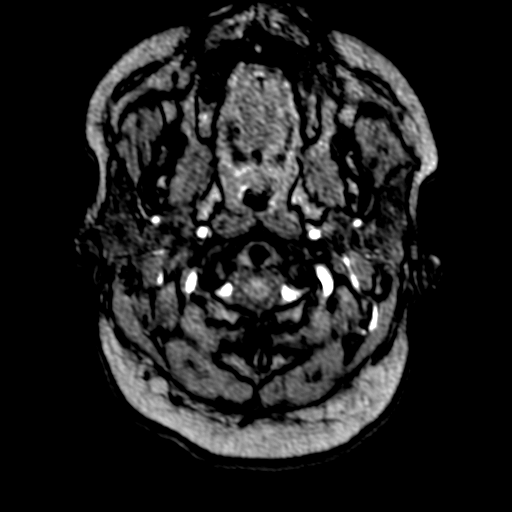
[im 9/205]
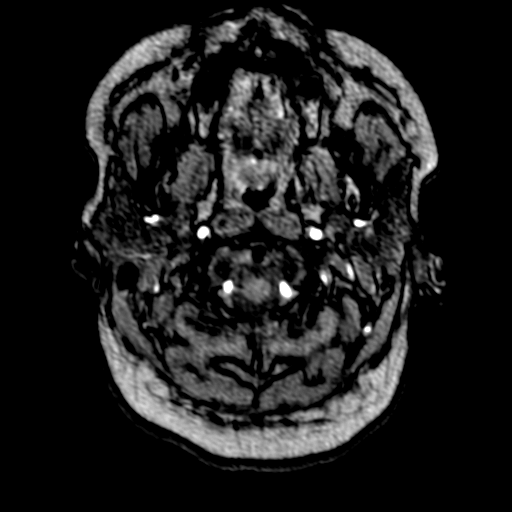
[im 14/205]
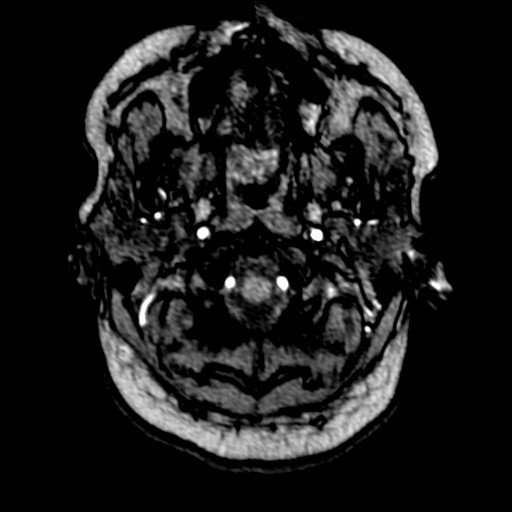
[im 18/205]
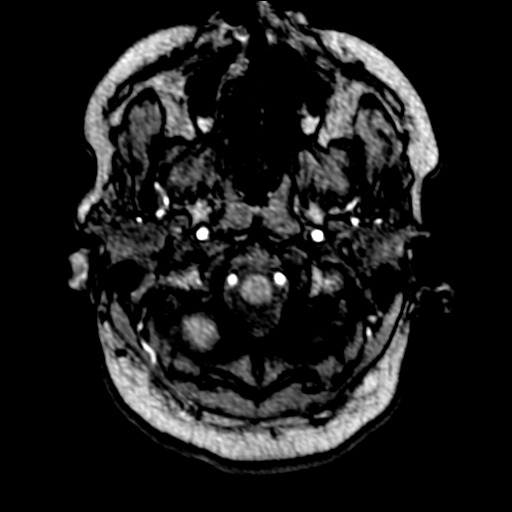
[im 22/205]
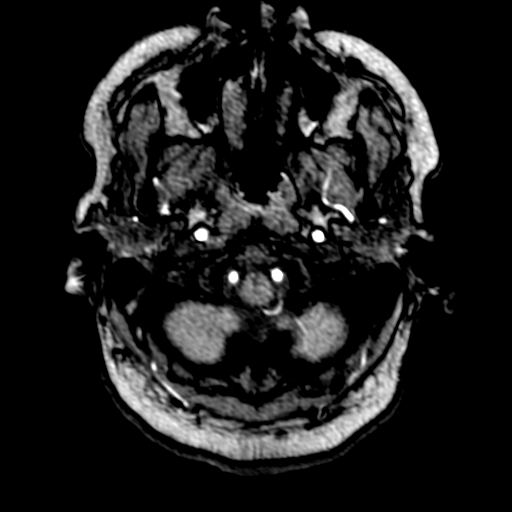
[im 27/205]
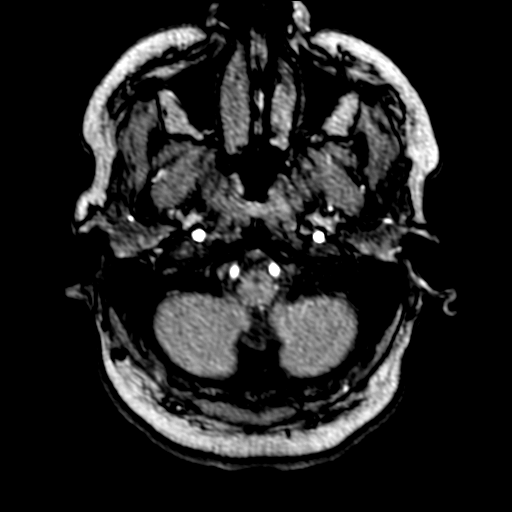
[im 31/205]
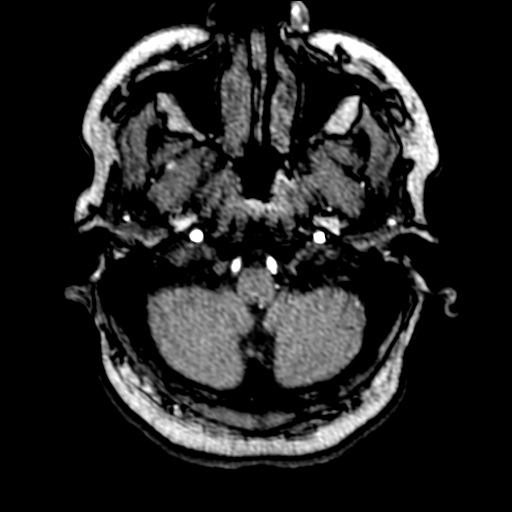
[im 35/205]
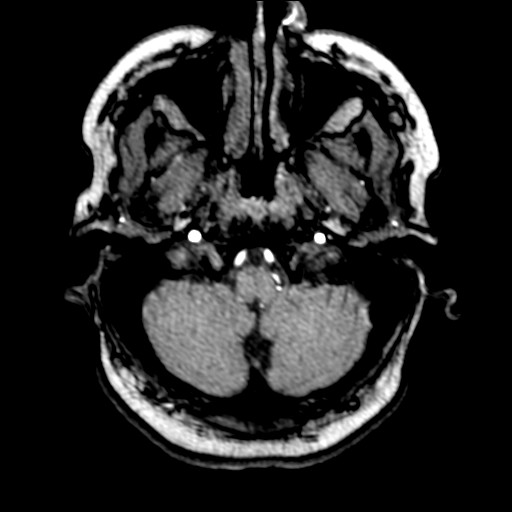
[im 40/205]
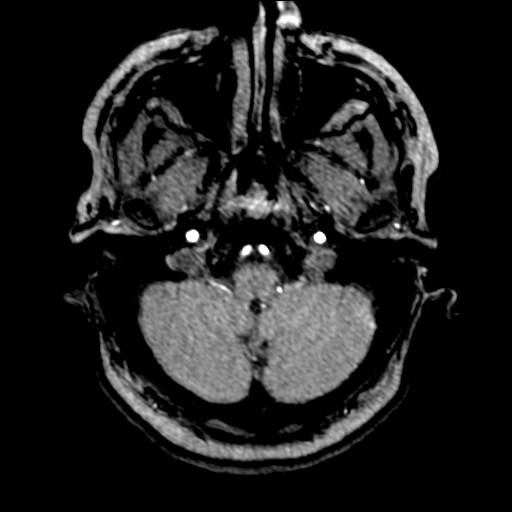
[im 44/205]
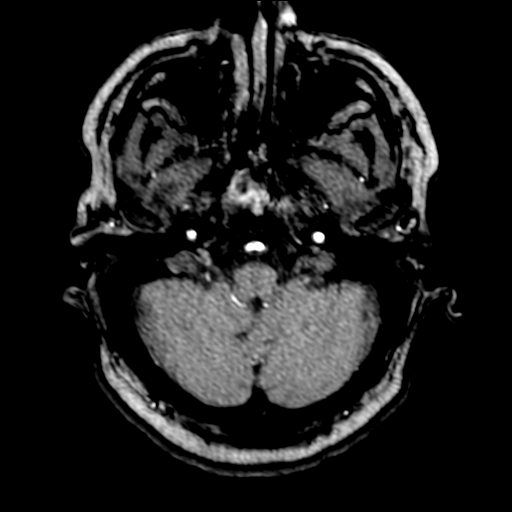
[im 48/205]
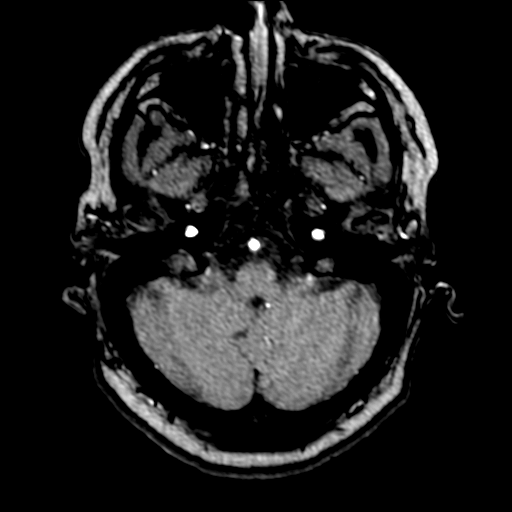
[im 66/205]
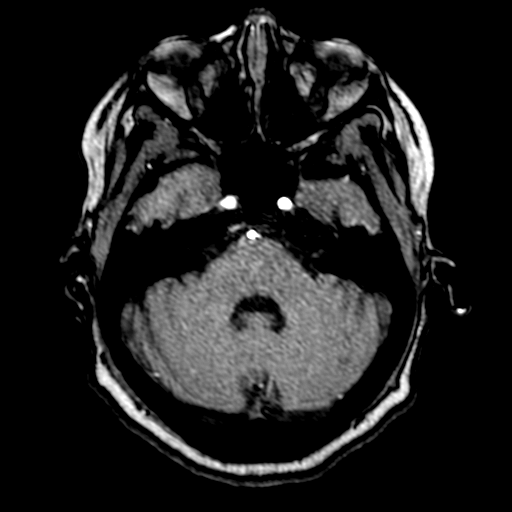
[im 92/205]
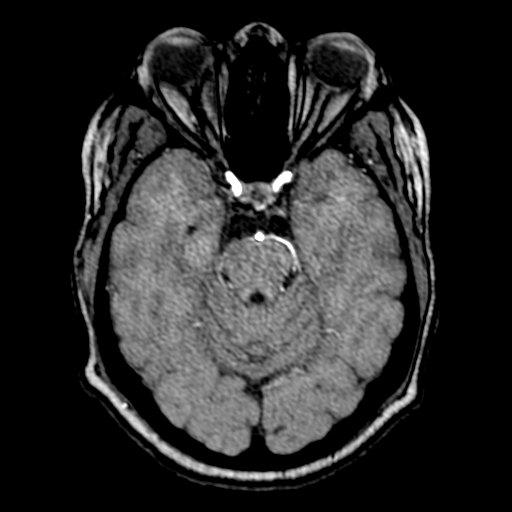
[im 105/205]
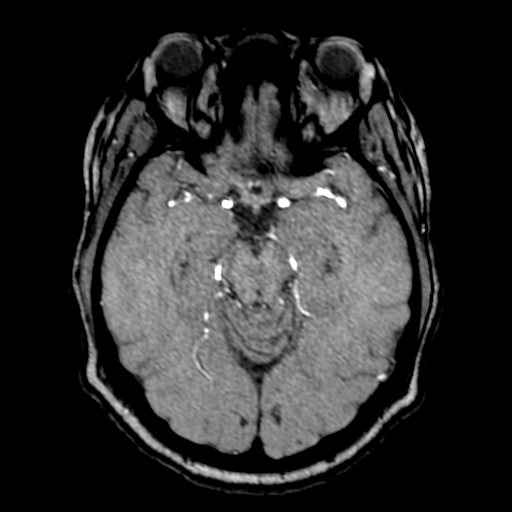
[im 118/205]
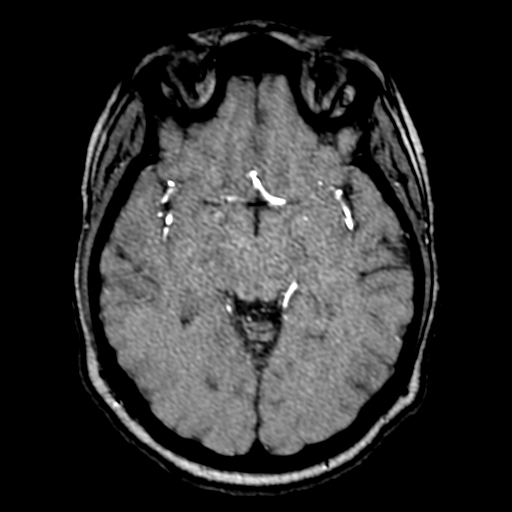
[im 144/205]
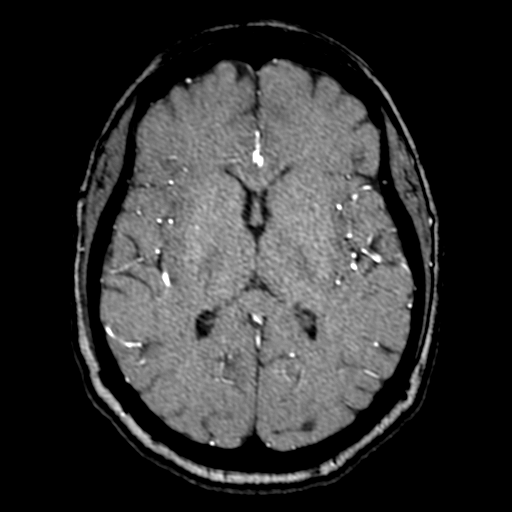
[im 170/205]
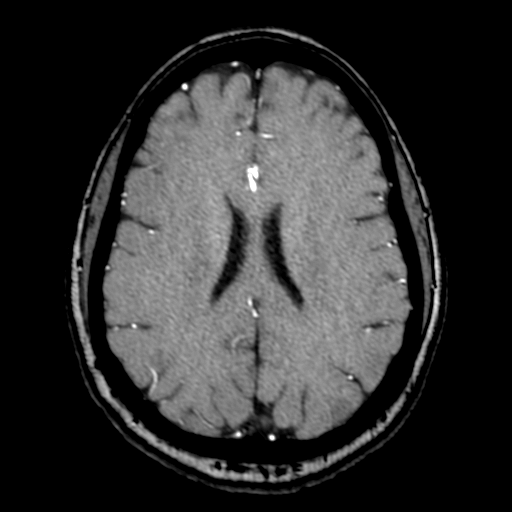
[im 174/205]
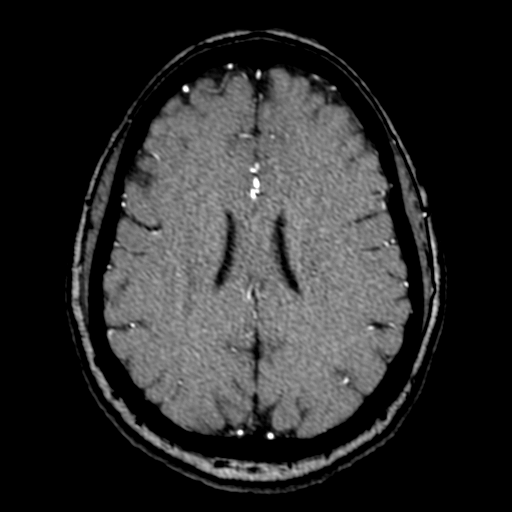
[im 196/205]
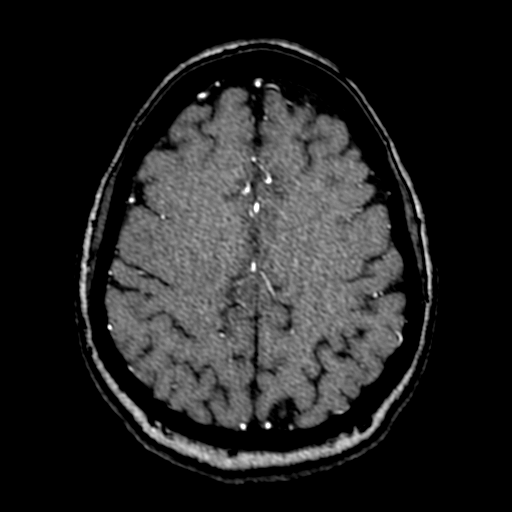

[20 of 48 positions shown; findings below may reference images not displayed]

FINDINGS: Both vertebral arteries patent to the basilar. Left PICA patent.
Right PICA not visualized. AICA patent bilaterally. Basilar widely
patent. Superior cerebellar and posterior cerebral arteries patent
bilaterally.

Internal carotid artery white the patent bilaterally. Anterior and
middle cerebral arteries widely patent bilaterally without stenosis

Negative for aneurysm or vascular malformation.
IMPRESSION: Negative MRA head

## 2021-06-12 ENCOUNTER — Ambulatory Visit
Admission: EM | Admit: 2021-06-12 | Discharge: 2021-06-12 | Disposition: A | Discharge status: Home / Self Care | Payer: Commercial Managed Care - PPO | Attending: Family Medicine | Admitting: Family Medicine

## 2021-06-12 ENCOUNTER — Other Ambulatory Visit: Payer: Self-pay

## 2021-06-12 DIAGNOSIS — Z885 Allergy status to narcotic agent status: Secondary | ICD-10-CM | POA: Diagnosis not present

## 2021-06-12 DIAGNOSIS — R059 Cough, unspecified: Secondary | ICD-10-CM | POA: Insufficient documentation

## 2021-06-12 DIAGNOSIS — B9789 Other viral agents as the cause of diseases classified elsewhere: Secondary | ICD-10-CM | POA: Diagnosis not present

## 2021-06-12 DIAGNOSIS — Z793 Long term (current) use of hormonal contraceptives: Secondary | ICD-10-CM | POA: Insufficient documentation

## 2021-06-12 DIAGNOSIS — J988 Other specified respiratory disorders: Secondary | ICD-10-CM

## 2021-06-12 DIAGNOSIS — Z20822 Contact with and (suspected) exposure to covid-19: Secondary | ICD-10-CM | POA: Diagnosis not present

## 2021-06-12 DIAGNOSIS — Z79899 Other long term (current) drug therapy: Secondary | ICD-10-CM | POA: Diagnosis not present

## 2021-06-12 LAB — RESP PANEL BY RT-PCR (FLU A&B, COVID) ARPGX2
Influenza A by PCR: NEGATIVE
Influenza B by PCR: NEGATIVE
SARS Coronavirus 2 by RT PCR: NEGATIVE

## 2021-06-12 NOTE — Discharge Instructions (Addendum)
I will call with the results.  Take care  Dr. Rogelio Waynick  

## 2021-06-12 NOTE — ED Provider Notes (Signed)
[facility]    CSN: [id number] Arrival date & time: 06/12/21  1106      History   Chief Complaint Chief Complaint  Patient presents with   Cough   Headache   Nasal Congestion   HPI  40 year old female presents with respiratory symptoms.  Patient reports that her symptoms started yesterday.  She reports headache, body aches, sore throat, congestion, cough.  No fever.  No sick contacts at home.  Patient does state that she was notified from her child's daycare that they have had several cases of COVID-19.  She desires testing today.  Pain currently 6/10 in severity.  She has been taking over-the-counter medication without resolution.  No other complaints at this time.  Past Medical History:  Diagnosis Date   Anemia    Hypertension    Preterm labor     Patient Active Problem List   Diagnosis Date Noted   NSVT (nonsustained ventricular tachycardia) (HCC)    Chronic headaches 02/06/2020   Recurrent syncope 02/06/2020   Occipital neuralgia 02/06/2020   Sinus tachycardia 02/06/2020   ANA positive 02/06/2020   Syncope 02/06/2020   Iron deficiency anemia 10/28/2019   Pregnancy 08/04/2018   Indication for care in labor or delivery 08/03/2018   Labor and delivery indication for care or intervention 08/02/2018   Chronic hypertension with superimposed pre-eclampsia 07/30/2018   Chronic hypertension with superimposed preeclampsia 07/21/2018   Short interval between pregnancies affecting pregnancy in first trimester, antepartum 03/18/2018   Advanced maternal age in multigravida, first trimester 03/18/2018   Fetal demise, greater than 22 weeks, antepartum, single gestation 11/13/2017    Past Surgical History:  Procedure Laterality Date   NO PAST SURGERIES      OB History     Gravida  6   Para  6   Term  4   Preterm  1   AB      Living  5      SAB      IAB      Ectopic      Multiple  0   Live Births  1            Home Medications     Prior to Admission medications   Medication Sig Start Date End Date Taking? Authorizing Provider  AJOVY 225 MG/1.5ML SOAJ Inject into the skin. 03/21/20  Yes [provider]  albuterol (VENTOLIN HFA) 108 (90 Base) MCG/ACT inhaler Inhale 1 puff into the lungs every 6 (six) hours as needed for wheezing or shortness of breath.   Yes [provider]  Butalbital-APAP-Caffeine 50-300-40 MG CAPS Take 1 capsule by mouth every 4 (four) hours as needed. 03/11/21  Yes [provider]  ferrous sulfate 220 (44 Fe) MG/5ML solution Take 220 mg by mouth 2 (two) times daily with a meal.  11/16/19  Yes [provider]  Vitamin D, Ergocalciferol, (DRISDOL) 1.25 MG (50000 UNIT) CAPS capsule Take 50,000 Units by mouth once a week. 01/31/20  Yes [provider]  loratadine (CLARITIN) 10 MG tablet Take 10 mg by mouth daily. 02/03/20 05/13/20  [provider]  acetaminophen (TYLENOL) 500 MG tablet Take 500 mg by mouth every 6 (six) hours as needed.    [provider]  buPROPion (WELLBUTRIN XL) 150 MG 24 hr tablet Take 150 mg by mouth daily. 12/21/19   [provider]  ibuprofen (ADVIL) 600 MG tablet Take 1 tablet (600 mg total) by mouth every 6 (six) hours as needed. 05/13/20  Melynda Ripple, MD  medroxyPROGESTERone (DEPO-PROVERA) 150 MG/ML injection Inject 150 mg into the muscle every 3 (three) months.    [provider]  Multiple Vitamin (MULTI-VITAMIN DAILY PO) Take 1 tablet by mouth daily in the afternoon.     [provider]  norgestimate-ethinyl estradiol (SPRINTEC 28) 0.25-35 MG-MCG tablet TAKE 1 TABLET BY MOUTH 3 TIMES A DAY X 3 DAYS, THEN 1 TABLET 2 TIMES A DAY, THEN ONE TABLET DAILY 07/31/20   [provider]  nortriptyline (PAMELOR) 10 MG capsule TAKE 1 CAPSULE AT BEDTIME FOR 1 WEEK THEN INCREASE TO 2 CAPS AT BEDTIME 12/30/19   [provider]  traZODone (DESYREL) 50 MG tablet Take 1 tablet by mouth daily in  the afternoon. 02/03/20   [provider]  zonisamide (ZONEGRAN) 100 MG capsule Take 100 mg by mouth at bedtime. 02/02/20   [provider]  [name] 150-35 MCG/24HR transdermal patch 1 patch once a week. 09/04/19 11/18/19  [provider]    Family History Family History  Problem Relation Age of Onset   Hypertension Mother    Alcohol abuse Mother    Liver disease Mother    Other Father        unknown medical history   Hypertension Maternal Grandmother    Diabetes Maternal Grandmother    Dementia Maternal Grandmother    Autoimmune disease Daughter     Social History Social History   Tobacco Use   Smoking status: Never   Smokeless tobacco: Never  Vaping Use   Vaping Use: Never used  Substance Use Topics   Alcohol use: Yes    Comment: occassional   Drug use: No     Allergies   Hydrocodone-acetaminophen and Vicodin [hydrocodone-acetaminophen]   Review of Systems Review of Systems Per HPI  Physical Exam Triage Vital Signs ED Triage Vitals  Enc Vitals Group     BP 06/12/21 1138 131/85     Pulse Rate 06/12/21 1138 79     Resp 06/12/21 1138 18     Temp 06/12/21 1138 98.5 F (36.9 C)     Temp Source 06/12/21 1138 Oral     SpO2 06/12/21 1138 100 %     Weight 06/12/21 1133 190 lb (86.2 kg)     Height 06/12/21 1133 '5\' 1"'$  (1.549 m)     Head Circumference --      Peak Flow --      Pain Score 06/12/21 1133 6     Pain Loc --      Pain Edu? --      Excl. in [place]? --    Updated Vital Signs BP 131/85 (BP Location: Left Arm)   Pulse 79   Temp 98.5 F (36.9 C) (Oral)   Resp 18   Ht '5\' 1"'$  (1.549 m)   Wt 86.2 kg   LMP 06/10/2021   SpO2 100%   BMI 35.90 kg/m   Visual Acuity Right Eye Distance:   Left Eye Distance:   Bilateral Distance:    Right Eye Near:   Left Eye Near:    Bilateral Near:     Physical Exam Constitutional:      General: She is not in acute distress.    Appearance: Normal appearance. She is well-developed. She is  obese. She is not ill-appearing.  HENT:     Head: Normocephalic and atraumatic.     Right Ear: Tympanic membrane normal.     Left Ear: Tympanic membrane normal.     Mouth/Throat:  Pharynx: Oropharynx is clear. No oropharyngeal exudate or posterior oropharyngeal erythema.  Cardiovascular:     Rate and Rhythm: Normal rate and regular rhythm.     Heart sounds: No murmur heard. Pulmonary:     Effort: Pulmonary effort is normal.     Breath sounds: Normal breath sounds. No wheezing, rhonchi or rales.  Neurological:     Mental Status: She is alert.  Psychiatric:        Mood and Affect: Mood normal.        Behavior: Behavior normal.     UC Treatments / Results  Labs (all labs ordered are listed, but only abnormal results are displayed) Labs Reviewed  RESP PANEL BY RT-PCR (FLU A&B, COVID) [id number]    EKG   Radiology No results found.  Procedures Procedures (including critical care time)  Medications Ordered in UC Medications - No data to display  Initial Impression / Assessment and Plan / UC Course  I have reviewed the triage vital signs and the nursing notes.  Pertinent labs & imaging results that were available during my care of the patient were reviewed by me and considered in my medical decision making (see chart for details).    40 year old female presents with a viral respiratory infection.  COVID and flu testing negative.  Supportive care.  May return to work tomorrow.  Final Clinical Impressions(s) / UC Diagnoses   Final diagnoses:  Viral respiratory infection     Discharge Instructions      I will call with the results.  Take care  Dr. Lacinda Axon    ED Prescriptions   None    PDMP not reviewed this encounter.   Coral Spikes, [place] 06/12/21 1237

## 2021-06-12 NOTE — ED Triage Notes (Signed)
Pt c/o states she hasn't been feeling well since yesterday. Pt reports her son's daycare has several cases of COVID. Pt reports headache, bodyaches, dry throat, nasal congestion and slight cough. Pt denies f/n/v/d or other symptoms.

## 2021-07-15 ENCOUNTER — Encounter: Payer: Self-pay | Admitting: Emergency Medicine

## 2021-07-15 ENCOUNTER — Ambulatory Visit
Admission: EM | Admit: 2021-07-15 | Discharge: 2021-07-15 | Disposition: A | Discharge status: Home / Self Care | Payer: Commercial Managed Care - PPO | Attending: Physician Assistant | Admitting: Physician Assistant

## 2021-07-15 ENCOUNTER — Other Ambulatory Visit: Payer: Self-pay

## 2021-07-15 DIAGNOSIS — H6593 Unspecified nonsuppurative otitis media, bilateral: Secondary | ICD-10-CM | POA: Insufficient documentation

## 2021-07-15 DIAGNOSIS — Z20822 Contact with and (suspected) exposure to covid-19: Secondary | ICD-10-CM | POA: Insufficient documentation

## 2021-07-15 DIAGNOSIS — R0981 Nasal congestion: Secondary | ICD-10-CM | POA: Insufficient documentation

## 2021-07-15 DIAGNOSIS — R059 Cough, unspecified: Secondary | ICD-10-CM | POA: Insufficient documentation

## 2021-07-15 LAB — SARS CORONAVIRUS 2 (TAT 6-24 HRS): SARS Coronavirus 2: NEGATIVE

## 2021-07-15 LAB — GROUP A STREP BY PCR: Group A Strep by PCR: NOT DETECTED

## 2021-07-15 NOTE — ED Provider Notes (Signed)
[facility]    CSN: [id number] Arrival date & time: 07/15/21  0934      History   Chief Complaint Chief Complaint  Patient presents with   Cough    HPI Cindy Hays is a 40 y.o. female.   Patient is a 40 year old female who presents with complaint of body aches that lasted for a day starting on Friday.  She also has nasal drainage, scratchy throat, tender lymph nodes on both sides of her neck, and a fullness in her ears.  Patient states she works [employer] and department at [employer] and is in contact with the patient's but they were all wearing masks.  She does report she has been vaccinated for COVID as well as received a booster.  Patient states she has taken some Sudafed and Mucinex with some help.   Past Medical History:  Diagnosis Date   Anemia    Hypertension    Preterm labor     Patient Active Problem List   Diagnosis Date Noted   NSVT (nonsustained ventricular tachycardia) (HCC)    Chronic headaches 02/06/2020   Recurrent syncope 02/06/2020   Occipital neuralgia 02/06/2020   Sinus tachycardia 02/06/2020   ANA positive 02/06/2020   Syncope 02/06/2020   Iron deficiency anemia 10/28/2019   Pregnancy 08/04/2018   Indication for care in labor or delivery 08/03/2018   Labor and delivery indication for care or intervention 08/02/2018   Chronic hypertension with superimposed pre-eclampsia 07/30/2018   Chronic hypertension with superimposed preeclampsia 07/21/2018   Short interval between pregnancies affecting pregnancy in first trimester, antepartum 03/18/2018   Advanced maternal age in multigravida, first trimester 03/18/2018   Fetal demise, greater than 22 weeks, antepartum, single gestation 11/13/2017    Past Surgical History:  Procedure Laterality Date   NO PAST SURGERIES      OB History     Gravida  6   Para  6   Term  4   Preterm  1   AB      Living  5      SAB      IAB      Ectopic      Multiple  0   Live Births  1             Home Medications    Prior to Admission medications   Medication Sig Start Date End Date Taking? Authorizing Provider  Butalbital-APAP-Caffeine 50-300-40 MG CAPS Take 1 capsule by mouth every 4 (four) hours as needed. 03/11/21  Yes [provider]  Multiple Vitamin (MULTI-VITAMIN DAILY PO) Take 1 tablet by mouth daily in the afternoon.    Yes [provider]  loratadine (CLARITIN) 10 MG tablet Take 10 mg by mouth daily. 02/03/20 05/13/20  [provider]  acetaminophen (TYLENOL) 500 MG tablet Take 500 mg by mouth every 6 (six) hours as needed.    [provider]  AJOVY 225 MG/1.5ML SOAJ Inject into the skin. 03/21/20   [provider]  albuterol (VENTOLIN HFA) 108 (90 Base) MCG/ACT inhaler Inhale 1 puff into the lungs every 6 (six) hours as needed for wheezing or shortness of breath.    [provider]  buPROPion (WELLBUTRIN XL) 150 MG 24 hr tablet Take 150 mg by mouth daily. 12/21/19   [provider]  ferrous sulfate 220 (44 Fe) MG/5ML solution Take 220 mg by mouth 2 (two) times daily with a meal.  11/16/19   [provider]  ibuprofen (ADVIL) 600 MG tablet  Take 1 tablet (600 mg total) by mouth every 6 (six) hours as needed. 05/13/20   Melynda Ripple, MD  medroxyPROGESTERone (DEPO-PROVERA) 150 MG/ML injection Inject 150 mg into the muscle every 3 (three) months.    [provider]  norgestimate-ethinyl estradiol (ORTHO-CYCLEN) 0.25-35 MG-MCG tablet TAKE 1 TABLET BY MOUTH 3 TIMES A DAY X 3 DAYS, THEN 1 TABLET 2 TIMES A DAY, THEN ONE TABLET DAILY 07/31/20   [provider]  nortriptyline (PAMELOR) 10 MG capsule TAKE 1 CAPSULE AT BEDTIME FOR 1 WEEK THEN INCREASE TO 2 CAPS AT BEDTIME 12/30/19   [provider]  traZODone (DESYREL) 50 MG tablet Take 1 tablet by mouth daily in the afternoon. 02/03/20   [provider]  Vitamin D, Ergocalciferol, (DRISDOL) 1.25 MG (50000 UNIT) CAPS capsule Take  50,000 Units by mouth once a week. 01/31/20   [provider]  zonisamide (ZONEGRAN) 100 MG capsule Take 100 mg by mouth at bedtime. 02/02/20   [provider]  [name] 150-35 MCG/24HR transdermal patch 1 patch once a week. 09/04/19 11/18/19  [provider]    Family History Family History  Problem Relation Age of Onset   Hypertension Mother    Alcohol abuse Mother    Liver disease Mother    Other Father        unknown medical history   Hypertension Maternal Grandmother    Diabetes Maternal Grandmother    Dementia Maternal Grandmother    Autoimmune disease Daughter     Social History Social History   Tobacco Use   Smoking status: Never   Smokeless tobacco: Never  Vaping Use   Vaping Use: Never used  Substance Use Topics   Alcohol use: Yes    Comment: occassional   Drug use: No     Allergies   Hydrocodone-acetaminophen and Vicodin [hydrocodone-acetaminophen]   Review of Systems Review of Systems as above in HPI.  Other systems reviewed and found to be negative   Physical Exam Triage Vital Signs ED Triage Vitals  Enc Vitals Group     BP 07/15/21 0956 129/90     Pulse Rate 07/15/21 0956 73     Resp 07/15/21 0956 18     Temp 07/15/21 0956 98.3 F (36.8 C)     Temp Source 07/15/21 0956 Oral     SpO2 07/15/21 0956 99 %     Weight 07/15/21 0953 190 lb 0.6 oz (86.2 kg)     Height 07/15/21 0953 '5\' 1"'$  (1.549 m)     Head Circumference --      Peak Flow --      Pain Score 07/15/21 0953 6     Pain Loc --      Pain Edu? --      Excl. in [place]? --    No data found.  Updated Vital Signs BP 129/90 (BP Location: Left Arm)   Pulse 73   Temp 98.3 F (36.8 C) (Oral)   Resp 18   Ht '5\' 1"'$  (1.549 m)   Wt 190 lb 0.6 oz (86.2 kg)   LMP 07/01/2021 (Approximate)   SpO2 99%   BMI 35.91 kg/m   Visual Acuity Right Eye Distance:   Left Eye Distance:   Bilateral Distance:    Right Eye Near:   Left Eye Near:    Bilateral Near:     Physical  Exam Constitutional:      Appearance: Normal appearance.  HENT:     Head: Normocephalic and atraumatic.  Right Ear: Hearing and ear canal normal. A middle ear effusion is present. Tympanic membrane is not injected, erythematous or bulging.     Left Ear: Hearing normal. A middle ear effusion is present. Tympanic membrane is not injected, erythematous or bulging.     Nose:     Right Sinus: No maxillary sinus tenderness or frontal sinus tenderness.     Left Sinus: No maxillary sinus tenderness or frontal sinus tenderness.     Mouth/Throat:     Lips: Pink.     Mouth: Mucous membranes are moist.     Pharynx: Oropharynx is clear. Uvula midline. No posterior oropharyngeal erythema.     Tonsils: No tonsillar exudate.  Cardiovascular:     Rate and Rhythm: Normal rate and regular rhythm.     Pulses: Normal pulses.     Heart sounds: Normal heart sounds.  Pulmonary:     Effort: Pulmonary effort is normal.     Breath sounds: Normal breath sounds.  Musculoskeletal:     Cervical back: Normal range of motion.  Lymphadenopathy:     Cervical: Cervical adenopathy present.  Skin:    General: Skin is dry.  Neurological:     General: No focal deficit present.     Mental Status: She is alert and oriented to person, place, and time.     UC Treatments / Results  Labs (all labs ordered are listed, but only abnormal results are displayed) Labs Reviewed  GROUP A STREP BY PCR  SARS CORONAVIRUS 2 (TAT 6-24 HRS)    EKG   Radiology No results found.  Procedures Procedures (including critical care time)  Medications Ordered in UC Medications - No data to display  Initial Impression / Assessment and Plan / UC Course  I have reviewed the triage vital signs and the nursing notes.  Pertinent labs & imaging results that were available during my care of the patient were reviewed by me and considered in my medical decision making (see chart for details).    Patient with upper respiratory  complaints.  Patient does work in the [employer] medical and has been vaccinated inclusive.  Strep test was negative.  COVID test is pending.  Patient with history of seasonal allergies in the past.  Symptoms likely related to seasonal allergies and congestion.  Recommend Flonase in the morning and oral allergy medicine at night.  Also recommend nasal rinse.  No indication for antibiotic at this time. Final Clinical Impressions(s) / UC Diagnoses   Final diagnoses:  Congestion of nasal sinus  Fluid level behind tympanic membrane of both ears     Discharge Instructions      -Recommend continue Flonase to both nares daily.  Recommend adding a oral allergy medicine at night -Nasal saline rinse and/or Nettie pot like product to help clear out the nasal and sinuses passage to allow clearance for the ears to drain -Ibuprofen or Tylenol as needed for pain -Follow-up with clinic or primary care as needed -Would isolate until COVID test returns.     ED Prescriptions   None    PDMP not reviewed this encounter.   Luvenia Redden, PA-C 07/15/21 1057

## 2021-07-15 NOTE — ED Triage Notes (Signed)
Pt c/o cough, sinus pressure, sore throat and body aches. Started about 3 days ago. Denies fever.

## 2021-07-15 NOTE — Discharge Instructions (Addendum)
-  Recommend continue Flonase to both nares daily.  Recommend adding a oral allergy medicine at night -Nasal saline rinse and/or Nettie pot like product to help clear out the nasal and sinuses passage to allow clearance for the ears to drain -Ibuprofen or Tylenol as needed for pain -Follow-up with clinic or primary care as needed -Would isolate until COVID test returns.

## 2021-10-28 ENCOUNTER — Other Ambulatory Visit: Payer: Self-pay | Admitting: Obstetrics and Gynecology

## 2021-11-06 ENCOUNTER — Other Ambulatory Visit: Payer: Self-pay | Admitting: Obstetrics and Gynecology

## 2021-11-12 ENCOUNTER — Ambulatory Visit: Admit: 2021-11-12 | Payer: Commercial Managed Care - PPO

## 2021-11-13 ENCOUNTER — Ambulatory Visit: Admit: 2021-11-13 | Discharge status: Absent without leave | Payer: Commercial Managed Care - PPO

## 2021-11-18 ENCOUNTER — Ambulatory Visit: Admit: 2021-11-18 | Payer: Commercial Managed Care - PPO

## 2021-12-03 NOTE — H&P (Signed)
Cindy Hays is a 41 y.o. female presenting with Pre Op Consulting (Sign consents)   History of Present Illness: Pt returns for surgical evaluation. She has a hx of uterine fibroids. Her last ultrasound was 09/2019 for AUB which showed 3 small fibroids. She is currently on depo-provera, and has a hx of continuous OCPs and requested a BTL for permanent sterilization. She is bleeding frequently and her menometrorrhagia is not at goal.   We discussed options for menstrual control: pill, patch, ring, injection, implant, IUD, Lysteda, continuous NSAID's, endometrial ablation and hysterectomy.  The patch, OCPs and depo have not worked to control her bleeding.  For both permanent sterilization and definitive management of her AUB and uterine fibroids, she has chosen hysterectomy.    Today: She is nervous but she is doing well today. Thinks she might have a hemorrhoid.    Workup: -09/2018 Postpartum f/u with Pap: LGSIL with HPV+ ---->pt no-showed colpo  -07/2019 pap LGSIL /+HPV-->colpo 09/2019: [facility]   Pap: collected today  EMBx: collected today  TVUS 10/2021  Uterus=9.48 x 5.13 x 5.48cm Uterus anteverted Endometrium=12.36mm Fibroids seen:1)Rt lateral=2.6cm 2)anterior to endometrium=2.8cm 3)Rt posterior=2.1cm Rt ovary appears wnl, Left ovary appears wnl No free fluid seen   Past Medical History:  has a past medical history of ANA positive, Anemia, Depression, Fibroid, preeclampsia, prior pregnancy, currently pregnant, Hypertension (11/2017), Migraines, Obesity, and Supervision of high risk pregnancy in second trimester (07/23/2017).  Past Surgical History:  has no past surgical history on file. Family History: family history includes Clotting disorder in her child; Diabetes in her maternal grandmother; Diabetes type II in her mother; Heart disease in her maternal grandmother; High blood pressure (Hypertension) in her maternal grandmother and mother; Kidney disease in her mother; Migraines in  her mother; Myocardial Infarction (Heart attack) in her mother. Social History:  reports that she has never smoked. She has never used smokeless tobacco. She reports current alcohol use of about 2.0 standard drinks per week. She reports that she does not use drugs. OB/GYN History:  OB History     Gravida 7   Para 6   Term 3   Preterm 3   AB 1   Living 4     SAB     IAB 1   Ectopic     Molar     Multiple     Live Births 4        Allergies: is allergic to amoxicillin and hydrocodone-acetaminophen. Medications: Current Outpatient Medications:    buPROPion (WELLBUTRIN XL) 150 MG XL tablet, Take 1 tablet (150 mg total) by mouth once daily, Disp: 30 tablet, Rfl: 11   butalbital-acetaminophen-caff 50-300-40 mg Cap, TAKE 1 CAPSULE BY MOUTH EVERY 4 HOURS AS NEEDED, Disp: 30 capsule, Rfl: 11   ibuprofen (MOTRIN) 800 MG tablet, Take 1 tablet (800 mg total) by mouth every 8 (eight) hours as needed for Pain, Disp: 30 tablet, Rfl: 0   levocetirizine (XYZAL) 5 MG tablet, Take 1 tablet (5 mg total) by mouth every evening, Disp: 30 tablet, Rfl: 0   linaCLOtide (LINZESS) 145 mcg capsule, Take 1 capsule (145 mcg total) by mouth once daily, Disp: 30 capsule, Rfl: 2   medroxyPROGESTERone (DEPO-PROVERA) 150 mg/mL injection, INJECT 1 ML (150 MG TOTAL) INTO THE MUSCLE EVERY 3 (THREE) MONTHS, Disp: 1 mL, Rfl: 1   MULTIVITAMIN ORAL, Take 1 tablet by mouth once daily, Disp: , Rfl:    norgestimate-ethinyl estradioL (SPRINTEC 0.25/35, 28,) 0.25-35 mg-mcg tablet, Take 1 tablet by mouth  3 times a day x 3 days, then 1 tablet by mouth 2 times a day x 2 days, then 1 tablet daily until end of pack as estrogen taper, Disp: 28 tablet, Rfl: 0   phentermine (ADIPEX-P) 15 MG capsule, Take 1 capsule (15 mg total) by mouth every morning before breakfast for 30 days, Disp: 30 capsule, Rfl: 0   semaglutide (OZEMPIC) 0.25 mg or 0.5 mg(2 mg/1.5 mL) pen injector, Inject 0.1875 mLs (0.25 mg total)  subcutaneously once a week for 30 days, Disp: 0.75 mL, Rfl: 0   ergocalciferol, vitamin D2, 1,250 mcg (50,000 unit) capsule, Take 1 capsule (50,000 Units total) by mouth once a week for 30 days, Disp: 12 capsule, Rfl: 1   Current Facility-Administered Medications:    cyanocobalamin (VITAMIN B12) injection 1,000 mcg, 1,000 mcg, Intramuscular, Q30 Days, Kalisetti, Radhika, MD, 1,000 mcg at 11/08/21 1031   Review of Systems: No SOB, no palpitations or chest pain, no new lower extremity edema, no nausea or vomiting or bowel or bladder complaints. See HPI for gyn specific ROS.    Exam:   BP 136/85    Pulse 71    Ht 154.9 cm (5\' 1" )    Wt 91 kg (200 lb 9.6 oz)    BMI 37.90 kg/m    General: Patient is well-groomed, well-nourished, appears stated age in no acute distress   HEENT: head is atraumatic and normocephalic, trachea is midline, neck is supple with no palpable nodules   CV: Regular rhythm and normal heart rate, no murmur   Pulm: Clear to auscultation throughout lung fields with no wheezing, crackles, or rhonchi. No increased work of breathing   Abdomen: soft , no mass, non-tender, no rebound tenderness, no hepatomegaly   Pelvic: tanner stage 5 ,              External genitalia: vulva /labia no lesions             Urethra: no prolapse             Vagina: normal physiologic d/c, laxity in vaginal walls             Cervix: no lesions, no cervical motion tenderness, good descent             Uterus: normal size shape and contour, non-tender             Adnexa: no mass,  non-tender               Rectovaginal: +external hemorrhoid, non-engorged but tender   Pap collected today    Endometrial biopsy: The cervix was cleaned with betadine, topical Hurriciane spray applied, and a single tooth tenaculum is applied to the anterior cervix. The Pipelle catheter was placed into the endometrial cavity. It sounds to 11 cm and adequate tissue was removed. Hemostatic with silver nitrate.   Impression:   The primary encounter diagnosis was Abnormal uterine bleeding (AUB). Diagnoses of Excessive or frequent menstruation, History of uterine fibroid, Unwanted fertility, Iron deficiency anemia due to chronic blood loss, Encounter for cervical Pap smear with pelvic exam, and Screening for cervical cancer were also pertinent to this visit.   Plan:   1. AUB, Fibroid Uterus, Unwanted fertility  - EMBx and pap smear today - Referral to hematology for IV iron. Hx of anemia. Last Hgb 8.9 in 9/20222 - Patient returns for a preoperative discussion regarding her plans to proceed with surgical treatment of her AUB, Menorrhagia and Fibroid uterus by Robotically assisted total  laparoscopic hysterectomy with bilateral salpingectomy procedure.  We may perform a cystoscopy to evaluate the urinary tract after the procedure, if surgically indicated for uro tract integrity.    The patient and I discussed the technical aspects of the procedure including the potential for risks and complications.  These include but are not limited to the risk of infection requiring post-operative antibiotics or further procedures.  We talked about the risk of injury to adjacent organs including bladder, bowel, ureter, blood vessels or nerves.  We talked about the need to convert to an open incision.  We talked about the possible need for blood transfusion.  We talked about postop complications such as thromboembolic or cardiopulmonary complications.  All of her questions were answered.  Her preoperative exam was completed and the appropriate consents were signed. She is scheduled to undergo this procedure in the near future.   Specific Peri-operative Considerations:  - Consent: obtained today - Health Maintenance: hx of anemia: IV iron prior to surgery.  - Labs: CBC, CMP preoperatively - Studies: EKG, CXR preoperatively - Bowel Preparation: None required - Abx:  Ancef 2g - VTE ppx: SCDs perioperatively - Glucose  Protocol:  - Beta-blockade:    - Hemorrhoid  - External hemorrhoid present on exam, discussed with pt that hemorrhoid not large enough for repair. Can refer if desired.   - Sent Anusol cream  - Recommended tuck pads  - Handouts provided    Diagnoses and all orders for this visit:   Abnormal uterine bleeding (AUB) -     Ambulatory Referral to Hematology-Benign -     IGP, Aptima HPV - [facility] -     Pathology Report - [facility]   Excessive or frequent menstruation -     Ambulatory Referral to Hematology-Benign -     IGP, Aptima HPV - [facility] -     Pathology Report - [facility]   History of uterine fibroid -     Pathology Report - [facility]   Unwanted fertility   Iron deficiency anemia due to chronic blood loss -     Ambulatory Referral to Hematology-Benign   Encounter for cervical Pap smear with pelvic exam -     IGP, Aptima HPV - [facility]   Screening for cervical cancer -     IGP, Aptima HPV - [facility]     Return for Postop check.

## 2021-12-04 ENCOUNTER — Other Ambulatory Visit: Payer: Commercial Managed Care - PPO

## 2021-12-05 ENCOUNTER — Encounter: Payer: Self-pay | Admitting: *Deleted

## 2021-12-10 ENCOUNTER — Other Ambulatory Visit
Admission: RE | Admit: 2021-12-10 | Discharge: 2021-12-10 | Disposition: A | Discharge status: Home / Self Care | Payer: Commercial Managed Care - PPO | Source: Ambulatory Visit | Attending: Obstetrics and Gynecology | Admitting: Obstetrics and Gynecology

## 2021-12-10 ENCOUNTER — Other Ambulatory Visit: Payer: Self-pay

## 2021-12-10 NOTE — Patient Instructions (Signed)
Your procedure is scheduled on: Friday December 20, 2021. Report to Day Surgery inside [facility] 2nd floor.  To find out your arrival time please call [phone] between 1PM - 3PM on Thursday December 19, 2021.  Remember: Instructions that are not followed completely may result in serious medical risk,  up to and including death, or upon the discretion of your surgeon and anesthesiologist your  surgery may need to be rescheduled.     _X__ 1. Do not eat food after midnight the night before your procedure.                 No chewing gum or hard candies. You may drink clear liquids up to 2 hours                 before you are scheduled to arrive for your surgery- DO not drink clear                 liquids within 2 hours of the start of your surgery.                 Clear Liquids include:  water, apple juice without pulp, clear Gatorade, G2 or                  Gatorade Zero (avoid Red/Purple/Blue), Black Coffee or Tea (Do not add                 anything to coffee or tea).  __X__2.  On the morning of surgery brush your teeth with toothpaste and water, you                may rinse your mouth with mouthwash if you wish.  Do not swallow any toothpaste or mouthwash.     _X__ 3.  No Alcohol for 24 hours before or after surgery.   _X__ 4.  Do Not Smoke or use e-cigarettes For 24 Hours Prior to Your Surgery.                 Do not use any chewable tobacco products for at least 6 hours prior to                 Surgery.  _X__  5.  Do not use any recreational drugs (marijuana, cocaine, heroin, ecstasy, MDMA or other)                For at least one week prior to your surgery.  Combination of these drugs with anesthesia                May have life threatening results.  ____  6.  Bring all medications with you on the day of surgery if instructed.   __X__7.  Notify your doctor if there is any change in your medical condition      (cold, fever, infections).     Do not  wear jewelry, make-up, hairpins, clips or nail polish. Do not wear lotions, powders, or perfumes. You may wear deodorant. Do not shave 48 hours prior to surgery.  Do not bring valuables to the hospital.    [facility] is not responsible for any belongings or valuables.  Contacts, dentures or bridgework may not be worn into surgery. Leave your suitcase in the car. After surgery it may be brought to your room. For patients admitted to the hospital, discharge time is determined by your treatment team.   Patients discharged the day of surgery will not  be allowed to drive home.   Make arrangements for someone to be with you for the first 24 hours of your Same Day Discharge.   __X__ Take these medicines the morning of surgery with A SIP OF WATER:    1. None   2.   3.   4.  5.  6.  ____ Fleet Enema (as directed)   __X__ Use CHG Soap (or wipes) as directed  ____ Use Benzoyl Peroxide Gel as instructed  ____ Use inhalers on the day of surgery  ____ Stop metformin 2 days prior to surgery    ____ Take 1/2 of usual insulin dose the night before surgery. No insulin the morning          of surgery.   ____ Call your PCP, cardiologist, or Pulmonologist if taking Coumadin/Plavix/aspirin and ask when to stop before your surgery.   __X__ One Week prior to surgery- Stop Anti-inflammatories such as Ibuprofen, Aleve, Advil, Motrin, meloxicam (MOBIC), diclofenac, etodolac, ketorolac, Toradol, Daypro, piroxicam, Goody's or BC powders. OK TO USE TYLENOL IF NEEDED   __X__ Stop supplements until after surgery.    ____ Bring C-Pap to the hospital.    If you have any questions regarding your pre-procedure instructions,  Please call Pre-admit Testing at [phone]

## 2021-12-11 ENCOUNTER — Other Ambulatory Visit
Admission: RE | Admit: 2021-12-11 | Discharge: 2021-12-11 | Disposition: A | Discharge status: Home / Self Care | Payer: Commercial Managed Care - PPO | Source: Ambulatory Visit | Attending: Obstetrics and Gynecology | Admitting: Obstetrics and Gynecology

## 2021-12-11 ENCOUNTER — Inpatient Hospital Stay: Payer: Commercial Managed Care - PPO

## 2021-12-11 ENCOUNTER — Inpatient Hospital Stay: Payer: Commercial Managed Care - PPO | Admitting: Oncology

## 2021-12-11 DIAGNOSIS — D250 Submucous leiomyoma of uterus: Secondary | ICD-10-CM | POA: Insufficient documentation

## 2021-12-11 DIAGNOSIS — Z01812 Encounter for preprocedural laboratory examination: Secondary | ICD-10-CM | POA: Insufficient documentation

## 2021-12-11 DIAGNOSIS — D252 Subserosal leiomyoma of uterus: Secondary | ICD-10-CM | POA: Insufficient documentation

## 2021-12-11 LAB — CBC
HCT: 30.9 % — ABNORMAL LOW (ref 36.0–46.0)
Hemoglobin: 9.6 g/dL — ABNORMAL LOW (ref 12.0–15.0)
MCH: 23.1 pg — ABNORMAL LOW (ref 26.0–34.0)
MCHC: 31.1 g/dL (ref 30.0–36.0)
MCV: 74.5 fL — ABNORMAL LOW (ref 80.0–100.0)
Platelets: 376 10*3/uL (ref 150–400)
RBC: 4.15 MIL/uL (ref 3.87–5.11)
RDW: 17.2 % — ABNORMAL HIGH (ref 11.5–15.5)
WBC: 5.8 10*3/uL (ref 4.0–10.5)
nRBC: 0 % (ref 0.0–0.2)

## 2021-12-11 LAB — BASIC METABOLIC PANEL
Anion gap: 7 (ref 5–15)
BUN: 8 mg/dL (ref 6–20)
CO2: 24 mmol/L (ref 22–32)
Calcium: 9.1 mg/dL (ref 8.9–10.3)
Chloride: 104 mmol/L (ref 98–111)
Creatinine, Ser: 0.67 mg/dL (ref 0.44–1.00)
GFR, Estimated: >60 mL/min (ref >60)
Glucose, Bld: 95 mg/dL (ref 70–99)
Potassium: 3.5 mmol/L (ref 3.5–5.1)
Sodium: 135 mmol/L (ref 135–145)

## 2021-12-13 ENCOUNTER — Inpatient Hospital Stay: Payer: Commercial Managed Care - PPO

## 2021-12-13 ENCOUNTER — Inpatient Hospital Stay: Payer: Commercial Managed Care - PPO | Attending: Oncology | Admitting: Oncology

## 2021-12-20 ENCOUNTER — Other Ambulatory Visit: Payer: Self-pay

## 2021-12-20 ENCOUNTER — Ambulatory Visit: Payer: Commercial Managed Care - PPO | Admitting: Urgent Care

## 2021-12-20 ENCOUNTER — Encounter: Payer: Self-pay | Admitting: Obstetrics and Gynecology

## 2021-12-20 ENCOUNTER — Encounter
Admission: RE | Disposition: A | Discharge status: Home / Self Care | Payer: Self-pay | Source: Home / Self Care | Attending: Obstetrics and Gynecology

## 2021-12-20 ENCOUNTER — Ambulatory Visit: Payer: Commercial Managed Care - PPO | Admitting: Anesthesiology

## 2021-12-20 ENCOUNTER — Ambulatory Visit
Admission: RE | Admit: 2021-12-20 | Discharge: 2021-12-20 | Disposition: A | Discharge status: Home / Self Care | Payer: Commercial Managed Care - PPO | Attending: Obstetrics and Gynecology | Admitting: Obstetrics and Gynecology

## 2021-12-20 DIAGNOSIS — N871 Moderate cervical dysplasia: Secondary | ICD-10-CM | POA: Diagnosis not present

## 2021-12-20 DIAGNOSIS — N921 Excessive and frequent menstruation with irregular cycle: Secondary | ICD-10-CM | POA: Diagnosis not present

## 2021-12-20 DIAGNOSIS — D252 Subserosal leiomyoma of uterus: Secondary | ICD-10-CM

## 2021-12-20 DIAGNOSIS — Z302 Encounter for sterilization: Secondary | ICD-10-CM | POA: Insufficient documentation

## 2021-12-20 DIAGNOSIS — Z793 Long term (current) use of hormonal contraceptives: Secondary | ICD-10-CM | POA: Insufficient documentation

## 2021-12-20 DIAGNOSIS — D259 Leiomyoma of uterus, unspecified: Secondary | ICD-10-CM | POA: Insufficient documentation

## 2021-12-20 DIAGNOSIS — R519 Headache, unspecified: Secondary | ICD-10-CM | POA: Insufficient documentation

## 2021-12-20 DIAGNOSIS — N920 Excessive and frequent menstruation with regular cycle: Secondary | ICD-10-CM | POA: Insufficient documentation

## 2021-12-20 DIAGNOSIS — D250 Submucous leiomyoma of uterus: Secondary | ICD-10-CM

## 2021-12-20 DIAGNOSIS — N8003 Adenomyosis of the uterus: Secondary | ICD-10-CM | POA: Diagnosis not present

## 2021-12-20 HISTORY — PX: ROBOTIC ASSISTED LAPAROSCOPIC HYSTERECTOMY AND SALPINGECTOMY: SHX6379

## 2021-12-20 LAB — POCT PREGNANCY, URINE: Preg Test, Ur: NEGATIVE

## 2021-12-20 SURGERY — XI ROBOTIC ASSISTED LAPAROSCOPIC HYSTERECTOMY AND SALPINGECTOMY
Anesthesia: General | Laterality: Bilateral

## 2021-12-20 MED ORDER — DEXMEDETOMIDINE (PRECEDEX) IN NS 20 MCG/5ML (4 MCG/ML) IV SYRINGE
PREFILLED_SYRINGE | INTRAVENOUS | Status: DC | PRN
  Administered 2021-12-20: 8 ug via INTRAVENOUS
  Administered 2021-12-20: 4 ug via INTRAVENOUS
  Administered 2021-12-20: 8 ug via INTRAVENOUS

## 2021-12-20 MED ORDER — FENTANYL CITRATE (PF) 100 MCG/2ML IJ SOLN
INTRAMUSCULAR | Status: AC
  Filled 2021-12-20: qty 2

## 2021-12-20 MED ORDER — LIDOCAINE HCL (CARDIAC) PF 100 MG/5ML IV SOSY
PREFILLED_SYRINGE | INTRAVENOUS | Status: DC | PRN
  Administered 2021-12-20: 100 mg via INTRAVENOUS

## 2021-12-20 MED ORDER — ROCURONIUM BROMIDE 100 MG/10ML IV SOLN
INTRAVENOUS | Status: DC | PRN
  Administered 2021-12-20: 60 mg via INTRAVENOUS
  Administered 2021-12-20: 10 mg via INTRAVENOUS
  Administered 2021-12-20: 20 mg via INTRAVENOUS

## 2021-12-20 MED ORDER — LIDOCAINE HCL (PF) 2 % IJ SOLN
INTRAMUSCULAR | Status: AC
  Filled 2021-12-20: qty 5

## 2021-12-20 MED ORDER — OXYCODONE HCL 5 MG PO TABS: 5.0000 mg | ORAL_TABLET | ORAL | Status: DC | PRN

## 2021-12-20 MED ORDER — ROCURONIUM BROMIDE 10 MG/ML (PF) SYRINGE
PREFILLED_SYRINGE | INTRAVENOUS | Status: AC
  Filled 2021-12-20: qty 10

## 2021-12-20 MED ORDER — BUPIVACAINE HCL (PF) 0.5 % IJ SOLN
INTRAMUSCULAR | Status: DC | PRN
  Administered 2021-12-20: 6 mL

## 2021-12-20 MED ORDER — FAMOTIDINE 20 MG PO TABS
20.0000 mg | ORAL_TABLET | Freq: Once | ORAL | Status: AC
Start: 2021-12-20 — End: 2021-12-20

## 2021-12-20 MED ORDER — DEXAMETHASONE SODIUM PHOSPHATE 10 MG/ML IJ SOLN
INTRAMUSCULAR | Status: AC
  Filled 2021-12-20: qty 1

## 2021-12-20 MED ORDER — GABAPENTIN 800 MG PO TABS: 800.0000 mg | ORAL_TABLET | Freq: Every day | ORAL | 0 refills | Status: DC

## 2021-12-20 MED ORDER — HEMOSTATIC AGENTS (NO CHARGE) OPTIME
TOPICAL | Status: DC | PRN
  Administered 2021-12-20: 1 via TOPICAL

## 2021-12-20 MED ORDER — SUGAMMADEX SODIUM 200 MG/2ML IV SOLN
INTRAVENOUS | Status: DC | PRN
  Administered 2021-12-20: 200 mg via INTRAVENOUS

## 2021-12-20 MED ORDER — DOCUSATE SODIUM 100 MG PO CAPS: 100.0000 mg | ORAL_CAPSULE | Freq: Two times a day (BID) | ORAL | 0 refills | Status: DC

## 2021-12-20 MED ORDER — FENTANYL CITRATE (PF) 100 MCG/2ML IJ SOLN
25.0000 ug | INTRAMUSCULAR | Status: DC | PRN
  Administered 2021-12-20: 25 ug via INTRAVENOUS

## 2021-12-20 MED ORDER — KETAMINE HCL 10 MG/ML IJ SOLN
INTRAMUSCULAR | Status: DC | PRN
Start: 2021-12-20 — End: 2021-12-20
  Administered 2021-12-20: 20 mg via INTRAVENOUS

## 2021-12-20 MED ORDER — OXYCODONE HCL 5 MG PO TABS
ORAL_TABLET | ORAL | Status: AC
  Administered 2021-12-20: 5 mg via ORAL
  Filled 2021-12-20: qty 1

## 2021-12-20 MED ORDER — GLYCOPYRROLATE 0.2 MG/ML IJ SOLN
INTRAMUSCULAR | Status: DC | PRN
  Administered 2021-12-20: .2 mg via INTRAVENOUS

## 2021-12-20 MED ORDER — CEFAZOLIN SODIUM-DEXTROSE 2-4 GM/100ML-% IV SOLN
INTRAVENOUS | Status: AC
  Filled 2021-12-20: qty 100

## 2021-12-20 MED ORDER — GABAPENTIN 300 MG PO CAPS: 300.0000 mg | ORAL_CAPSULE | ORAL | Status: AC

## 2021-12-20 MED ORDER — BUPIVACAINE HCL (PF) 0.5 % IJ SOLN
INTRAMUSCULAR | Status: AC
  Filled 2021-12-20: qty 30

## 2021-12-20 MED ORDER — ONDANSETRON HCL 4 MG/2ML IJ SOLN
INTRAMUSCULAR | Status: DC | PRN
  Administered 2021-12-20: 4 mg via INTRAVENOUS

## 2021-12-20 MED ORDER — KETOROLAC TROMETHAMINE 30 MG/ML IJ SOLN
INTRAMUSCULAR | Status: DC | PRN
  Administered 2021-12-20: 30 mg via INTRAVENOUS

## 2021-12-20 MED ORDER — CHLORHEXIDINE GLUCONATE 0.12 % MT SOLN
OROMUCOSAL | Status: AC
  Administered 2021-12-20: 15 mL via OROMUCOSAL
  Filled 2021-12-20: qty 15

## 2021-12-20 MED ORDER — KETOROLAC TROMETHAMINE 30 MG/ML IJ SOLN
INTRAMUSCULAR | Status: AC
  Filled 2021-12-20: qty 1

## 2021-12-20 MED ORDER — MIDAZOLAM HCL 2 MG/2ML IJ SOLN
INTRAMUSCULAR | Status: DC | PRN
  Administered 2021-12-20 (×2): 1 mg via INTRAVENOUS

## 2021-12-20 MED ORDER — FAMOTIDINE 20 MG PO TABS
ORAL_TABLET | ORAL | Status: AC
  Administered 2021-12-20: 20 mg via ORAL
  Filled 2021-12-20: qty 1

## 2021-12-20 MED ORDER — LACTATED RINGERS IV SOLN: INTRAVENOUS | Status: DC

## 2021-12-20 MED ORDER — ACETAMINOPHEN 500 MG PO TABS: 1000.0000 mg | ORAL_TABLET | Freq: Four times a day (QID) | ORAL | 0 refills | Status: AC

## 2021-12-20 MED ORDER — ONDANSETRON HCL 4 MG/2ML IJ SOLN
INTRAMUSCULAR | Status: AC
  Filled 2021-12-20: qty 2

## 2021-12-20 MED ORDER — CEFAZOLIN SODIUM-DEXTROSE 2-4 GM/100ML-% IV SOLN
2.0000 g | INTRAVENOUS | Status: AC
  Administered 2021-12-20: 2 g via INTRAVENOUS

## 2021-12-20 MED ORDER — POVIDONE-IODINE 10 % EX SWAB: 2.0000 "application " | Freq: Once | CUTANEOUS | Status: DC

## 2021-12-20 MED ORDER — PROPOFOL 10 MG/ML IV BOLUS
INTRAVENOUS | Status: AC
  Filled 2021-12-20: qty 20

## 2021-12-20 MED ORDER — PROPOFOL 10 MG/ML IV BOLUS
INTRAVENOUS | Status: DC | PRN
Start: 2021-12-20 — End: 2021-12-20
  Administered 2021-12-20: 170 mg via INTRAVENOUS
  Administered 2021-12-20: 30 mg via INTRAVENOUS

## 2021-12-20 MED ORDER — OXYCODONE HCL 5 MG PO TABS: 5.0000 mg | ORAL_TABLET | ORAL | 0 refills | Status: DC | PRN

## 2021-12-20 MED ORDER — MIDAZOLAM HCL 2 MG/2ML IJ SOLN
INTRAMUSCULAR | Status: AC
  Filled 2021-12-20: qty 2

## 2021-12-20 MED ORDER — KETAMINE HCL 50 MG/5ML IJ SOSY
PREFILLED_SYRINGE | INTRAMUSCULAR | Status: AC
  Filled 2021-12-20: qty 5

## 2021-12-20 MED ORDER — HYDROMORPHONE HCL 1 MG/ML IJ SOLN
INTRAMUSCULAR | Status: DC | PRN
  Administered 2021-12-20 (×2): .5 mg via INTRAVENOUS

## 2021-12-20 MED ORDER — FENTANYL CITRATE (PF) 100 MCG/2ML IJ SOLN
INTRAMUSCULAR | Status: DC | PRN
  Administered 2021-12-20 (×2): 50 ug via INTRAVENOUS

## 2021-12-20 MED ORDER — CHLORHEXIDINE GLUCONATE 0.12 % MT SOLN: 15.0000 mL | Freq: Once | OROMUCOSAL | Status: AC

## 2021-12-20 MED ORDER — ORAL CARE MOUTH RINSE: 15.0000 mL | Freq: Once | OROMUCOSAL | Status: AC

## 2021-12-20 MED ORDER — ACETAMINOPHEN 500 MG PO TABS
ORAL_TABLET | ORAL | Status: AC
  Administered 2021-12-20: 1000 mg via ORAL
  Filled 2021-12-20: qty 2

## 2021-12-20 MED ORDER — 0.9 % SODIUM CHLORIDE (POUR BTL) OPTIME
TOPICAL | Status: DC | PRN
  Administered 2021-12-20: 200 mL

## 2021-12-20 MED ORDER — ACETAMINOPHEN 500 MG PO TABS: 1000.0000 mg | ORAL_TABLET | ORAL | Status: AC

## 2021-12-20 MED ORDER — PHENYLEPHRINE HCL-NACL 20-0.9 MG/250ML-% IV SOLN
INTRAVENOUS | Status: DC | PRN
  Administered 2021-12-20: 35 ug/min via INTRAVENOUS

## 2021-12-20 MED ORDER — DEXAMETHASONE SODIUM PHOSPHATE 10 MG/ML IJ SOLN
INTRAMUSCULAR | Status: DC | PRN
  Administered 2021-12-20: 10 mg via INTRAVENOUS

## 2021-12-20 MED ORDER — PHENYLEPHRINE HCL-NACL 20-0.9 MG/250ML-% IV SOLN
INTRAVENOUS | Status: AC
  Filled 2021-12-20: qty 250

## 2021-12-20 MED ORDER — HYDROMORPHONE HCL 1 MG/ML IJ SOLN
INTRAMUSCULAR | Status: AC
  Filled 2021-12-20: qty 1

## 2021-12-20 MED ORDER — GABAPENTIN 300 MG PO CAPS
ORAL_CAPSULE | ORAL | Status: AC
  Administered 2021-12-20: 300 mg via ORAL
  Filled 2021-12-20: qty 1

## 2021-12-20 MED ORDER — IBUPROFEN 800 MG PO TABS: 800.0000 mg | ORAL_TABLET | Freq: Three times a day (TID) | ORAL | 1 refills | Status: AC

## 2021-12-20 MED ORDER — PHENYLEPHRINE 40 MCG/ML (10ML) SYRINGE FOR IV PUSH (FOR BLOOD PRESSURE SUPPORT)
PREFILLED_SYRINGE | INTRAVENOUS | Status: DC | PRN
  Administered 2021-12-20: 80 ug via INTRAVENOUS

## 2021-12-20 SURGICAL SUPPLY — 68 items
ADH SKN CLS APL DERMABOND .7 (GAUZE/BANDAGES/DRESSINGS) ×1
APL PRP STRL LF DISP 70% ISPRP (MISCELLANEOUS) ×1
APL SRG 38 LTWT LNG FL B (MISCELLANEOUS) ×1
APPLICATOR ARISTA FLEXITIP XL (MISCELLANEOUS) ×1 IMPLANT
BACTOSHIELD CHG 4% 4OZ (MISCELLANEOUS) ×1
BAG DRN RND TRDRP ANRFLXCHMBR (UROLOGICAL SUPPLIES) ×1
BAG URINE DRAIN 2000ML AR STRL (UROLOGICAL SUPPLIES) ×2 IMPLANT
BLADE SURG SZ11 CARB STEEL (BLADE) ×2 IMPLANT
CANNULA CAP OBTURATR AIRSEAL 8 (CAP) ×2 IMPLANT
CATH FOLEY 2WAY  5CC 16FR (CATHETERS) ×1
CATH FOLEY 2WAY 5CC 16FR (CATHETERS) ×1
CATH URTH 16FR FL 2W BLN LF (CATHETERS) ×1 IMPLANT
CHLORAPREP W/TINT 26 (MISCELLANEOUS) ×2 IMPLANT
COVER TIP SHEARS 8 DVNC (MISCELLANEOUS) ×1 IMPLANT
COVER TIP SHEARS 8MM DA VINCI (MISCELLANEOUS) ×1
DERMABOND ADVANCED (GAUZE/BANDAGES/DRESSINGS) ×1
DERMABOND ADVANCED .7 DNX12 (GAUZE/BANDAGES/DRESSINGS) ×1 IMPLANT
DRAPE 3/4 80X56 (DRAPES) ×4 IMPLANT
DRAPE ARM DVNC X/XI (DISPOSABLE) ×4 IMPLANT
DRAPE COLUMN DVNC XI (DISPOSABLE) ×1 IMPLANT
DRAPE DA VINCI XI ARM (DISPOSABLE) ×4
DRAPE DA VINCI XI COLUMN (DISPOSABLE) ×1
ELECT REM PT RETURN 9FT ADLT (ELECTROSURGICAL) ×2
ELECTRODE REM PT RTRN 9FT ADLT (ELECTROSURGICAL) ×1 IMPLANT
GAUZE 4X4 16PLY ~~LOC~~+RFID DBL (SPONGE) ×4 IMPLANT
GLOVE SURG ENC MOIS LTX SZ7 (GLOVE) ×8 IMPLANT
GLOVE SURG SYN 7.0 (GLOVE) ×2 IMPLANT
GLOVE SURG SYN 7.0 PF PI (GLOVE) ×1 IMPLANT
GLOVE SURG UNDER LTX SZ7.5 (GLOVE) ×8 IMPLANT
GLOVE SURG UNDER POLY LF SZ7.5 (GLOVE) ×2 IMPLANT
GOWN STRL REUS W/ TWL LRG LVL3 (GOWN DISPOSABLE) ×8 IMPLANT
GOWN STRL REUS W/TWL LRG LVL3 (GOWN DISPOSABLE) ×16
GRASPER SUT TROCAR 14GX15 (MISCELLANEOUS) ×2 IMPLANT
HEMOSTAT ARISTA ABSORB 3G PWDR (HEMOSTASIS) ×1 IMPLANT
IRRIGATION STRYKERFLOW (MISCELLANEOUS) IMPLANT
IRRIGATOR STRYKERFLOW (MISCELLANEOUS) ×2
IV NS 1000ML (IV SOLUTION) ×2
IV NS 1000ML BAXH (IV SOLUTION) IMPLANT
KIT PINK PAD W/HEAD ARE REST (MISCELLANEOUS) ×2
KIT PINK PAD W/HEAD ARM REST (MISCELLANEOUS) ×1 IMPLANT
KIT TURNOVER CYSTO (KITS) ×2 IMPLANT
LABEL OR SOLS (LABEL) ×2 IMPLANT
MANIFOLD NEPTUNE II (INSTRUMENTS) ×2 IMPLANT
MANIPULATOR VCARE SML CRV RETR (MISCELLANEOUS) ×1 IMPLANT
MANIPULATOR VCARE STD CRV RETR (MISCELLANEOUS) IMPLANT
NS IRRIG 1000ML POUR BTL (IV SOLUTION) ×2 IMPLANT
OBTURATOR OPTICAL STANDARD 8MM (TROCAR) ×1
OBTURATOR OPTICAL STND 8 DVNC (TROCAR) ×1
OBTURATOR OPTICALSTD 8 DVNC (TROCAR) ×1 IMPLANT
OCCLUDER COLPOPNEUMO (BALLOONS) ×2 IMPLANT
PACK GYN LAPAROSCOPIC (MISCELLANEOUS) ×2 IMPLANT
PAD OB MATERNITY 4.3X12.25 (PERSONAL CARE ITEMS) ×2 IMPLANT
PAD PREP 24X41 OB/GYN DISP (PERSONAL CARE ITEMS) ×2 IMPLANT
SCRUB CHG 4% DYNA-HEX 4OZ (MISCELLANEOUS) ×1 IMPLANT
SEAL CANN UNIV 5-8 DVNC XI (MISCELLANEOUS) ×3 IMPLANT
SEAL XI 5MM-8MM UNIVERSAL (MISCELLANEOUS) ×3
SEALER VESSEL DA VINCI XI (MISCELLANEOUS) ×1
SEALER VESSEL EXT DVNC XI (MISCELLANEOUS) ×1 IMPLANT
SET TUBE FILTERED XL AIRSEAL (SET/KITS/TRAYS/PACK) ×2 IMPLANT
SOLUTION ELECTROLUBE (MISCELLANEOUS) ×2 IMPLANT
SURGILUBE 2OZ TUBE FLIPTOP (MISCELLANEOUS) ×2 IMPLANT
SUT MNCRL 4-0 (SUTURE) ×4
SUT MNCRL 4-0 27XMFL (SUTURE) ×2
SUT VIC AB 0 CT2 27 (SUTURE) ×4 IMPLANT
SUT VICRYL 0 AB UR-6 (SUTURE) ×1 IMPLANT
SUT VLOC 90 2/L VL 12 GS22 (SUTURE) ×2 IMPLANT
SUTURE MNCRL 4-0 27XMF (SUTURE) ×1 IMPLANT
WATER STERILE IRR 500ML POUR (IV SOLUTION) ×2 IMPLANT

## 2021-12-20 NOTE — Anesthesia Procedure Notes (Signed)
Procedure Name: Intubation Date/Time: 12/20/2021 7:41 AM Performed by: Loletha Grayer, CRNA Pre-anesthesia Checklist: Patient identified, Patient being monitored, Timeout performed, Emergency Drugs available and Suction available Patient Re-evaluated:Patient Re-evaluated prior to induction Oxygen Delivery Method: Circle system utilized Preoxygenation: Pre-oxygenation with 100% oxygen Induction Type: IV induction Ventilation: Mask ventilation without difficulty Laryngoscope Size: 3 and McGraph Grade View: Grade I Tube type: Oral Tube size: 7.0 mm Number of attempts: 1 Airway Equipment and Method: Stylet Placement Confirmation: ETT inserted through vocal cords under direct vision, positive ETCO2 and breath sounds checked- equal and bilateral Secured at: 21 cm Tube secured with: Tape Dental Injury: Teeth and Oropharynx as per pre-operative assessment

## 2021-12-20 NOTE — Discharge Instructions (Addendum)
Discharge instructions after  robotically-assisted total laparoscopic hysterectomy   For the next three days, take ibuprofen and acetaminophen on a schedule, every 8 hours. You can take them together or you can intersperse them, and take one every four hours. I also gave you gabapentin for nighttime, to help you sleep and also to control pain. Take gabapentin medicines at night for at least the next 3 nights. You also have a narcotic, oxycodone, to take as needed if the above medicines don't help. (This is the narcotic in percocet, which is different from vicodin. They are similar, so watch your symptoms.)  Postop constipation is a major cause of pain. Stay well hydrated, walk as you tolerate, and take over the counter senna as well as stool softeners if you need them.   Signs and Symptoms to Report Call our office at [phone] if you have any of the following.   Fever over 100.4 degrees or higher  Severe stomach pain not relieved with pain medications  Bright red bleeding thats heavier than a period that does not slow with rest  To go the bathroom a lot (frequency), you cant hold your urine (urgency), or it hurts when you empty your bladder (urinate)  Chest pain  Shortness of breath  Pain in the calves of your legs  Severe nausea and vomiting not relieved with anti-nausea medications  Signs of infection around your wounds, such as redness, hot to touch, swelling, green/yellow drainage (like pus), bad smelling discharge  Any concerns  What You Can Expect after Surgery  You may see some pink tinged, bloody fluid and bruising around the wound. This is normal.  You may notice shoulder and neck pain. This is caused by the gas used during surgery to expand your abdomen so your surgeon could get to the uterus easier.  You may have a sore throat because of the tube in your mouth during general anesthesia. This will go away in 2 to 3 days.  You may have some stomach cramps.  You may notice  spotting on your panties.  You may have pain around the incision sites.   Activities after Your Discharge Follow these guidelines to help speed your recovery at home:  Do the coughing and deep breathing as you did in the hospital for 2 weeks. Use the small blue breathing device, called the incentive spirometer for 2 weeks.  Dont drive if you are in pain or taking narcotic pain medicine. You may drive when you can safely slam on the brakes, turn the wheel forcefully, and rotate your torso comfortably. This is typically 1-2 weeks. Practice in a parking lot or side street prior to attempting to drive regularly.   Ask others to help with household chores for 4 weeks.  Do not lift anything heavier that 10 pounds for 4-6 weeks. This includes pets, children, and groceries.  Dont do strenuous activities, exercises, or sports like vacuuming, tennis, squash, etc. until your doctor says it is safe to do so. ---Maintain pelvic rest for 12 weeks. This means nothing in the vagina or rectum at all (no douching, tampons, intercourse) for 12 weeks.   Walk as you feel able. Rest often since it may take two or three weeks for your energy level to return to normal.   You may climb stairs  Avoid constipation:   -Eat fruits, vegetables, and whole grains. Eat small meals as your appetite will take time to return to normal.   -Drink 6 to 8 glasses of water  each day unless your doctor has told you to limit your fluids.   -Use a laxative or stool softener as needed if constipation becomes a problem. You may take Miralax, metamucil, Citrucil, Colace, Senekot, FiberCon, etc. If this does not relieve the constipation, try two tablespoons of Milk Of Magnesia every 8 hours until your bowels move.   You may shower. Gently wash the wounds with a mild soap and water. Pat dry.  Do not get in a hot tub, swimming pool, etc. for 6 weeks.  Do not use lotions, oils, powders on the wounds.  Do not douche, use tampons, or have sex  until your doctor says it is okay.  Take your pain medicine when you need it. The medicine may not work as well if the pain is bad.  Take the medicines you were taking before surgery. Other medications you will need are pain medications (Norco or Percocet) and nausea medications (Zofran).     Here is a helpful article from the website [employer], regarding constipation  Here are reasons why constipation occurs after surgery: 1) During the operation and in the recovery room, most people are given opioid pain medication, primarily through an IV, to treat moderate or severe pain. Intravenous opioids include morphine, Dilaudid and fentanyl. After surgery, patients are often prescribed opioid pain medication to take by mouth at home, including codeine, Vicodin, Norco, and Percocet. All of these medications cause constipation by slowing down the movement of your intestine. 2) Changes in your diet before surgery can be another culprit. It is common to get specific instructions to change how you normally eat or drink before your surgery, like only having liquids the day before or not having anything to eat or drink after midnight the night before surgery. For this reason, temporary dehydration may occur. This, along with not eating or only having liquids, means that you are getting less fiber than usual. Both these factors contribute to constipation. 3) Changes in your diet after surgery can also contribute to the problem. Although many people dont have dietary restrictions after operations, being under anesthesia can make you lose your appetite for several hours and maybe even days. Some people can even have nausea or vomiting. Not eating or drinking normally means that you are not getting enough fiber and you can get dehydrated, both leading to constipation. 4) Lying in a bed more than usual--which happens before, during and after surgery--combined with the medications and diet changes, all work together  to slow down your colon and make your poop turn to rock.  No one likes to be constipated.  Lets face it, its not a pleasant feeling when you dont poop for days, then strain on the toilet to finally pass something large enough to cause damage. An ounce of prevention is worth a pound of cure, so: Assume you will be constipated. Plan and prepare accordingly. Post-surgery is one of those unique situations where the temporary use of laxatives can make a world of difference. Always consult with your doctor, and recognize that if you wait several days after surgery to take a laxative, the constipation might be too severe for these over-the-counter options. It is always important to discuss all medications you plan on taking with your doctor. Ask your doctor if you can start the laxative immediately after surgery. *  Here are go-to post-surgery laxatives: Senna: Senna is an herb that acts as a stimulant laxative, meaning it increases the activity of the intestine to cause you to have  a bowel movement. It comes in many forms, but senna pills are easy to take and are sold over the counter at almost all pharmacies. Since opioid pain medications slow down the activity of the intestine, it makes sense to take a medication to help reverse that side effect. Long-term use of a stimulant laxative is not a good idea since it can make your colon lazy and not function properly; however, temporary use immediately after surgery is acceptable. In general, if you are able to eat a normal diet, taking senna soon after surgery works the best. Senna usually works within hours to produce a bowel movement, but this is less predictable when you are taking different medications after surgery. Try not to wait several days to start taking senna, as often it is too late by then. Just like with all medications or supplements, check with your doctor before starting new treatment.   Magnesium: Magnesium is an important mineral that  our body needs. We get magnesium from some foods that we eat, especially foods that are high in fiber such as broccoli, almonds and whole grains. There are also magnesium-based medications used to treat constipation including milk of magnesia (magnesium hydroxide), magnesium citrate and magnesium oxide. They work by drawing water into the intestine, putting it into the class of osmotic laxatives. Magnesium products in low doses appear to be safe, but if taken in very large doses, can lead to problems such as irregular heartbeat, low blood pressure and even death. It can also affect other medications you might be taking, therefore it is important to discuss using magnesium with your physician and pharmacist before initiating therapy. Most over-the-counter magnesium laxatives work very well to help with the constipation related to surgery, but sometimes they work too well and lead to diarrhea. Make sure you are somewhere with easy access to a bathroom, just in case.   Bisacodyl: Bisacodyl (generic name) is sold under brand names such as Dulcolax. Much like senna, it is a stimulant laxative, meaning it makes your intestines move more quickly to push out the stool. This is another good choice to start taking as soon as your doctor says you can take a laxative after surgery. It comes in pill form and as a suppository, which is a good choice for people who cannot or are not allowed to swallow pills. Studies have shown that it works as a laxative, but like most of these medications, you should use this on a short-term basis only.   Enema: Enemas strike fear in many people, but FEAR NOT! Its nowhere near as big a deal as you may think. An enema is just a way to get some liquid into your rectum by placing a specially designed device through your anus. If you have never done one, it might seem like a painful, unpleasant, uncomfortable, complicated and lengthy procedure. But in reality, its simple, takes just a few  seconds and is highly effective. The small ready-made bottles you buy at the pharmacy are much easier than the hose/large rubber container type. Those recommended positions illustrated in some instructions are generally not necessary to place the enema. Its very similar to the insertion of a tampon, requiring a slight squat. Some extra lubrication on the enemas tip (or on your anus) will make it a breeze. In certain cases, there is no substitute for a good enema. For example, if someone has not pooped for a few days, the beginning of the poop waiting to come out can become rock  hard. Passing that hard stool can lead to much pain and problems like anal fissures. Inserting a little liquid to break up the rock-hard stool will help make its passage much easier. Enemas come with different liquids. Most come with saline, but there are also mineral oil options. You can also use warm water in the reusable enema containers. They all work. But since saline can sometimes be irritating, so try a mineral oil or water enema instead.  Here are commonly recommended constipation medications that do not work well for post-surgery constipation: Docusate: Docusate (generic name) most commonly referred to as Colace (brand name) is not really a laxative, but is classified as a stool softener. Although this medication is commonly prescribed, it is not recommended for several reasons: 1) there is no good medical evidence that it works 2) even if it has an effect, which is very questionable, it is minimal and cannot combat the intestinal slowing caused by the opioid medications. Skip docusate to save money and space in your pillbox for something more effective.  PEG: Miralax (brand name) is basically a chemical called polyethylene glycol (PEG) and it has gained tremendous popularity as a laxative. This product is an osmotic laxative meaning it works by pulling water into the stool, making it softer. This is very similar to the  action of natural fiber in foods and supplements. Therefore, the effect seen by this medication is not immediate, causing a bowel movement in a day or more. Is this medication strong enough to battle the constipation related to having an operation? Maybe for some people not prone to constipation. But for most people, other laxatives are better to prevent constipation after surgery. AMBULATORY SURGERY  DISCHARGE INSTRUCTIONS   The drugs that you were given will stay in your system until tomorrow so for the next 24 hours you should not:  Drive an automobile Make any legal decisions Drink any alcoholic beverage   You may resume regular meals tomorrow.  Today it is better to start with liquids and gradually work up to solid foods.  You may eat anything you prefer, but it is better to start with liquids, then soup and crackers, and gradually work up to solid foods.   Please notify your doctor immediately if you have any unusual bleeding, trouble breathing, redness and pain at the surgery site, drainage, fever, or pain not relieved by medication.    Additional Instructions:   Please contact your physician with any problems or Same Day Surgery at [phone], Monday through Friday 6 am to 4 pm, or [facility] number at [phone].

## 2021-12-20 NOTE — Anesthesia Preprocedure Evaluation (Signed)
Anesthesia Evaluation  Patient identified by MRN, date of birth, ID band Patient awake    Reviewed: Allergy & Precautions, NPO status , Patient's Chart, lab work & pertinent test results  History of Anesthesia Complications Negative for: history of anesthetic complications  Airway Mallampati: III  TM Distance: >3 FB Neck ROM: full    Dental  (+) Chipped, Poor Dentition, Missing   Pulmonary neg pulmonary ROS, neg shortness of breath,    Pulmonary exam normal        Cardiovascular Exercise Tolerance: Good hypertension, (-) angina(-) Past MI and (-) DOE Normal cardiovascular exam     Neuro/Psych  Headaches, negative psych ROS   GI/Hepatic negative GI ROS, Neg liver ROS, neg GERD  ,  Endo/Other  negative endocrine ROS  Renal/GU      Musculoskeletal   Abdominal   Peds  Hematology negative hematology ROS (+)   Anesthesia Other Findings Past Medical History: No date: Anemia No date: B12 deficiency No date: History of gestational hypertension No date: Hypertension No date: Preterm labor No date: Uterine leiomyoma  Past Surgical History: No date: NO PAST SURGERIES  BMI    Body Mass Index: 37.79 kg/m      Reproductive/Obstetrics negative OB ROS                             Anesthesia Physical Anesthesia Plan  ASA: 2  Anesthesia Plan: General ETT   Post-op Pain Management:    Induction: Intravenous  PONV Risk Score and Plan: Ondansetron, Dexamethasone, Midazolam and Treatment may vary due to age or medical condition  Airway Management Planned: Oral ETT  Additional Equipment:   Intra-op Plan:   Post-operative Plan: Extubation in OR  Informed Consent: I have reviewed the patients History and Physical, chart, labs and discussed the procedure including the risks, benefits and alternatives for the proposed anesthesia with the patient or authorized representative who has indicated  his/her understanding and acceptance.     Dental Advisory Given  Plan Discussed with: Anesthesiologist, CRNA and Surgeon  Anesthesia Plan Comments: (Patient consented for risks of anesthesia including but not limited to:  - adverse reactions to medications - damage to eyes, teeth, lips or other oral mucosa - nerve damage due to positioning  - sore throat or hoarseness - Damage to heart, brain, nerves, lungs, other parts of body or loss of life  Patient voiced understanding.)        Anesthesia Quick Evaluation

## 2021-12-20 NOTE — Anesthesia Postprocedure Evaluation (Signed)
Anesthesia Post Note  Patient: Dyana Spillane  Procedure(s) Performed: XI ROBOTIC ASSISTED LAPAROSCOPIC HYSTERECTOMY AND SALPINGECTOMY (Bilateral)  Patient location during evaluation: PACU Anesthesia Type: General Level of consciousness: awake and alert Pain management: pain level controlled Vital Signs Assessment: post-procedure vital signs reviewed and stable Respiratory status: spontaneous breathing, nonlabored ventilation, respiratory function stable and patient connected to nasal cannula oxygen Cardiovascular status: blood pressure returned to baseline and stable Postop Assessment: no apparent nausea or vomiting Anesthetic complications: no   No notable events documented.   Last Vitals:  Vitals:   12/20/21 1115 12/20/21 1126  BP:    Pulse: 86 87  Resp: 16 14  Temp:  36.5 C  SpO2: 95% 97%    Last Pain:  Vitals:   12/20/21 1113  TempSrc:   PainSc: 4                  Precious Haws Jamesrobert Ohanesian

## 2021-12-20 NOTE — Op Note (Addendum)
[facility] PROCEDURE DATE: 12/20/2021  PREOPERATIVE DIAGNOSIS: Menorrhagia, fibroid uterus, failed medical management  POSTOPERATIVE DIAGNOSIS: The same PROCEDURE:  XI ROBOTIC ASSISTED total LAPAROSCOPIC HYSTERECTOMY AND bilateral SALPINGECTOMY:   SURGEON:  Dr. Benjaman Kindler, MD ASSISTANT: RNFA Anesthesiologist:  Anesthesiologist: Piscitello, Precious Haws, MD CRNA: Lia Foyer, CRNA; Loletha Grayer, CRNA  INDICATIONS: 41 y.o. [phone]  here for definitive surgical management secondary to the indications listed under preoperative diagnoses; please see preoperative note for further details.  Risks of surgery were discussed with the patient including but not limited to: bleeding which may require transfusion or reoperation; infection which may require antibiotics; injury to bowel, bladder, ureters or other surrounding organs; need for additional procedures; thromboembolic phenomenon, incisional problems and other postoperative/anesthesia complications. Written informed consent was obtained.    FINDINGS:   Pelvic: External genitalia negative for lesions. Vagina negative. Adnexa negative for masses or nodularity. Cervix without gross lesions. Uterus mobile, anteverted, enlarged with multiple fibroids. Right cervical fibroid.  Intraoperative findings revealed a normal upper abdomen including bowel, diaphragmatic surfaces, stomach, and omentum.  The uterus was small and mobile.  The right and left ovaries appeared normal.  Bilateral tubes appeared normal.   ANESTHESIA:    General INTRAVENOUS FLUIDS:800  ml ESTIMATED BLOOD LOSS:25 ml URINE OUTPUT: 1800 ml  SPECIMENS: Uterus, cervix, bilateral fallopian tubes COMPLICATIONS: None immediate   RATLH/BS:  PROCEDURE IN DETAIL: After informed consent was obtained, the patient was taken to the operating room where general anesthesia was obtained without difficulty. The patient was positioned in the dorsal lithotomy position in [place] and her arms were carefully tucked at her sides and the usual precautions were taken. Deep Trendelenburg (20-25 deg) was established to confirm that she does not shift on the table.  She was prepped and draped in normal sterile fashion.  Time-out was performed and a Foley catheter was placed into the bladder. A standard VCare uterine manipulator was then placed in the uterus without incident.  Preoperative prophylactic antibiotics were given through her iv.  After infiltration of local anesthetic at the proposed trocar sites, an 8 mm incision was created at the superior edge of the umbilicus, and an AirSeal 27m was placed under direct visualization. No resistance was met at any layers below the skin. No fascia was encountered, and the peritoneum was not visualized. Pneumoperitoneum was created to a pressure of 12 mm Hg. The camera was placed and the abdomin surveyed, noting intact bowel below the site of entry. A survey of the pelvis and upper abdomen revealed the above findings. Two right and one left lateral 8-mm robotic ports were placed under direct visualization.  The patient was placed in deepTrendelenburg and the bowel was displaced up into the upper abdomen. The robot was left side docked. The instruments were placed under direct visualization.   The ureters were identified bilaterally coursing outside of the operative field. Round ligaments were divided on each side with the EndoShears and the retroperitoneal space was opened bilaterally. The posterior leaflet of the broad was taken down to the level of the IP ligament. The anterior leaflet of the broad ligament was carefully taken down to the midline.  A bladder flap was created and the bladder was dissected down off the lower uterine segment and cervix using endoshears and electrocautery.   The Fallopian tubes were divided from the ovaries, and care taken to hemostatically transect the utero-ovarian ligament. The peritoneum was taken  down to the level of the internal os, and the  uterine arteries skeletonized. With strong cephalad pressure from the V-care, bipolar cautery was used to seal and transect the uterine arteries, and the pedicles allowed to fall away laterally.  A colpotomy was performed circumferentially along the V-Care ring with monopolar electrocautery and the cervix was incised from the vagina using the laparoscopic scissors. The specimen was removed through the vagina.  A pneumo balloon was placed in the vagina and the vaginal cuff was then closed in a running continuous fashion using the  0 V-Lock suture with careful attention to include the vaginal cuff angles, the uterosacral ligaments and the vaginal mucosa within the closure. The left cuff was noted to be bleeding, and was controlled with cautery prior to placing the last stitch, and the suture locked over this site. We placed arista over all surfaces.  Hemostasis was secured with 4mHg intraabdominal pressure and review of all surgical sites. The intraperitoneal pressure was dropped, and all planes of dissection, vascular pedicles and the vaginal cuff were found to be hemostatic.  The robot was undocked. The lateral trocars were removed under visualization.  The umbilical peritoneum was sutured with a figure of eight to protect the underlying bowel from the skin incision. It is possible that fascia was incorporated, but the fascial layer was not definitively identified. The CO2 gas was released and several deep breaths given to remove any remaining CO2 from the peritoneal cavity.  The skin incisions were closed with 4-0 Monocryl subcuticular stitch and Dermabond.    Anesthesia was reversed without difficulty.  The patient tolerated the procedure well.  Sponge, lap and needle counts were correct x2.  The patient was taken to recovery room in excellent condition.

## 2021-12-20 NOTE — Interval H&P Note (Signed)
History and Physical Interval Note:  12/20/2021 7:25 AM  Cindy Hays  has presented today for surgery, with the diagnosis of menometrorrhagia,failed medical management, fibroids.  The various methods of treatment have been discussed with the patient and family. After consideration of risks, benefits and other options for treatment, the patient has consented to  Procedure(s): XI ROBOTIC ASSISTED LAPAROSCOPIC HYSTERECTOMY AND SALPINGECTOMY (Bilateral) as a surgical intervention.  The patient's history has been reviewed, patient examined, no change in status, stable for surgery.  I have reviewed the patient's chart and labs.  Questions were answered to the patient's satisfaction.     Benjaman Kindler

## 2021-12-20 NOTE — Transfer of Care (Signed)
Immediate Anesthesia Transfer of Care Note  Patient: Cindy Hays  Procedure(s) Performed: XI ROBOTIC ASSISTED LAPAROSCOPIC HYSTERECTOMY AND SALPINGECTOMY (Bilateral)  Patient Location: PACU  Anesthesia Type:General  Level of Consciousness: drowsy  Airway & Oxygen Therapy: Patient Spontanous Breathing and Patient connected to face mask oxygen  Post-op Assessment: Report given to RN and Post -op Vital signs reviewed and stable  Post vital signs: Reviewed and stable  Last Vitals:  Vitals Value Taken Time  BP 90/55 12/20/21 1026  Temp 36.6 C 12/20/21 1026  Pulse 80 12/20/21 1029  Resp 18 12/20/21 1029  SpO2 100 % 12/20/21 1029  Vitals shown include unvalidated device data.  Last Pain:  Vitals:   12/20/21 1026  TempSrc:   PainSc: 0-No pain         Complications: No notable events documented.

## 2021-12-21 ENCOUNTER — Telehealth: Payer: Self-pay | Admitting: Obstetrics

## 2021-12-21 NOTE — Telephone Encounter (Signed)
Cindy Hays called the answering service for a call back for post op pain unrelieved by pain medication. Patient states that she is up moving around and taking both her ibuprofen and oxycodone and is still in a lot of pain. I spoke with Dr Leafy Ro and she advised patient to take her 800 ibuprofen and 1000 mg of Tylenol every 6 hours and her Gabapentin every night as well as her Oxycodone as needed and see it she has some pain relief from that regimen. I also encouraged her to drink hot liquids to see if that would help with some of her gas pain. I also told her she could use gas-x as well. Patient to notify me if she has not experienced any relief.   -CT scan will be ordered if needed  Avelino Leeds CNM

## 2021-12-23 LAB — SURGICAL PATHOLOGY

## 2021-12-23 LAB — TYPE AND SCREEN
ABO/RH(D): A POS
Antibody Screen: NEGATIVE
Unit division: 0
Unit division: 0

## 2021-12-23 LAB — BPAM RBC
Blood Product Expiration Date: 202303012359
Blood Product Expiration Date: 202303132359
Unit Type and Rh: 6200
Unit Type and Rh: 6200

## 2021-12-25 ENCOUNTER — Emergency Department
Admission: EM | Admit: 2021-12-25 | Discharge: 2021-12-25 | Disposition: A | Discharge status: Home / Self Care | Payer: Commercial Managed Care - PPO | Attending: Emergency Medicine | Admitting: Emergency Medicine

## 2021-12-25 ENCOUNTER — Other Ambulatory Visit: Payer: Self-pay

## 2021-12-25 ENCOUNTER — Encounter: Payer: Self-pay | Admitting: Emergency Medicine

## 2021-12-25 ENCOUNTER — Emergency Department: Payer: Commercial Managed Care - PPO

## 2021-12-25 DIAGNOSIS — G8918 Other acute postprocedural pain: Secondary | ICD-10-CM | POA: Diagnosis not present

## 2021-12-25 DIAGNOSIS — Z20822 Contact with and (suspected) exposure to covid-19: Secondary | ICD-10-CM | POA: Diagnosis not present

## 2021-12-25 DIAGNOSIS — R1084 Generalized abdominal pain: Secondary | ICD-10-CM | POA: Diagnosis present

## 2021-12-25 LAB — COMPREHENSIVE METABOLIC PANEL
ALT: 61 U/L — ABNORMAL HIGH (ref 0–44)
AST: 62 U/L — ABNORMAL HIGH (ref 15–41)
Albumin: 4.1 g/dL (ref 3.5–5.0)
Alkaline Phosphatase: 79 U/L (ref 38–126)
Anion gap: 8 (ref 5–15)
BUN: 5 mg/dL — ABNORMAL LOW (ref 6–20)
CO2: 24 mmol/L (ref 22–32)
Calcium: 9.1 mg/dL (ref 8.9–10.3)
Chloride: 102 mmol/L (ref 98–111)
Creatinine, Ser: 0.58 mg/dL (ref 0.44–1.00)
GFR, Estimated: >60 mL/min (ref >60)
Glucose, Bld: 95 mg/dL (ref 70–99)
Potassium: 3.8 mmol/L (ref 3.5–5.1)
Sodium: 134 mmol/L — ABNORMAL LOW (ref 135–145)
Total Bilirubin: 0.6 mg/dL (ref 0.3–1.2)
Total Protein: 8.6 g/dL — ABNORMAL HIGH (ref 6.5–8.1)

## 2021-12-25 LAB — POC URINE PREG, ED: Preg Test, Ur: NEGATIVE

## 2021-12-25 LAB — URINALYSIS, ROUTINE W REFLEX MICROSCOPIC
Bilirubin Urine: NEGATIVE
Glucose, UA: NEGATIVE mg/dL
Hgb urine dipstick: NEGATIVE
Ketones, ur: NEGATIVE mg/dL
Leukocytes,Ua: NEGATIVE
Nitrite: NEGATIVE
Protein, ur: NEGATIVE mg/dL
Specific Gravity, Urine: 1.004 — ABNORMAL LOW (ref 1.005–1.030)
pH: 9 — ABNORMAL HIGH (ref 5.0–8.0)

## 2021-12-25 LAB — URINALYSIS, COMPLETE (UACMP) WITH MICROSCOPIC
Bacteria, UA: NONE SEEN
Bilirubin Urine: NEGATIVE
Glucose, UA: NEGATIVE mg/dL
Hgb urine dipstick: NEGATIVE
Ketones, ur: NEGATIVE mg/dL
Leukocytes,Ua: NEGATIVE
Nitrite: NEGATIVE
Protein, ur: NEGATIVE mg/dL
Specific Gravity, Urine: 1.005 (ref 1.005–1.030)
pH: 9 — ABNORMAL HIGH (ref 5.0–8.0)

## 2021-12-25 LAB — APTT: aPTT: 27 seconds (ref 24–36)

## 2021-12-25 LAB — CBC
HCT: 31.2 % — ABNORMAL LOW (ref 36.0–46.0)
Hemoglobin: 9.1 g/dL — ABNORMAL LOW (ref 12.0–15.0)
MCH: 21.8 pg — ABNORMAL LOW (ref 26.0–34.0)
MCHC: 29.2 g/dL — ABNORMAL LOW (ref 30.0–36.0)
MCV: 74.6 fL — ABNORMAL LOW (ref 80.0–100.0)
Platelets: 275 10*3/uL (ref 150–400)
RBC: 4.18 MIL/uL (ref 3.87–5.11)
RDW: 16.8 % — ABNORMAL HIGH (ref 11.5–15.5)
WBC: 8.3 10*3/uL (ref 4.0–10.5)
nRBC: 0 % (ref 0.0–0.2)

## 2021-12-25 LAB — RESP PANEL BY RT-PCR (FLU A&B, COVID) ARPGX2
Influenza A by PCR: NEGATIVE
Influenza B by PCR: NEGATIVE
SARS Coronavirus 2 by RT PCR: NEGATIVE

## 2021-12-25 LAB — LACTIC ACID, PLASMA: Lactic Acid, Venous: 1.5 mmol/L (ref 0.5–1.9)

## 2021-12-25 LAB — PROTIME-INR
INR: 1 (ref 0.8–1.2)
Prothrombin Time: 13.1 seconds (ref 11.4–15.2)

## 2021-12-25 LAB — LIPASE, BLOOD: Lipase: 24 U/L (ref 11–51)

## 2021-12-25 MED ORDER — ACETAMINOPHEN 500 MG PO TABS
1000.0000 mg | ORAL_TABLET | Freq: Once | ORAL | Status: AC
  Administered 2021-12-25: 1000 mg via ORAL
  Filled 2021-12-25: qty 2

## 2021-12-25 MED ORDER — FENTANYL CITRATE PF 50 MCG/ML IJ SOSY
50.0000 ug | PREFILLED_SYRINGE | Freq: Once | INTRAMUSCULAR | Status: DC
  Filled 2021-12-25: qty 1

## 2021-12-25 MED ORDER — SODIUM CHLORIDE 0.9 % IV SOLN
1.0000 g | Freq: Once | INTRAVENOUS | Status: AC
  Administered 2021-12-25: 1 g via INTRAVENOUS
  Filled 2021-12-25: qty 10

## 2021-12-25 MED ORDER — LACTATED RINGERS IV BOLUS
30.0000 mL/kg | Freq: Once | INTRAVENOUS | Status: AC
  Administered 2021-12-25: 2721 mL via INTRAVENOUS

## 2021-12-25 MED ORDER — ONDANSETRON 4 MG PO TBDP: 4.0000 mg | ORAL_TABLET | Freq: Three times a day (TID) | ORAL | 0 refills | Status: AC | PRN

## 2021-12-25 MED ORDER — IOHEXOL 300 MG/ML  SOLN
100.0000 mL | Freq: Once | INTRAMUSCULAR | Status: AC | PRN
  Administered 2021-12-25: 100 mL via INTRAVENOUS
  Filled 2021-12-25: qty 100

## 2021-12-25 NOTE — ED Triage Notes (Signed)
Arrives via [facility].  Partial hysterectomy on Friday. Last night, c/o Right Lower back pain, urinary frequency.  Patient C/O nausea, decreased PO intake due to nausea.  20 g RFA, 4 mg zofran and NS 250  Oxycodone last taken at 0900

## 2021-12-25 NOTE — ED Notes (Signed)
Unable to obtains 2nd blood cx due to poor access. Will start abx to avoid delays in treatment.

## 2021-12-25 NOTE — ED Notes (Signed)
Pt provided discharge instructions and prescription information. Pt was given the opportunity to ask questions and questions were answered. Discharge signature not obtained in the setting of the COVID-19 pandemic in order to reduce high touch surfaces.  ° °

## 2021-12-25 NOTE — ED Triage Notes (Incomplete)
Pt comes into the ED via [facility] from home c/o right lower back pain, urinary frequency, and post-op from hysterectomy completed on Friday.  Pt

## 2021-12-25 NOTE — Discharge Instructions (Addendum)
-  Follow-up with your OB/GYN provider tomorrow as discussed. -Take Tylenol/ibuprofen as needed for pain. -Return to the emergency department anytime if you begin to experience any new or worsening symptoms.

## 2021-12-25 NOTE — ED Provider Notes (Signed)
She  [facility] Provider Note    Event Date/Time   First MD Initiated Contact with Patient 12/25/21 1308     (approximate)   History   Chief Complaint Post-op Problem   HPI Cindy Hays is a 41 y.o. female, history of anemia, NSVT, presents to the emergency department for evaluation of back pain/urinary frequency.  Patient states that she underwent a partial hysterectomy/bilateral salpingectomy on Friday.  Last night, she began to experience significant back/flank pain, particularly on her left side, as well as urinary frequency.  She additionally states that there is a sharp pain whenever she urinates.  Additionally endorses nausea, no vomiting.  Denies fever/chills, chest pain, shortness of breath, headache, vaginal bleeding, blood in stool, or numbness/tingling in upper or lower extremities.   History Limitations: No limitations.      Physical Exam  Triage Vital Signs: ED Triage Vitals  Enc Vitals Group     BP 12/25/21 1250 135/74     Pulse Rate 12/25/21 1250 95     Resp 12/25/21 1250 16     Temp 12/25/21 1250 100.2 F (37.9 C)     Temp Source 12/25/21 1250 Oral     SpO2 12/25/21 1250 96 %     Weight 12/25/21 1249 200 lb (90.7 kg)     Height 12/25/21 1249 5' (1.524 m)     Head Circumference --      Peak Flow --      Pain Score 12/25/21 1258 10     Pain Loc --      Pain Edu? --      Excl. in [place]? --     Most recent vital signs: Vitals:   12/25/21 1750 12/25/21 2005  BP: 135/81 (!) 146/96  Pulse: 90 88  Resp: 14 16  Temp: 100.1 F (37.8 C) 98.5 F (36.9 C)  SpO2: 100% 100%    General: Awake, uncomfortable. CV: Good peripheral perfusion.  Resp: Normal effort.  Lung sounds clear bilaterally in the apices and bases. Abd: Soft, generalized moderate tenderness across the abdomen.  No distention.  CVA tenderness appreciated bilaterally Neuro: At baseline. No gross neurological deficits. Other: None.  Physical Exam    ED Results /  Procedures / Treatments  Labs (all labs ordered are listed, but only abnormal results are displayed) Labs Reviewed  COMPREHENSIVE METABOLIC PANEL - Abnormal; Notable for the following components:      Result Value   Sodium 134 (*)    BUN 5 (*)    Total Protein 8.6 (*)    AST 62 (*)    ALT 61 (*)    All other components within normal limits  CBC - Abnormal; Notable for the following components:   Hemoglobin 9.1 (*)    HCT 31.2 (*)    MCV 74.6 (*)    MCH 21.8 (*)    MCHC 29.2 (*)    RDW 16.8 (*)    All other components within normal limits  URINALYSIS, ROUTINE W REFLEX MICROSCOPIC - Abnormal; Notable for the following components:   Color, Urine STRAW (*)    APPearance CLEAR (*)    Specific Gravity, Urine 1.004 (*)    pH 9.0 (*)    All other components within normal limits  RESP PANEL BY RT-PCR (FLU A&B, COVID) ARPGX2  CULTURE, BLOOD (ROUTINE X 2)  CULTURE, BLOOD (ROUTINE X 2)  URINE CULTURE  C DIFFICILE QUICK SCREEN W PCR REFLEX    GASTROINTESTINAL PANEL BY PCR, STOOL (REPLACES  STOOL CULTURE)  LIPASE, BLOOD  LACTIC ACID, PLASMA  PROTIME-INR  APTT  LACTIC ACID, PLASMA  URINALYSIS, COMPLETE (UACMP) WITH MICROSCOPIC  POC URINE PREG, ED  POC URINE PREG, ED     EKG Not applicable.   RADIOLOGY  ED Provider Interpretation: I personally reviewed and interpreted this image.  Small fluid density consistent with possible seroma.  Enhancement of right ureter with fat stranding suggestive of urinary tract infection  CT Abdomen Pelvis W Contrast  Result Date: 12/25/2021 CLINICAL DATA:  Abdominal pain, acute nonlocalized. EXAM: CT ABDOMEN AND PELVIS WITH CONTRAST TECHNIQUE: Multidetector CT imaging of the abdomen and pelvis was performed using the standard protocol following bolus administration of intravenous contrast. RADIATION DOSE REDUCTION: This exam was performed according to the departmental dose-optimization program which includes automated exposure control, adjustment of  the mA and/or kV according to patient size and/or use of iterative reconstruction technique. CONTRAST:  157m OMNIPAQUE IOHEXOL 300 MG/ML  SOLN COMPARISON:  None. FINDINGS: Lower chest: No acute abnormality. Hepatobiliary: No focal liver abnormality is seen. No gallstones, gallbladder wall thickening, or biliary dilatation. Pancreas: Unremarkable. No pancreatic ductal dilatation or surrounding inflammatory changes. Spleen: Normal in size without focal abnormality. Adrenals/Urinary Tract: Adrenal glands are unremarkable. Kidneys are normal, without renal calculi, focal lesion, or hydronephrosis. There is mild enhancement of the right ureter with adjacent fat stranding. No appreciable right ureteral calculus. The left ureter is normal in caliber. Distal left ureter is not visualized due to edema in the pelvis secondary to recent hysterectomy. Stomach/Bowel: Stomach is within normal limits. Appendix appears normal. No evidence of bowel wall thickening, distention, or inflammatory changes. Vascular/Lymphatic: No significant vascular findings are present. No enlarged abdominal or pelvic lymph nodes. Reproductive: Postsurgical changes for recent hysterectomy. There is a small fluid collection in the left adnexal region measuring approximately 3.3 x 1.7 x 3.9 cm with mild adjacent fat stranding, which may represent left adnexal cyst or postsurgical seroma. Other: Diastasis of the rectus abdominus muscles with bowel loop containing nonobstructing ventral hernia. There is trace amount of free fluid and fat stranding in the dependent pelvis, likely postsurgical changes. Musculoskeletal: No acute or significant osseous findings. IMPRESSION: 1. Status post hysterectomy with mild fat stranding in the surgical bed. Small fluid density structure in the left adnexal region measuring approximately 3.3 x 1.7 x 3.9 cm, which may represent a ovarian cyst or post operative seroma. 2. Enhancement of the right ureter with adjacent fat  stranding without evidence of appreciable calculus. The findings are concerning for urinary tract infection. Correlation with urinalysis would be helpful. 3. Bowel loops are normal in caliber. Normal appendix. No evidence of colitis or diverticulitis. 4. Diastasis of the rectus abdominus muscles with nonobstructing ventral hernia. Electronically Signed   By: IKeane PoliceD.O.   On: 12/25/2021 14:56    PROCEDURES:  Critical Care performed:   .Critical Care Performed by: STeodoro Spray PA Authorized by: STeodoro Spray PA      MEDICATIONS ORDERED IN ED: Medications  iohexol (OMNIPAQUE) 300 MG/ML solution 100 mL (100 mLs Intravenous Contrast Given 12/25/21 1415)  lactated ringers bolus 2,721 mL (0 mLs Intravenous Stopped 12/25/21 2001)  cefTRIAXone (ROCEPHIN) 1 g in sodium chloride 0.9 % 100 mL IVPB (0 g Intravenous Stopped 12/25/21 1613)  acetaminophen (TYLENOL) tablet 1,000 mg (1,000 mg Oral Given 12/25/21 1552)     IMPRESSION / MDM / ASSESSMENT AND PLAN / ED COURSE  I reviewed the triage vital signs and the nursing notes.  Cindy Hays is a 41 y.o. female, history of anemia, NSVT, presents to the emergency department for evaluation of back pain/urinary frequency.  Patient states that she underwent a partial hysterectomy/bilateral salpingectomy on Friday.  Last night, she began to experience significant back/flank pain, particularly on her left side, as well as urinary frequency.  She additionally states that there is a sharp pain whenever she urinates.  Additionally endorses nausea, no vomiting.    Differential diagnosis includes, but is not limited to, cystitis, pyelonephritis, postop complication, intra-abdominal hemorrhage,   ED Course Patient appears uncomfortable.  Febrile at 100.6 F.  We will go ahead and treat with acetaminophen.  Patient previously treated with ondansetron by EMS prior to arrival.  CBC shows no evidence of leukocytosis,  though does show anemia at 9.1 from 9.6 two weeks prior.   Lipase 24.  Unlikely pancreatitis  Abdominal CT shows findings suggestive of ovarian cyst versus postoperative seroma, as well as findings suggestive of urinary tract infection.  See above.  On reexamination, patient was both febrile and tachycardic, meeting SIRS criteria.  We will go ahead and treat for suspected urinary tract infection with ceftriaxone.    Urinalysis shows no evidence of infection, though does show straw color and elevated pH at 9.0  Spoke with on-call [facility] OB/GYN consult ,Dr. Ouida Sills.  He stated that Dr. Leafy Ro, the physician who performed the patient's hysterectomy will come visit the patient soon.  Visited the patient together with Dr. Leafy Ro.  Dr. Leafy Ro advised that this is unlikely a urinary tract infection given the patient's unremarkable urinalysis and lack of clear CT findings.  Additionally, after reviewing abdominal CT imaging, she states that this is unlikely to be a postop complication either.  Given the patient's endorsement of diarrhea onset today, suggested doing stool culture/C. Difficile.   Assessment/Plan Given the patient's history, physical exam, and work-up thus far, I do not suspect any serious or life-threatening pathology.  Although her initial symptoms and vitals suggested urinary tract or intra-abdominal infection, her imaging and laboratory work-up has been reassuring.  Tachycardia and low-grade fever likely related to postop pain/stress, which appeared to respond well to acetaminophen.  It is possible given her endorsement of diarrhea starting this morning, her symptoms may be related to a gastroenteritis.  Will provide patient with kit for stool sampling to be used at home.  OB/GYN has agreed to see this patient tomorrow morning for follow-up.  Provide the patient with a prescription for ondansetron to manage nausea as well.  We will plan to discharge this patient.  Patient  was provided with anticipatory guidance, return precautions, and educational material. Encouraged the patient to return to the emergency department at any time if they begin to experience any new or worsening symptoms.       FINAL CLINICAL IMPRESSION(S) / ED DIAGNOSES   Final diagnoses:  Post-op pain     Rx / DC Orders   ED Discharge Orders          Ordered    ondansetron (ZOFRAN-ODT) 4 MG disintegrating tablet  Every 8 hours PRN        12/25/21 2008             Note:  This document was prepared using Dragon voice recognition software and may include unintentional dictation errors.   Teodoro Spray, [place] 12/25/21 2214    Lucrezia Starch, MD 12/26/21 [phone]

## 2021-12-25 NOTE — ED Notes (Signed)
Pt reports she is unable to have a BM without urinating to provide a stool specimen.

## 2021-12-27 ENCOUNTER — Emergency Department: Payer: Commercial Managed Care - PPO

## 2021-12-27 ENCOUNTER — Encounter: Payer: Self-pay | Admitting: Emergency Medicine

## 2021-12-27 ENCOUNTER — Emergency Department
Admission: EM | Admit: 2021-12-27 | Discharge: 2021-12-27 | Disposition: A | Discharge status: Home / Self Care | Payer: Commercial Managed Care - PPO | Attending: Emergency Medicine | Admitting: Emergency Medicine

## 2021-12-27 ENCOUNTER — Other Ambulatory Visit: Payer: Self-pay

## 2021-12-27 DIAGNOSIS — R1011 Right upper quadrant pain: Secondary | ICD-10-CM | POA: Diagnosis present

## 2021-12-27 DIAGNOSIS — K8050 Calculus of bile duct without cholangitis or cholecystitis without obstruction: Secondary | ICD-10-CM | POA: Diagnosis not present

## 2021-12-27 DIAGNOSIS — R7989 Other specified abnormal findings of blood chemistry: Secondary | ICD-10-CM | POA: Diagnosis not present

## 2021-12-27 LAB — CBC WITH DIFFERENTIAL/PLATELET
Abs Immature Granulocytes: 0.04 10*3/uL (ref 0.00–0.07)
Basophils Absolute: 0 10*3/uL (ref 0.0–0.1)
Basophils Relative: 0 %
Eosinophils Absolute: 0.1 10*3/uL (ref 0.0–0.5)
Eosinophils Relative: 2 %
HCT: 26.7 % — ABNORMAL LOW (ref 36.0–46.0)
Hemoglobin: 7.8 g/dL — ABNORMAL LOW (ref 12.0–15.0)
Immature Granulocytes: 1 %
Lymphocytes Relative: 27 %
Lymphs Abs: 2 10*3/uL (ref 0.7–4.0)
MCH: 22.5 pg — ABNORMAL LOW (ref 26.0–34.0)
MCHC: 29.2 g/dL — ABNORMAL LOW (ref 30.0–36.0)
MCV: 76.9 fL — ABNORMAL LOW (ref 80.0–100.0)
Monocytes Absolute: 0.7 10*3/uL (ref 0.1–1.0)
Monocytes Relative: 10 %
Neutro Abs: 4.4 10*3/uL (ref 1.7–7.7)
Neutrophils Relative %: 60 %
Platelets: 287 10*3/uL (ref 150–400)
RBC: 3.47 MIL/uL — ABNORMAL LOW (ref 3.87–5.11)
RDW: 17.1 % — ABNORMAL HIGH (ref 11.5–15.5)
WBC: 7.2 10*3/uL (ref 4.0–10.5)
nRBC: 0 % (ref 0.0–0.2)

## 2021-12-27 LAB — COMPREHENSIVE METABOLIC PANEL
ALT: 54 U/L — ABNORMAL HIGH (ref 0–44)
AST: 56 U/L — ABNORMAL HIGH (ref 15–41)
Albumin: 3.4 g/dL — ABNORMAL LOW (ref 3.5–5.0)
Alkaline Phosphatase: 77 U/L (ref 38–126)
Anion gap: 4 — ABNORMAL LOW (ref 5–15)
BUN: 5 mg/dL — ABNORMAL LOW (ref 6–20)
CO2: 26 mmol/L (ref 22–32)
Calcium: 8.6 mg/dL — ABNORMAL LOW (ref 8.9–10.3)
Chloride: 104 mmol/L (ref 98–111)
Creatinine, Ser: 0.61 mg/dL (ref 0.44–1.00)
GFR, Estimated: >60 mL/min (ref >60)
Glucose, Bld: 105 mg/dL — ABNORMAL HIGH (ref 70–99)
Potassium: 3.8 mmol/L (ref 3.5–5.1)
Sodium: 134 mmol/L — ABNORMAL LOW (ref 135–145)
Total Bilirubin: 0.4 mg/dL (ref 0.3–1.2)
Total Protein: 7.2 g/dL (ref 6.5–8.1)

## 2021-12-27 LAB — LIPASE, BLOOD: Lipase: 29 U/L (ref 11–51)

## 2021-12-27 LAB — TROPONIN I (HIGH SENSITIVITY): Troponin I (High Sensitivity): 3 ng/L (ref <18)

## 2021-12-27 NOTE — ED Triage Notes (Addendum)
Pt presents tonight via POV with complaints of generalized headache and chest tightness that started tonight. Of note, she was recently seen here for a hysterectomy on 2/3 and was advised to take Tylenol & Ibuprofen for pain and headaches and has had no relief. Denies SOB, vision changes, or dizziness.

## 2021-12-27 NOTE — ED Provider Notes (Addendum)
[facility] Provider Note    Event Date/Time   First MD Initiated Contact with Patient 12/27/21 [phone]     (approximate)   History   Headache and Chest Pain   HPI  Cindy Hays is a 41 y.o. female who recently had a hysterectomy by Dr. Leafy Ro about a week ago and had a follow-up visit to the emergency department just a little over 24 hours ago for variety of symptoms.  She presents tonight for what she initially thought was chest pain but now she believes it is upper abdominal discomfort.  She admits that she has been very anxious after the surgery and in general about her health.  She reports eating some spicy chicken and rice and then lying down for bed.  Shortly thereafter she developed a burning and aching pain with some nausea.  She does not suffer from heartburn and is unaware if she has any gallbladder issues.  She feels better now, just still has some residual symptoms in her upper abdomen in the middle.  No pain at this time.  She said that she felt better after getting here and some of it is probably her nerves.  No shortness of breath, no fever.  No vomiting.  She has had some abdominal discomfort since the surgery but feels better overall.  No lightheadedness, no dizziness, no passing out.     Physical Exam   Triage Vital Signs: ED Triage Vitals  Enc Vitals Group     BP 12/27/21 0218 135/83     Pulse Rate 12/27/21 0218 87     Resp 12/27/21 0218 18     Temp 12/27/21 0218 98.7 F (37.1 C)     Temp src --      SpO2 12/27/21 0218 98 %     Weight 12/27/21 0215 89.8 kg (198 lb)     Height 12/27/21 0215 1.524 m (5')     Head Circumference --      Peak Flow --      Pain Score 12/27/21 0215 8     Pain Loc --      Pain Edu? --      Excl. in [place]? --     Most recent vital signs: Vitals:   12/27/21 0430 12/27/21 0445  BP:    Pulse: 70 68  Resp: 20 19  Temp:    SpO2: 100% 100%     General: Awake, no distress.  CV:  Good peripheral  perfusion.  Resp:  Normal effort.  Abd:  No distention.  Minimal diffuse tenderness palpation throughout the abdomen.  No epigastric tenderness no right upper quadrant tenderness with no rebound or guarding and negative Murphy sign. Other:  Patient is in good spirits, laughing and joking with me, no acute distress or discomfort is evident.   ED Results / Procedures / Treatments   Labs (all labs ordered are listed, but only abnormal results are displayed) Labs Reviewed  COMPREHENSIVE METABOLIC PANEL - Abnormal; Notable for the following components:      Result Value   Sodium 134 (*)    Glucose, Bld 105 (*)    BUN 5 (*)    Calcium 8.6 (*)    Albumin 3.4 (*)    AST 56 (*)    ALT 54 (*)    Anion gap 4 (*)    All other components within normal limits  CBC WITH DIFFERENTIAL/PLATELET - Abnormal; Notable for the following components:   RBC 3.47 (*)  Hemoglobin 7.8 (*)    HCT 26.7 (*)    MCV 76.9 (*)    MCH 22.5 (*)    MCHC 29.2 (*)    RDW 17.1 (*)    All other components within normal limits  LIPASE, BLOOD  TROPONIN I (HIGH SENSITIVITY)     EKG  ED ECG REPORT I, Hinda Kehr, the attending physician, personally viewed and interpreted this ECG.  Date: 12/27/2021 EKG Time: 2:22 AM Rate: 80 Rhythm: normal sinus rhythm QRS Axis: normal Intervals: normal ST/T Wave abnormalities: normal Narrative Interpretation: no evidence of acute ischemia    RADIOLOGY See hospital course for details - in summary, the patient's chest x-ray shows no acute abnormalities and the abdominal ultrasound shows a mobile gallstone, otherwise unremarkable.     PROCEDURES:  Critical Care performed: No  .1-3 Lead EKG Interpretation Performed by: Hinda Kehr, MD Authorized by: Hinda Kehr, MD     Interpretation: normal     ECG rate:  65   ECG rate assessment: normal     Rhythm: sinus rhythm     Ectopy: none     Conduction: normal     MEDICATIONS ORDERED IN ED: Medications - No  data to display   IMPRESSION / MDM / [place] / ED COURSE  I reviewed the triage vital signs and the nursing notes.                              Differential diagnosis includes, but is not limited to, acid reflux, biliary colic/cholecystitis, much less likely PE or ACS.  I reviewed the patient's EKG and it is reassuring with no abnormalities.  Her vital signs are stable and within normal limits with no tachycardia and no hypoxemia.  She admits and agrees that anxiety is likely playing a significant role.  She is low risk for ACS. PERC rule might not apply given her recent surgery, but her Wells score for PE is 0.  Labs ordered include high-sensitivity troponin, comprehensive metabolic panel, and CBC with differential.  I reviewed the results and her CBC with differential is notable for a drop in her hemoglobin to 7.8, but this is in the setting of her recent surgery as well as aggressive IV fluid hydration yesterday, and she has no symptoms of acute blood loss anemia, so it is unlikely to be contributory.  Her WBC count is normal.  High-sensitivity troponin is within normal limits.  Comprehensive metabolic panel is most notable for a very mild elevation of her AST and her ALT, 56 and 54 respectively.  Her abdomen has no significant tenderness to palpation.  Her symptoms sound most consistent with acid reflux although biliary colic could also be the cause.  Given her very mild LFT elevation, I talked with her about getting an ultrasound and she agrees with the plan.  I am also adding a lipase.  If her ultrasound is reassuring and she remains essentially asymptomatic, I believe that she could be discharged for outpatient follow-up.  She agrees with the plan.  The patient is on the cardiac monitor to evaluate for evidence of arrhythmia and/or significant heart rate changes.   Clinical Course as of 12/27/21 8182  Fri Dec 27, 2021  9937 Lipase: 29 Lipase is within normal limits [CF]   0536 US ABDOMEN LIMITED RUQ (LIVER/GB) I personally reviewed the patient's ultrasound and I believe that I see multiple gallstones but the gallbladder wall appears to be  in normal thickness and I do not appreciate any pericholecystic fluid.  Awaiting radiology report. [CF]  [id number] I discussed the results with the patient.  She feels much better and has no additional symptoms at this time.  I had my usual and customary gallbladder discussion with the patient.  She is comfortable with the plan for discharge and outpatient follow-up and I am giving her contact information for general surgery.  I gave my usual and customary follow-up recommendations and return precautions and she agrees with the plan.  She declined any Maalox at this time and will get some at the store before she goes home. [CF]  [phone] Of note, I considered admission for this patient given her somewhat atypical symptoms and very slightly elevated LFTs, but given the reassuring ultrasound with a mobile gallstone, no sign of cholecystitis, no dilatation of the common bile duct, and stable LFTs over the last 24+ hours, I do not think it is necessary and the patient should be fine to follow-up.  Of note she is also asymptomatic at this time. [CF]    Clinical Course User Index [CF] Hinda Kehr, MD     FINAL CLINICAL IMPRESSION(S) / ED DIAGNOSES   Final diagnoses:  Biliary colic  Elevated LFTs     Rx / DC Orders   ED Discharge Orders     None        Note:  This document was prepared using Dragon voice recognition software and may include unintentional dictation errors.   Hinda Kehr, MD 12/27/21 3734    Hinda Kehr, MD 12/27/21 [phone]

## 2021-12-27 NOTE — Discharge Instructions (Signed)
As we discussed, your evaluation was generally reassuring.  We think that your symptoms were probably caused by some combination of acid reflux and/or biliary colic, which is the pain you could experience when you have 1 or more gallstones.  Please read through the included information.  Continue to take your regular medications and try to drink plenty of fluids and eat a relatively bland diet.  We recommend you follow-up with your primary care doctor but you may also want to consider contacting the office of Dr. Dahlia Byes to schedule a follow-up appointment with surgery and discuss whether or not you should have your gallbladder removed.  Try taking either some over-the-counter Maalox when you have symptoms similar to what you had tonight, or consider starting an over-the-counter medication such as Prilosec OTC, which is a type of antacid medication (a proton pump inhibitor, or PPI).  You could also discuss these options with Dr. Tressia Miners.    Return to the emergency department if you develop new or worsening symptoms that concern you.

## 2021-12-27 NOTE — ED Notes (Signed)
Pt presents with epigastric/chest discomfort. Burning like sensation. Started around 2000 last night. HA as well. Hx of Migraine. Drinks soda drinks frequently, however has not had any for the past two days. A&Ox4. Skin p/w/d. RR even and nonlabored.

## 2021-12-28 LAB — URINE CULTURE: Culture: 5000 — AB

## 2021-12-30 LAB — CULTURE, BLOOD (ROUTINE X 2)
Culture: NO GROWTH
Special Requests: ADEQUATE

## 2022-01-04 ENCOUNTER — Ambulatory Visit: Admit: 2022-01-04 | Payer: Commercial Managed Care - PPO

## 2022-03-03 ENCOUNTER — Encounter: Payer: Self-pay | Admitting: Intensive Care

## 2022-03-03 ENCOUNTER — Emergency Department
Admission: EM | Admit: 2022-03-03 | Discharge: 2022-03-03 | Disposition: A | Discharge status: Home / Self Care | Payer: Commercial Managed Care - PPO | Attending: Emergency Medicine | Admitting: Emergency Medicine

## 2022-03-03 ENCOUNTER — Emergency Department: Payer: Commercial Managed Care - PPO

## 2022-03-03 ENCOUNTER — Other Ambulatory Visit: Payer: Self-pay

## 2022-03-03 DIAGNOSIS — R519 Headache, unspecified: Secondary | ICD-10-CM | POA: Insufficient documentation

## 2022-03-03 DIAGNOSIS — R202 Paresthesia of skin: Secondary | ICD-10-CM | POA: Diagnosis not present

## 2022-03-03 DIAGNOSIS — R110 Nausea: Secondary | ICD-10-CM | POA: Diagnosis not present

## 2022-03-03 DIAGNOSIS — R42 Dizziness and giddiness: Secondary | ICD-10-CM | POA: Insufficient documentation

## 2022-03-03 DIAGNOSIS — I10 Essential (primary) hypertension: Secondary | ICD-10-CM | POA: Insufficient documentation

## 2022-03-03 LAB — CBC
HCT: 31.3 % — ABNORMAL LOW (ref 36.0–46.0)
Hemoglobin: 8.9 g/dL — ABNORMAL LOW (ref 12.0–15.0)
MCH: 21.4 pg — ABNORMAL LOW (ref 26.0–34.0)
MCHC: 28.4 g/dL — ABNORMAL LOW (ref 30.0–36.0)
MCV: 75.2 fL — ABNORMAL LOW (ref 80.0–100.0)
Platelets: 342 10*3/uL (ref 150–400)
RBC: 4.16 MIL/uL (ref 3.87–5.11)
RDW: 18 % — ABNORMAL HIGH (ref 11.5–15.5)
WBC: 5.5 10*3/uL (ref 4.0–10.5)
nRBC: 0 % (ref 0.0–0.2)

## 2022-03-03 LAB — HEPATIC FUNCTION PANEL
ALT: 28 U/L (ref 0–44)
AST: 34 U/L (ref 15–41)
Albumin: 4 g/dL (ref 3.5–5.0)
Alkaline Phosphatase: 50 U/L (ref 38–126)
Bilirubin, Direct: <0.1 mg/dL (ref 0.0–0.2)
Total Bilirubin: 0.5 mg/dL (ref 0.3–1.2)
Total Protein: 7.9 g/dL (ref 6.5–8.1)

## 2022-03-03 LAB — URINALYSIS, ROUTINE W REFLEX MICROSCOPIC
Bilirubin Urine: NEGATIVE
Glucose, UA: NEGATIVE mg/dL
Hgb urine dipstick: NEGATIVE
Ketones, ur: NEGATIVE mg/dL
Leukocytes,Ua: NEGATIVE
Nitrite: NEGATIVE
Protein, ur: NEGATIVE mg/dL
Specific Gravity, Urine: 1.011 (ref 1.005–1.030)
pH: 8 (ref 5.0–8.0)

## 2022-03-03 LAB — POC URINE PREG, ED: Preg Test, Ur: NEGATIVE

## 2022-03-03 LAB — BASIC METABOLIC PANEL
Anion gap: 6 (ref 5–15)
BUN: 7 mg/dL (ref 6–20)
CO2: 25 mmol/L (ref 22–32)
Calcium: 9 mg/dL (ref 8.9–10.3)
Chloride: 107 mmol/L (ref 98–111)
Creatinine, Ser: 0.66 mg/dL (ref 0.44–1.00)
GFR, Estimated: >60 mL/min (ref >60)
Glucose, Bld: 94 mg/dL (ref 70–99)
Potassium: 3.7 mmol/L (ref 3.5–5.1)
Sodium: 138 mmol/L (ref 135–145)

## 2022-03-03 LAB — LIPASE, BLOOD: Lipase: 38 U/L (ref 11–51)

## 2022-03-03 LAB — T4, FREE: Free T4: 0.65 ng/dL (ref 0.61–1.12)

## 2022-03-03 LAB — TSH: TSH: 0.953 u[IU]/mL (ref 0.350–4.500)

## 2022-03-03 MED ORDER — SODIUM CHLORIDE 0.9 % IV BOLUS
1000.0000 mL | Freq: Once | INTRAVENOUS | Status: AC
  Administered 2022-03-03: 1000 mL via INTRAVENOUS

## 2022-03-03 MED ORDER — DOCUSATE SODIUM 100 MG PO CAPS: 100.0000 mg | ORAL_CAPSULE | Freq: Two times a day (BID) | ORAL | 0 refills | Status: AC
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
## 2022-03-03 NOTE — ED Triage Notes (Signed)
Patient reports while at work she became dizzy and nauseas.  ?

## 2022-03-03 NOTE — ED Notes (Signed)
Patient transported to CT 

## 2022-03-07 ENCOUNTER — Emergency Department
Admission: EM | Admit: 2022-03-07 | Discharge: 2022-03-07 | Disposition: A | Discharge status: Home / Self Care | Payer: Commercial Managed Care - PPO | Attending: Emergency Medicine | Admitting: Emergency Medicine

## 2022-03-07 DIAGNOSIS — I10 Essential (primary) hypertension: Secondary | ICD-10-CM | POA: Insufficient documentation

## 2022-03-07 DIAGNOSIS — R110 Nausea: Secondary | ICD-10-CM | POA: Diagnosis not present

## 2022-03-07 DIAGNOSIS — R197 Diarrhea, unspecified: Secondary | ICD-10-CM | POA: Insufficient documentation

## 2022-03-07 DIAGNOSIS — R1084 Generalized abdominal pain: Secondary | ICD-10-CM | POA: Diagnosis not present

## 2022-03-07 DIAGNOSIS — R42 Dizziness and giddiness: Secondary | ICD-10-CM | POA: Insufficient documentation

## 2022-03-07 DIAGNOSIS — E860 Dehydration: Secondary | ICD-10-CM | POA: Insufficient documentation

## 2022-03-07 DIAGNOSIS — R200 Anesthesia of skin: Secondary | ICD-10-CM | POA: Diagnosis not present

## 2022-03-07 LAB — COMPREHENSIVE METABOLIC PANEL
ALT: 27 U/L (ref 0–44)
AST: 30 U/L (ref 15–41)
Albumin: 4 g/dL (ref 3.5–5.0)
Alkaline Phosphatase: 46 U/L (ref 38–126)
Anion gap: 9 (ref 5–15)
BUN: 7 mg/dL (ref 6–20)
CO2: 24 mmol/L (ref 22–32)
Calcium: 9.1 mg/dL (ref 8.9–10.3)
Chloride: 106 mmol/L (ref 98–111)
Creatinine, Ser: 0.66 mg/dL (ref 0.44–1.00)
GFR, Estimated: >60 mL/min (ref >60)
Glucose, Bld: 101 mg/dL — ABNORMAL HIGH (ref 70–99)
Potassium: 3.5 mmol/L (ref 3.5–5.1)
Sodium: 139 mmol/L (ref 135–145)
Total Bilirubin: 0.5 mg/dL (ref 0.3–1.2)
Total Protein: 8.1 g/dL (ref 6.5–8.1)

## 2022-03-07 LAB — URINALYSIS, ROUTINE W REFLEX MICROSCOPIC
Bacteria, UA: NONE SEEN
Bilirubin Urine: NEGATIVE
Glucose, UA: NEGATIVE mg/dL
Hgb urine dipstick: NEGATIVE
Ketones, ur: NEGATIVE mg/dL
Leukocytes,Ua: NEGATIVE
Nitrite: NEGATIVE
Protein, ur: NEGATIVE mg/dL
Specific Gravity, Urine: 1.002 — ABNORMAL LOW (ref 1.005–1.030)
WBC, UA: NONE SEEN WBC/hpf (ref 0–5)
pH: 7 (ref 5.0–8.0)

## 2022-03-07 LAB — CBC
HCT: 31.9 % — ABNORMAL LOW (ref 36.0–46.0)
Hemoglobin: 9 g/dL — ABNORMAL LOW (ref 12.0–15.0)
MCH: 21.6 pg — ABNORMAL LOW (ref 26.0–34.0)
MCHC: 28.2 g/dL — ABNORMAL LOW (ref 30.0–36.0)
MCV: 76.5 fL — ABNORMAL LOW (ref 80.0–100.0)
Platelets: 340 10*3/uL (ref 150–400)
RBC: 4.17 MIL/uL (ref 3.87–5.11)
RDW: 17.7 % — ABNORMAL HIGH (ref 11.5–15.5)
WBC: 4.8 10*3/uL (ref 4.0–10.5)
nRBC: 0 % (ref 0.0–0.2)

## 2022-03-07 LAB — LIPASE, BLOOD: Lipase: 34 U/L (ref 11–51)

## 2022-03-07 NOTE — ED Triage Notes (Addendum)
Pt reports she was at work and started feeling nauseas and dizzy. Pt states when she had been having abdominal pain that leads to diarrhea. Pt also reports intermittent numbness to bilateral feet and left hand. Pt states she was seen Monday for same. Pt reports if she is laying down there are no symptoms and when she stands she started to have the symptoms. Pt also endorses SOB. Pt states she has gallstones. Pt states symptoms today started 1hr PTA.  ?

## 2022-03-07 NOTE — ED Provider Notes (Signed)
? ?[facility] ?Provider Note ? ? Event Date/Time  ? First MD Initiated Contact with Patient 03/07/22 1035   ?  (approximate) ?History  ?Abdominal Pain ? ?HPI ?Cindy Hays is a 41 y.o. female with a past medical history of hypertension and nonsustained V. tach who presents for multiple episodes of orthostatic lightheadedness with associated nausea.  Patient states that she has been having diarrhea over the last few days and on going to work today she states that when she stands from a seated position she has significant feelings of lightheadedness, hand and foot numbness, nausea, and generalized abdominal pain.  Patient states that this abdominal pain does subside after episodes of diarrhea.  Patient is concerned that she is not staying as hydrated as she should given the loose stools.  Patient denies any recent travel or sick contacts.  Patient currently denies any vision changes, tinnitus, difficulty speaking, facial droop, sore throat, chest pain, shortness of breath, vomiting, dysuria, or weakness/paresthesias in any extremity ?Physical Exam  ?Triage Vital Signs: ?ED Triage Vitals [03/07/22 0958]  ?Enc Vitals Group  ?   BP 131/85  ?   Pulse Rate 70  ?   Resp 18  ?   Temp 98.3 ?F (36.8 ?C)  ?   Temp Source Oral  ?   SpO2 98 %  ?   Weight   ?   Height   ?   Head Circumference   ?   Peak Flow   ?   Pain Score   ?   Pain Loc   ?   Pain Edu?   ?   Excl. in [place]?   ? ?Most recent vital signs: ?Vitals:  ? 03/07/22 0958 03/07/22 1122  ?BP: 131/85 129/90  ?Pulse: 70 87  ?Resp: 18 18  ?Temp: 98.3 ?F (36.8 ?C)   ?SpO2: 98% 99%  ? ?General: Awake, oriented x4. ?CV:  Good peripheral perfusion.  ?Resp:  Normal effort.  ?Abd:  No distention.  ?Other:  Middle-aged obese African-American female laying in bed in no distress ?ED Results / Procedures / Treatments  ?Labs ?(all labs ordered are listed, but only abnormal results are displayed) ?Labs Reviewed  ?COMPREHENSIVE METABOLIC PANEL - Abnormal; Notable  for the following components:  ?    Result Value  ? Glucose, Bld 101 (*)   ? All other components within normal limits  ?CBC - Abnormal; Notable for the following components:  ? Hemoglobin 9.0 (*)   ? HCT 31.9 (*)   ? MCV 76.5 (*)   ? MCH 21.6 (*)   ? MCHC 28.2 (*)   ? RDW 17.7 (*)   ? All other components within normal limits  ?URINALYSIS, ROUTINE W REFLEX MICROSCOPIC - Abnormal; Notable for the following components:  ? Color, Urine COLORLESS (*)   ? APPearance CLEAR (*)   ? Specific Gravity, Urine 1.002 (*)   ? All other components within normal limits  ?LIPASE, BLOOD  ? ?PROCEDURES: ?Critical Care performed: No ?.1-3 Lead EKG Interpretation ?Performed by: Naaman Plummer, MD ?Authorized by: Naaman Plummer, MD  ? ?  Interpretation: normal   ?  ECG rate:  86 ?  ECG rate assessment: normal   ?  Rhythm: sinus rhythm   ?  Ectopy: none   ?  Conduction: normal   ?MEDICATIONS ORDERED IN ED: ?Medications - No data to display ?IMPRESSION / MDM / ASSESSMENT AND PLAN / ED COURSE  ?I reviewed the triage vital signs and the  nursing notes. ?             ?               ?The patient is on the cardiac monitor to evaluate for evidence of arrhythmia and/or significant heart rate changes. ?Patient?s symptoms not typical for emergent causes of abdominal pain such as, but not limited to, appendicitis, abdominal aortic aneurysm, surgical biliary disease, pancreatitis, SBO, mesenteric ischemia, serious intra-abdominal bacterial illness. ?Presentation also not typical of gynecologic emergencies such as TOA, Ovarian Torsion, PID. Not Ectopic. ?Doubt atypical ACS. ? ?Pt tolerating PO. ?Disposition: Patient will be discharged with strict return precautions and follow up with primary MD within 12-24 hours for further evaluation. ?Patient understands that this still may have an early presentation of an emergent medical condition such as appendicitis that will require a recheck. ? ?  ?FINAL CLINICAL IMPRESSION(S) / ED DIAGNOSES  ? ?Final  diagnoses:  ?Generalized abdominal pain  ?Diarrhea, unspecified type  ?Intermittent lightheadedness  ?Dehydration  ? ?Rx / DC Orders  ? ?ED Discharge Orders   ? ? None  ? ?  ? ?Note:  This document was prepared using Dragon voice recognition software and may include unintentional dictation errors. ?  ?Naaman Plummer, MD ?03/07/22 1227 ? ?

## 2022-03-19 ENCOUNTER — Ambulatory Visit: Payer: Commercial Managed Care - PPO | Admitting: Surgery

## 2022-04-04 ENCOUNTER — Encounter: Payer: Self-pay | Admitting: Oncology

## 2022-04-04 NOTE — Progress Notes (Signed)
Patient contacted for New pt visit. Chart updated and meds reviewed. Appt questions answered.

## 2022-04-07 ENCOUNTER — Inpatient Hospital Stay: Payer: Commercial Managed Care - PPO

## 2022-04-07 ENCOUNTER — Inpatient Hospital Stay: Payer: Commercial Managed Care - PPO | Attending: Oncology | Admitting: Oncology

## 2022-04-08 NOTE — Progress Notes (Deleted)
Hematology/Oncology Consult note [phone] [phone]         Patient Care Team: Gladstone Lighter, MD as PCP - General (Internal Medicine) Benjaman Kindler, MD as Consulting Physician (Obstetrics and Gynecology) Earlie Server, MD as Consulting Physician (Hematology)  REFERRING PROVIDER: Mortimer Fries, PA-C  CHIEF COMPLAINTS/REASON FOR VISIT:  Evaluation of   HISTORY OF PRESENTING ILLNESS:   Cindy Hays is a  41 y.o.  female with PMH listed below was seen in consultation at the request of  Mortimer Fries, PA-C  for evaluation of    Review of Systems - Oncology  MEDICAL HISTORY:  Past Medical History:  Diagnosis Date   Anemia    B12 deficiency    History of gestational hypertension    Hypertension    Preterm labor    Uterine leiomyoma     SURGICAL HISTORY: Past Surgical History:  Procedure Laterality Date   NO PAST SURGERIES     ROBOTIC ASSISTED LAPAROSCOPIC HYSTERECTOMY AND SALPINGECTOMY Bilateral 12/20/2021   Procedure: XI ROBOTIC ASSISTED LAPAROSCOPIC HYSTERECTOMY AND SALPINGECTOMY;  Surgeon: Benjaman Kindler, MD;  Location: [facility] ORS;  Service: Gynecology;  Laterality: Bilateral;    SOCIAL HISTORY: Social History   Socioeconomic History   Marital status: Single    Spouse name: Not on file   Number of children: Not on file   Years of education: Not on file   Highest education level: Not on file  Occupational History   Not on file  Tobacco Use   Smoking status: Never   Smokeless tobacco: Never  Vaping Use   Vaping Use: Never used  Substance and Sexual Activity   Alcohol use: Not Currently    Alcohol/week: 1.0 standard drink    Types: 1 Glasses of wine per week    Comment: occ wine   Drug use: No   Sexual activity: Not Currently  Other Topics Concern   Not on file  Social History Narrative   Not on file   Social Determinants of Health   Financial Resource Strain: Not on file  Food Insecurity: Not on file  Transportation  Needs: Not on file  Physical Activity: Not on file  Stress: Not on file  Social Connections: Not on file  Intimate Partner Violence: Not on file    FAMILY HISTORY: Family History  Problem Relation Age of Onset   Hypertension Mother    Alcohol abuse Mother    Liver disease Mother    Other Father        unknown medical history   Hypertension Maternal Grandmother    Diabetes Maternal Grandmother    Dementia Maternal Grandmother    Autoimmune disease Daughter     ALLERGIES:  is allergic to hydrocodone-acetaminophen and vicodin [hydrocodone-acetaminophen].  MEDICATIONS:  Current Outpatient Medications  Medication Sig Dispense Refill   buPROPion HCl (WELLBUTRIN SR PO) Take 25 mg by mouth daily.     topiramate (TOPAMAX) 50 MG tablet Take by mouth.     oxyCODONE (OXY IR/ROXICODONE) 5 MG immediate release tablet Take 1 tablet (5 mg total) by mouth every 4 (four) hours as needed for severe pain. (Patient not taking: Reported on 04/04/2022) 20 tablet 0   No current facility-administered medications for this visit.     PHYSICAL EXAMINATION: ECOG PERFORMANCE STATUS: {CHL ONC ECOG PS:[id number]} There were no vitals filed for this visit. There were no vitals filed for this visit.  Physical Exam  LABORATORY DATA:  I have reviewed the data as listed Lab Results  Component Value Date  WBC 4.8 03/07/2022   HGB 9.0 (L) 03/07/2022   HCT 31.9 (L) 03/07/2022   MCV 76.5 (L) 03/07/2022   PLT 340 03/07/2022   Recent Labs    12/27/21 0229 03/03/22 1729 03/07/22 1006  NA 134* 138 139  K 3.8 3.7 3.5  CL 104 107 106  CO2 '26 25 24  '$ GLUCOSE 105* 94 101*  BUN 5* 7 7  CREATININE 0.61 0.66 0.66  CALCIUM 8.6* 9.0 9.1  GFRNONAA >60 >60 >60  PROT 7.2 7.9 8.1  ALBUMIN 3.4* 4.0 4.0  AST 56* 34 30  ALT 54* 28 27  ALKPHOS 77 50 46  BILITOT 0.4 0.5 0.5  BILIDIR  --  <0.1  --   IBILI  --  NOT CALCULATED  --    Iron/TIBC/Ferritin/ %Sat    Component Value Date/Time   IRON 182 (H)  08/03/2012 0912   TIBC 599 (H) 08/03/2012 0912   FERRITIN 10 08/03/2012 0912   IRONPCTSAT 30 08/03/2012 0912      RADIOGRAPHIC STUDIES: I have personally reviewed the radiological images as listed and agreed with the findings in the report. No results found.    ASSESSMENT & PLAN:  No diagnosis found.   No orders of the defined types were placed in this encounter.   All questions were answered. The patient knows to call the clinic with any problems questions or concerns.   Mortimer Fries, PA-C    Return of visit:  Thank you for this kind referral and the opportunity to participate in the care of this patient. A copy of today's note is routed to referring provider   Earlie Server, MD, PhD [facility] Hematology Oncology 04/08/2022

## 2022-05-30 ENCOUNTER — Ambulatory Visit
Admission: RE | Admit: 2022-05-30 | Discharge: 2022-05-30 | Disposition: A | Discharge status: Home / Self Care | Payer: Commercial Managed Care - PPO | Source: Ambulatory Visit | Attending: Emergency Medicine | Admitting: Emergency Medicine

## 2022-05-30 VITALS — BP 126/83 | HR 79 | Temp 98.7°F | Resp 18 | Ht 61.0 in | Wt 190.0 lb

## 2022-05-30 DIAGNOSIS — N898 Other specified noninflammatory disorders of vagina: Secondary | ICD-10-CM

## 2022-05-30 LAB — URINALYSIS, MICROSCOPIC (REFLEX)

## 2022-05-30 LAB — URINALYSIS, ROUTINE W REFLEX MICROSCOPIC
Bilirubin Urine: NEGATIVE
Glucose, UA: NEGATIVE mg/dL
Hgb urine dipstick: NEGATIVE
Leukocytes,Ua: NEGATIVE
Nitrite: NEGATIVE
Protein, ur: 100 mg/dL — AB
Specific Gravity, Urine: 1.025 (ref 1.005–1.030)
pH: 5.5 (ref 5.0–8.0)

## 2022-05-30 LAB — WET PREP, GENITAL
Clue Cells Wet Prep HPF POC: NONE SEEN
Sperm: NONE SEEN
Trich, Wet Prep: NONE SEEN
WBC, Wet Prep HPF POC: <10 — AB (ref <10)
Yeast Wet Prep HPF POC: NONE SEEN

## 2022-05-30 MED ORDER — FLUCONAZOLE 200 MG PO TABS
200.0000 mg | ORAL_TABLET | Freq: Every day | ORAL | 1 refills | Status: AC
Start: 2022-05-30 — End: 2022-06-01

## 2022-05-30 NOTE — Discharge Instructions (Addendum)
The vaginal swab today did not show the presence of a vaginal yeast infection but your symptoms are consistent with that of a vaginal yeast infection.  This coupled with your recent antibiotic use makes it the most likely cause of your vaginal discharge and itching.  Take 1 Diflucan tablet now and repeat in 3 days if you are still symptomatic.  Your gonorrhea and Chlamydia testing will be back tomorrow and if either of those tests are positive we will call you to arrange treatment.  If your test are negative you will get no results in your MyChart.  Return for reevaluation for any new or worsening symptoms.

## 2022-05-30 NOTE — ED Triage Notes (Signed)
Patient c/o vaginal discharge, itching, clumpy, whiteish in color x 3 days.  Patient has taken the Monistat OTC w/o relief.  Just finished antibiotics for dental infection.

## 2022-05-30 NOTE — ED Provider Notes (Signed)
[facility]    CSN: [id number] Arrival date & time: 05/30/22  1446      History   Chief Complaint Chief Complaint  Patient presents with   Appointment    HPI Cindy Hays is a 41 y.o. female.   HPI  41 year old female here for evaluation of vaginal complaints.  Patient reports that for last 3 days she has been experiencing clumpy white vaginal discharge with vaginal itching.  This is not associate with fever, low back pain, painful urination, or urinary urgency or frequency.  She denies any concern for sexually transmitted infections.  She did recently finished course of antibiotics for a dental infection.  Past Medical History:  Diagnosis Date   Anemia    B12 deficiency    History of gestational hypertension    Hypertension    Preterm labor    Uterine leiomyoma     Patient Active Problem List   Diagnosis Date Noted   NSVT (nonsustained ventricular tachycardia) (HCC)    Chronic headaches 02/06/2020   Recurrent syncope 02/06/2020   Occipital neuralgia 02/06/2020   Sinus tachycardia 02/06/2020   ANA positive 02/06/2020   Syncope 02/06/2020   Iron deficiency anemia 10/28/2019   Pregnancy 08/04/2018   Indication for care in labor or delivery 08/03/2018   Labor and delivery indication for care or intervention 08/02/2018   Chronic hypertension with superimposed pre-eclampsia 07/30/2018   Chronic hypertension with superimposed preeclampsia 07/21/2018   Short interval between pregnancies affecting pregnancy in first trimester, antepartum 03/18/2018   Advanced maternal age in multigravida, first trimester 03/18/2018   Fetal demise, greater than 22 weeks, antepartum, single gestation 11/13/2017    Past Surgical History:  Procedure Laterality Date   NO PAST SURGERIES     ROBOTIC ASSISTED LAPAROSCOPIC HYSTERECTOMY AND SALPINGECTOMY Bilateral 12/20/2021   Procedure: XI ROBOTIC ASSISTED LAPAROSCOPIC HYSTERECTOMY AND SALPINGECTOMY;  Surgeon: Benjaman Kindler,  MD;  Location: [facility] ORS;  Service: Gynecology;  Laterality: Bilateral;    OB History     Gravida  6   Para  6   Term  4   Preterm  1   AB      Living  5      SAB      IAB      Ectopic      Multiple  0   Live Births  1            Home Medications    Prior to Admission medications   Medication Sig Start Date End Date Taking? Authorizing Provider  fluconazole (DIFLUCAN) 200 MG tablet Take 1 tablet (200 mg total) by mouth daily for 2 doses. 05/30/22 06/01/22 Yes Margarette Canada, NP  topiramate (TOPAMAX) 50 MG tablet Take by mouth. 03/30/22 09/26/22 Yes [provider]  loratadine (CLARITIN) 10 MG tablet Take 10 mg by mouth daily. 02/03/20 05/13/20  [provider]  buPROPion HCl (WELLBUTRIN SR PO) Take 25 mg by mouth daily.    [provider]  oxyCODONE (OXY IR/ROXICODONE) 5 MG immediate release tablet Take 1 tablet (5 mg total) by mouth every 4 (four) hours as needed for severe pain. Patient not taking: Reported on 04/04/2022 12/20/21   Benjaman Kindler, MD  [name] 150-35 MCG/24HR transdermal patch 1 patch once a week. 09/04/19 11/18/19  [provider]    Family History Family History  Problem Relation Age of Onset   Hypertension Mother    Alcohol abuse Mother    Liver disease Mother    Other Father  unknown medical history   Hypertension Maternal Grandmother    Diabetes Maternal Grandmother    Dementia Maternal Grandmother    Autoimmune disease Daughter     Social History Social History   Tobacco Use   Smoking status: Never   Smokeless tobacco: Never  Vaping Use   Vaping Use: Never used  Substance Use Topics   Alcohol use: Not Currently    Alcohol/week: 1.0 standard drink of alcohol    Types: 1 Glasses of wine per week    Comment: occ wine   Drug use: No     Allergies   Hydrocodone-acetaminophen and Vicodin [hydrocodone-acetaminophen]   Review of Systems Review of Systems  Constitutional:  Negative for  fever.  Gastrointestinal:  Negative for abdominal pain, nausea and vomiting.  Genitourinary:  Positive for vaginal discharge and vaginal pain. Negative for dysuria, frequency, hematuria and urgency.  Musculoskeletal:  Negative for back pain.  Hematological: Negative.   Psychiatric/Behavioral: Negative.       Physical Exam Triage Vital Signs ED Triage Vitals  Enc Vitals Group     BP 05/30/22 1459 126/83     Pulse Rate 05/30/22 1459 79     Resp 05/30/22 1459 18     Temp 05/30/22 1459 98.7 F (37.1 C)     Temp Source 05/30/22 1459 Oral     SpO2 05/30/22 1459 100 %     Weight 05/30/22 1500 190 lb (86.2 kg)     Height 05/30/22 1500 '5\' 1"'$  (1.549 m)     Head Circumference --      Peak Flow --      Pain Score 05/30/22 1500 0     Pain Loc --      Pain Edu? --      Excl. in [place]? --    No data found.  Updated Vital Signs BP 126/83 (BP Location: Left Arm)   Pulse 79   Temp 98.7 F (37.1 C) (Oral)   Resp 18   Ht '5\' 1"'$  (1.549 m)   Wt 190 lb (86.2 kg)   LMP 10/27/2021 (Exact Date) Comment: having depot shots  SpO2 100%   BMI 35.90 kg/m   Visual Acuity Right Eye Distance:   Left Eye Distance:   Bilateral Distance:    Right Eye Near:   Left Eye Near:    Bilateral Near:     Physical Exam Vitals and nursing note reviewed.  Constitutional:      Appearance: Normal appearance. She is not ill-appearing.  HENT:     Head: Normocephalic and atraumatic.  Cardiovascular:     Rate and Rhythm: Normal rate and regular rhythm.     Pulses: Normal pulses.     Heart sounds: Normal heart sounds. No murmur heard.    No friction rub. No gallop.  Pulmonary:     Effort: Pulmonary effort is normal.     Breath sounds: Normal breath sounds. No wheezing, rhonchi or rales.  Abdominal:     General: Abdomen is flat.     Tenderness: There is no abdominal tenderness. There is no right CVA tenderness, left CVA tenderness, guarding or rebound.  Skin:    General: Skin is warm and dry.      Capillary Refill: Capillary refill takes less than 2 seconds.     Findings: No erythema or rash.  Neurological:     General: No focal deficit present.     Mental Status: She is alert and oriented to person, place, and time.  Psychiatric:  Mood and Affect: Mood normal.        Behavior: Behavior normal.        Thought Content: Thought content normal.        Judgment: Judgment normal.      UC Treatments / Results  Labs (all labs ordered are listed, but only abnormal results are displayed) Labs Reviewed  WET PREP, GENITAL - Abnormal; Notable for the following components:      Result Value   WBC, Wet Prep HPF POC <10 (*)    All other components within normal limits  URINALYSIS, ROUTINE W REFLEX MICROSCOPIC - Abnormal; Notable for the following components:   APPearance HAZY (*)    Ketones, ur TRACE (*)    Protein, ur 100 (*)    All other components within normal limits  URINALYSIS, MICROSCOPIC (REFLEX) - Abnormal; Notable for the following components:   Bacteria, UA FEW (*)    All other components within normal limits  CERVICOVAGINAL ANCILLARY ONLY    EKG   Radiology No results found.  Procedures Procedures (including critical care time)  Medications Ordered in UC Medications - No data to display  Initial Impression / Assessment and Plan / UC Course  I have reviewed the triage vital signs and the nursing notes.  Pertinent labs & imaging results that were available during my care of the patient were reviewed by me and considered in my medical decision making (see chart for details).  Patient is a nontoxic-appearing 41 year old female here for evaluation of vaginal discharge and itching that has been going on for last 3 days.  This is not associate with any urinary symptoms and she denies abdominal pain.  Physical exam reveals benign cardiopulmonary exam with S1-S2 heart sounds with regular rate and rhythm lung sounds are clear auscultation all fields.  No CVA  tenderness on exam.  Abdomen soft and nontender to palpation.  Urinalysis, wet prep, and gonorrhea chlamydia test were collected at triage and are pending.  Wet prep is negative for yeast, trichomonas, or clue cells.  Urinalysis shows a hazy appearance, trace ketones, 100 protein.  Negative for leukocyte esterase or nitrites.  Reflex micro shows 21-50 squamous epithelials.  Gonorrhea and Chlamydia tests are still pending.  Patient has used over-the-counter Monistat for relief of her symptoms.  Despite the negative wet prep her symptoms are consistent with vaginal yeast infection.  She also was recently on antibiotics for treatment of a dental infection.  I will discharge her home on Diflucan 200 mg now and repeat in 3 days if she still having symptoms.   Final Clinical Impressions(s) / UC Diagnoses   Final diagnoses:  Vaginal discharge     Discharge Instructions      The vaginal swab today did not show the presence of a vaginal yeast infection but your symptoms are consistent with that of a vaginal yeast infection.  This coupled with your recent antibiotic use makes it the most likely cause of your vaginal discharge and itching.  Take 1 Diflucan tablet now and repeat in 3 days if you are still symptomatic.  Your gonorrhea and Chlamydia testing will be back tomorrow and if either of those tests are positive we will call you to arrange treatment.  If your test are negative you will get no results in your MyChart.  Return for reevaluation for any new or worsening symptoms.     ED Prescriptions     Medication Sig Dispense Auth. Provider   fluconazole (DIFLUCAN) 200 MG tablet Take  1 tablet (200 mg total) by mouth daily for 2 doses. 2 tablet Margarette Canada, NP      PDMP not reviewed this encounter.   Margarette Canada, NP 05/30/22 1550

## 2022-06-02 LAB — CERVICOVAGINAL ANCILLARY ONLY
Chlamydia: NEGATIVE
Comment: NEGATIVE
Comment: NORMAL
Neisseria Gonorrhea: NEGATIVE

## 2022-06-12 ENCOUNTER — Encounter: Payer: Self-pay | Admitting: Emergency Medicine

## 2022-06-12 ENCOUNTER — Emergency Department
Admission: EM | Admit: 2022-06-12 | Discharge: 2022-06-12 | Disposition: A | Discharge status: Home / Self Care | Payer: Commercial Managed Care - PPO | Attending: Emergency Medicine | Admitting: Emergency Medicine

## 2022-06-12 ENCOUNTER — Other Ambulatory Visit: Payer: Self-pay

## 2022-06-12 DIAGNOSIS — E860 Dehydration: Secondary | ICD-10-CM | POA: Diagnosis not present

## 2022-06-12 DIAGNOSIS — R531 Weakness: Secondary | ICD-10-CM | POA: Diagnosis not present

## 2022-06-12 DIAGNOSIS — Z20822 Contact with and (suspected) exposure to covid-19: Secondary | ICD-10-CM | POA: Insufficient documentation

## 2022-06-12 DIAGNOSIS — R112 Nausea with vomiting, unspecified: Secondary | ICD-10-CM

## 2022-06-12 LAB — URINALYSIS, ROUTINE W REFLEX MICROSCOPIC
Bilirubin Urine: NEGATIVE
Glucose, UA: NEGATIVE mg/dL
Hgb urine dipstick: NEGATIVE
Ketones, ur: NEGATIVE mg/dL
Leukocytes,Ua: NEGATIVE
Nitrite: NEGATIVE
Protein, ur: 30 mg/dL — AB
Specific Gravity, Urine: 1.003 — ABNORMAL LOW (ref 1.005–1.030)
pH: 8 (ref 5.0–8.0)

## 2022-06-12 LAB — BASIC METABOLIC PANEL
Anion gap: 6 (ref 5–15)
BUN: 7 mg/dL (ref 6–20)
CO2: 21 mmol/L — ABNORMAL LOW (ref 22–32)
Calcium: 9.2 mg/dL (ref 8.9–10.3)
Chloride: 108 mmol/L (ref 98–111)
Creatinine, Ser: 0.82 mg/dL (ref 0.44–1.00)
GFR, Estimated: >60 mL/min (ref >60)
Glucose, Bld: 107 mg/dL — ABNORMAL HIGH (ref 70–99)
Potassium: 3.6 mmol/L (ref 3.5–5.1)
Sodium: 135 mmol/L (ref 135–145)

## 2022-06-12 LAB — CBC
HCT: 33.4 % — ABNORMAL LOW (ref 36.0–46.0)
Hemoglobin: 9.8 g/dL — ABNORMAL LOW (ref 12.0–15.0)
MCH: 22.5 pg — ABNORMAL LOW (ref 26.0–34.0)
MCHC: 29.3 g/dL — ABNORMAL LOW (ref 30.0–36.0)
MCV: 76.6 fL — ABNORMAL LOW (ref 80.0–100.0)
Platelets: 343 10*3/uL (ref 150–400)
RBC: 4.36 MIL/uL (ref 3.87–5.11)
RDW: 17.3 % — ABNORMAL HIGH (ref 11.5–15.5)
WBC: 4.6 10*3/uL (ref 4.0–10.5)
nRBC: 0 % (ref 0.0–0.2)

## 2022-06-12 LAB — SARS CORONAVIRUS 2 BY RT PCR: SARS Coronavirus 2 by RT PCR: NEGATIVE

## 2022-06-12 MED ORDER — ONDANSETRON 8 MG PO TBDP: 8.0000 mg | ORAL_TABLET | Freq: Three times a day (TID) | ORAL | 0 refills | Status: DC | PRN

## 2022-06-12 MED ORDER — ONDANSETRON 8 MG PO TBDP
8.0000 mg | ORAL_TABLET | Freq: Once | ORAL | Status: AC
Start: 2022-06-12 — End: 2022-06-12
  Administered 2022-06-12: 8 mg via ORAL
  Filled 2022-06-12: qty 1

## 2022-06-12 NOTE — ED Triage Notes (Signed)
Pt reports generalized weakness x 2 days. Pt reports emesis and nausea. Denies pain.

## 2022-06-12 NOTE — ED Provider Notes (Signed)
[facility] Provider Note   Event Date/Time   First MD Initiated Contact with Patient 06/12/22 [phone]     (approximate) History  Weakness  HPI Cindy Hays is a 41 y.o. female who presents complaining of generalized weakness over the past 2 days as well as nausea/vomiting over the last 24 hours after starting a "detox cleanse".  Patient states that she is concerned as she normally takes this cleanse on a full stomach however she took her migraine medication and did not have anything on her stomach.  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  Patient states that she has been trying to take "liquid IV" but that it has been very harsh on her stomach and causes her to vomit. ROS: Patient currently denies any fevers, vision changes, tinnitus, difficulty speaking, facial droop, sore throat, chest pain, shortness of breath, abdominal pain, diarrhea, dysuria, or weakness/numbness/paresthesias in any extremity   Physical Exam  Triage Vital Signs: ED Triage Vitals  Enc Vitals Group     BP 06/12/22 0837 (!) 126/91     Pulse Rate 06/12/22 0837 97     Resp 06/12/22 0837 17     Temp 06/12/22 0837 98.7 F (37.1 C)     Temp Source 06/12/22 0837 Oral     SpO2 06/12/22 0837 100 %     Weight 06/12/22 0857 189 lb 13.1 oz (86.1 kg)     Height 06/12/22 0857 '5\' 1"'$  (1.549 m)     Head Circumference --      Peak Flow --      Pain Score 06/12/22 0838 0     Pain Loc --      Pain Edu? --      Excl. in [place]? --    Most recent vital signs: Vitals:   06/12/22 0837  BP: (!) 126/91  Pulse: 97  Resp: 17  Temp: 98.7 F (37.1 C)  SpO2: 100%   General: Awake, oriented x4. CV:  Good peripheral perfusion.  Resp:  Normal effort.  Abd:  No distention.  Other:  Obese middle-aged African-American female laying in bed in no acute distress. ED Results / Procedures / Treatments  Labs (all labs ordered are listed, but only abnormal  results are displayed) Labs Reviewed  BASIC METABOLIC PANEL - Abnormal; Notable for the following components:      Result Value   CO2 21 (*)    Glucose, Bld 107 (*)    All other components within normal limits  CBC - Abnormal; Notable for the following components:   Hemoglobin 9.8 (*)    HCT 33.4 (*)    MCV 76.6 (*)    MCH 22.5 (*)    MCHC 29.3 (*)    RDW 17.3 (*)    All other components within normal limits  URINALYSIS, ROUTINE W REFLEX MICROSCOPIC - Abnormal; Notable for the following components:   Color, Urine STRAW (*)    APPearance CLEAR (*)    Specific Gravity, Urine 1.003 (*)    Protein, ur 30 (*)    Bacteria, UA RARE (*)    All other components within normal limits  SARS CORONAVIRUS 2 BY RT PCR  PROCEDURES: Critical Care performed: No Procedures MEDICATIONS ORDERED IN ED: Medications  ondansetron (ZOFRAN-ODT) disintegrating tablet 8 mg (8 mg Oral Given 06/12/22 7867)   IMPRESSION / MDM / ASSESSMENT AND PLAN / ED COURSE  I reviewed the triage vital signs and the nursing  notes.                             The patient is on the cardiac monitor to evaluate for evidence of arrhythmia and/or significant heart rate changes. Patient's presentation is most consistent with acute presentation with potential threat to life or bodily function. Patient presents for acute nausea/vomiting The cause of the patients symptoms is not clear, but the patient is overall well appearing and is suspected to have a transient course of illness.  Given History and Exam there does not appear to be an emergent cause of the symptoms such as small bowel obstruction, coronary syndrome, bowel ischemia, DKA, pancreatitis, appendicitis, other acute abdomen or other emergent problem. CBC does show evidence of stable anemia with a hemoglobin of 9.03 months ago that is now 9.8.  Per note from the [facility] system on 03/05/2022, patient is currently being worked up for chronic  anemia Reassessment: After treatment, the patient is feeling much better, tolerating PO fluids, and shows no signs of dehydration.   Disposition: Discharge home with prompt primary care physician follow up in the next 48 hours. Strict return precautions discussed.   FINAL CLINICAL IMPRESSION(S) / ED DIAGNOSES   Final diagnoses:  Nausea and vomiting, unspecified vomiting type  Generalized weakness  Dehydration   Rx / DC Orders   ED Discharge Orders          Ordered    ondansetron (ZOFRAN-ODT) 8 MG disintegrating tablet  Every 8 hours PRN        06/12/22 0959           Note:  This document was prepared using Dragon voice recognition software and may include unintentional dictation errors.   Naaman Plummer, MD 06/12/22 1004

## 2022-09-23 ENCOUNTER — Other Ambulatory Visit: Payer: Self-pay | Admitting: Student

## 2022-09-23 DIAGNOSIS — H02401 Unspecified ptosis of right eyelid: Secondary | ICD-10-CM

## 2022-09-25 ENCOUNTER — Emergency Department
Admission: EM | Admit: 2022-09-25 | Discharge: 2022-09-25 | Disposition: A | Discharge status: Home / Self Care | Payer: 59 | Attending: Emergency Medicine | Admitting: Emergency Medicine

## 2022-09-25 ENCOUNTER — Other Ambulatory Visit: Payer: Self-pay

## 2022-09-25 DIAGNOSIS — R202 Paresthesia of skin: Secondary | ICD-10-CM | POA: Diagnosis present

## 2022-09-25 DIAGNOSIS — M5412 Radiculopathy, cervical region: Secondary | ICD-10-CM | POA: Insufficient documentation

## 2022-09-25 DIAGNOSIS — I10 Essential (primary) hypertension: Secondary | ICD-10-CM | POA: Insufficient documentation

## 2022-09-25 LAB — BASIC METABOLIC PANEL
Anion gap: 7 (ref 5–15)
BUN: 6 mg/dL (ref 6–20)
CO2: 24 mmol/L (ref 22–32)
Calcium: 8.9 mg/dL (ref 8.9–10.3)
Chloride: 108 mmol/L (ref 98–111)
Creatinine, Ser: 0.59 mg/dL (ref 0.44–1.00)
GFR, Estimated: >60 mL/min (ref >60)
Glucose, Bld: 99 mg/dL (ref 70–99)
Potassium: 3.5 mmol/L (ref 3.5–5.1)
Sodium: 139 mmol/L (ref 135–145)

## 2022-09-25 LAB — LIPASE, BLOOD: Lipase: 38 U/L (ref 11–51)

## 2022-09-25 LAB — CBC
HCT: 30.7 % — ABNORMAL LOW (ref 36.0–46.0)
Hemoglobin: 9.4 g/dL — ABNORMAL LOW (ref 12.0–15.0)
MCH: 23.9 pg — ABNORMAL LOW (ref 26.0–34.0)
MCHC: 30.6 g/dL (ref 30.0–36.0)
MCV: 77.9 fL — ABNORMAL LOW (ref 80.0–100.0)
Platelets: 330 10*3/uL (ref 150–400)
RBC: 3.94 MIL/uL (ref 3.87–5.11)
RDW: 17.2 % — ABNORMAL HIGH (ref 11.5–15.5)
WBC: 5.5 10*3/uL (ref 4.0–10.5)
nRBC: 0 % (ref 0.0–0.2)

## 2022-09-25 NOTE — ED Provider Notes (Signed)
[facility] Provider Note   Event Date/Time   First MD Initiated Contact with Patient 09/25/22 2151     (approximate) History  Weakness  HPI Cindy Hays is a 41 y.o. female with a stated past medical history of recurrent migraines and hypertension who presents for acute onset of right upper extremity paresthesias and heat that lasted approximately 1 hour and resolved spontaneously.  Patient states that she has never had symptoms like this before and she is concerned as she did not have a headache at this time either that she states she does have abnormal neurologic symptoms with migraines.  Patient denies any complaints at this time.  Patient denies any similar pains in the past.  Patient does state that she has been having mild neck pain over the last couple days. ROS: Patient currently denies any vision changes, tinnitus, difficulty speaking, facial droop, sore throat, chest pain, shortness of breath, abdominal pain, nausea/vomiting/diarrhea, dysuria, or weakness/numbness/paresthesias in any extremity   Physical Exam  Triage Vital Signs: ED Triage Vitals  Enc Vitals Group     BP 09/25/22 1729 (!) 147/99     Pulse Rate 09/25/22 1729 64     Resp 09/25/22 1729 18     Temp 09/25/22 1729 99 F (37.2 C)     Temp Source 09/25/22 1729 Oral     SpO2 09/25/22 1729 99 %     Weight 09/25/22 1731 187 lb (84.8 kg)     Height 09/25/22 1731 '5\' 1"'$  (1.549 m)     Head Circumference --      Peak Flow --      Pain Score 09/25/22 1730 8     Pain Loc --      Pain Edu? --      Excl. in [place]? --    Most recent vital signs: Vitals:   09/25/22 1729 09/25/22 2226  BP: (!) 147/99   Pulse: 64 66  Resp: 18 18  Temp: 99 F (37.2 C) 98.9 F (37.2 C)  SpO2: 99% 99%   General: Awake, oriented x4. CV:  Good peripheral perfusion.  Resp:  Normal effort.  Abd:  No distention.  Other:  Middle-aged obese African-American female laying in bed in no acute distress.  NIHSS 0 ED  Results / Procedures / Treatments  Labs (all labs ordered are listed, but only abnormal results are displayed) Labs Reviewed  CBC - Abnormal; Notable for the following components:      Result Value   Hemoglobin 9.4 (*)    HCT 30.7 (*)    MCV 77.9 (*)    MCH 23.9 (*)    RDW 17.2 (*)    All other components within normal limits  BASIC METABOLIC PANEL  LIPASE, BLOOD  URINALYSIS, ROUTINE W REFLEX MICROSCOPIC  CBG MONITORING, ED  POC URINE PREG, ED   EKG ED ECG REPORT I, Naaman Plummer, the attending physician, personally viewed and interpreted this ECG. Date: 09/25/2022 EKG Time: 1728 Rate: 100 Rhythm: Tachycardic sinus rhythm QRS Axis: normal Intervals: normal ST/T Wave abnormalities: normal Narrative Interpretation: Tachycardic sinus rhythm.  No evidence of acute ischemia PROCEDURES: Critical Care performed: No .1-3 Lead EKG Interpretation  Performed by: Naaman Plummer, MD Authorized by: Naaman Plummer, MD     Interpretation: normal     ECG rate:  66   ECG rate assessment: normal     Rhythm: sinus rhythm     Ectopy: none     Conduction: normal  MEDICATIONS ORDERED IN ED: Medications - No data to display IMPRESSION / MDM / [place] / ED COURSE  I reviewed the triage vital signs and the nursing notes.                             The patient is on the cardiac monitor to evaluate for evidence of arrhythmia and/or significant heart rate changes. Patient's presentation is most consistent with acute presentation with potential threat to life or bodily function. + neck pain + Right upper extremity paresthesias  ED Workup: Defer C-Spine imaging given negative by NEXUS criteria  Given History, Exam the patient appears to have a cervical radiculopathy.   Patient appears to be low risk for complications or other emergent conditions such as  anginal equivalent, frank cervical instability, arterial dissection, osteomyelitis, epidural abscess, central cord  syndrome, c-spine fracture, other spinal emergencies -Description of pain does fit with cervical radiculopathy.  This may be secondary to nerve impingement or arthritis in the neck.  Patient was given follow-up with spinal surgery as needed Rx: NSAIDs, outpatient physical therapy evaluation and recommendation for home exercises in the interim Disposition: Discharge. The patient has been given strict return precautions and understands the need to follow up within 48 hours with their primary care provider   FINAL CLINICAL IMPRESSION(S) / ED DIAGNOSES   Final diagnoses:  Cervical radiculopathy  Paresthesia of right arm   Rx / DC Orders   ED Discharge Orders     None      Note:  This document was prepared using Dragon voice recognition software and may include unintentional dictation errors.   Naaman Plummer, MD 09/26/22 0005

## 2022-09-25 NOTE — ED Provider Triage Note (Signed)
Emergency Medicine Provider Triage Evaluation Note  Cindy Hays, a 41 y.o. female  was evaluated in triage.  Pt complains of neurolyse weakness as well as some low back pain.  Patient denies any fevers, chills, sweats, chest pain, shortness breath she also denies any dysuria recent injury or trauma.  She presents to the ED after seeing her primary provider who prescribed some muscle relaxants but denies any significant benefit to her symptoms..  Review of Systems  Positive: Weakness, LBP Negative: FCS  Physical Exam  BP (!) 147/99 (BP Location: Right Arm)   Pulse 64   Temp 99 F (37.2 C) (Oral)   Resp 18   Ht '5\' 1"'$  (1.549 m)   Wt 84.8 kg   LMP 10/27/2021 (Exact Date) Comment: having depot shots  SpO2 99%   BMI 35.33 kg/m  Gen:   Awake, no distress  NAD Resp:  Normal effort CTA MSK:   Moves extremities without difficulty  Other:    Medical Decision Making  Medically screening exam initiated at 5:35 PM.  Appropriate orders placed.  Elton Catalano was informed that the remainder of the evaluation will be completed by another provider, this initial triage assessment does not replace that evaluation, and the importance of remaining in the ED until their evaluation is complete.  Patient to the ED for evaluation of generalized weakness as well as some low back pain.  Patient denies any fevers, chills, sweats, or recent injury or trauma.   Melvenia Needles, PA-C 09/25/22 1736

## 2022-09-25 NOTE — ED Triage Notes (Signed)
Pt comes in with complaints of dizziness/hot flash sensation on the right side that started today about an hour ago. Pt states that right now it feels like her right side is on fire. Pt also complains of lower back pain that has been going on since Friday.  Pt states that she feels like she is going to pass out.

## 2022-09-26 ENCOUNTER — Encounter: Payer: Self-pay | Admitting: Family Medicine

## 2022-09-26 ENCOUNTER — Other Ambulatory Visit: Payer: Self-pay | Admitting: Internal Medicine

## 2022-09-26 ENCOUNTER — Ambulatory Visit
Admission: RE | Admit: 2022-09-26 | Discharge: 2022-09-26 | Disposition: A | Discharge status: Home / Self Care | Payer: 59 | Source: Ambulatory Visit | Attending: Internal Medicine | Admitting: Internal Medicine

## 2022-09-26 DIAGNOSIS — R1011 Right upper quadrant pain: Secondary | ICD-10-CM

## 2022-09-26 MED ORDER — IOHEXOL 300 MG/ML  SOLN
100.0000 mL | Freq: Once | INTRAMUSCULAR | Status: AC | PRN
  Administered 2022-09-26: 100 mL via INTRAVENOUS

## 2022-10-04 ENCOUNTER — Ambulatory Visit
Admission: RE | Admit: 2022-10-04 | Discharge: 2022-10-04 | Disposition: A | Discharge status: Home / Self Care | Payer: 59 | Source: Ambulatory Visit | Attending: Student | Admitting: Student

## 2022-10-07 ENCOUNTER — Ambulatory Visit: Payer: Self-pay | Admitting: Surgery

## 2022-10-07 NOTE — H&P (View-Only) (Signed)
Subjective:   CC: Calculus of gallbladder with biliary obstruction but without cholecystitis [K80.21]  HPI:  Cindy Hays is a 41 y.o. female who was referred by Gladstone Lighter, MD for evaluation of above CC. Symptoms were first noted several weeks ago. Pain is sharp and intermittent, RUQ, radiating to RLQ.  Associated with right flank and back pain as well, exacerbated by nothing specific.  Currently still tender in general area     Past Medical History:  has a past medical history of ANA positive, Anemia, Depression, Fibroid, History of abnormal cervical Pap smear, History of blood transfusion (2019), preeclampsia, prior pregnancy, currently pregnant, Hypertension (11/2017), Migraines, Obesity, and Supervision of high risk pregnancy in second trimester (07/23/2017).  Past Surgical History:  has a past surgical history that includes Hysterectomy (12/20/2021).  Family History: family history includes Clotting disorder in her child; Diabetes in her maternal grandmother; Diabetes type II in her maternal grandmother and mother; Heart disease in her maternal grandmother; High blood pressure (Hypertension) in her maternal grandmother and mother; Kidney disease in her mother; Migraines in her mother; Myocardial Infarction (Heart attack) in her mother.  Social History:  reports that she has never smoked. She has never used smokeless tobacco. She reports current alcohol use of about 2.0 standard drinks of alcohol per week. She reports that she does not use drugs.  Current Medications: has a current medication list which includes the following prescription(s): butalbital-acetaminophen-caff and ferrous sulfate.  Allergies:  Allergies as of 10/07/2022 - Reviewed 10/07/2022  Allergen Reaction Noted   Hydrocodone-acetaminophen Rash 09/17/2015    ROS:  A 15 point review of systems was performed and pertinent positives and negatives noted in HPI    Objective:     BP 133/83   Pulse 101   Ht  154.9 cm ('5\' 1"'$ )   Wt 86.2 kg (190 lb)   LMP 08/15/2021 (Approximate)   BMI 35.90 kg/m    Constitutional :  No distress, cooperative, alert  Lymphatics/Throat:  Supple with no lymphadenopathy  Respiratory:  Clear to auscultation bilaterally  Cardiovascular:  Regular rate and rhythm  Gastrointestinal: Soft, non-tender, non-distended, no organomegaly.  Musculoskeletal: Steady gait and movement  Skin: Cool and moist, lap hysterectomy surgical scars  Psychiatric: Normal affect, non-agitated, not confused       LABS:  N/a   RADS: CLINICAL DATA:  Right flank pain and right upper quadrant pain for 1  month.   EXAM:  CT ABDOMEN AND PELVIS WITH CONTRAST   TECHNIQUE:  Multidetector CT imaging of the abdomen and pelvis was performed  using the standard protocol following bolus administration of  intravenous contrast.   RADIATION DOSE REDUCTION: This exam was performed according to the  departmental dose-optimization program which includes automated  exposure control, adjustment of the mA and/or kV according to  patient size and/or use of iterative reconstruction technique.   CONTRAST:  154m OMNIPAQUE IOHEXOL 300 MG/ML  SOLN   COMPARISON:  CT abdomen/pelvis 12/25/2021   FINDINGS:  Lower chest: The lung bases are clear. The imaged heart is  unremarkable.   Hepatobiliary: Liver is unremarkable. A 1.8 cm gallstone is again  seen without evidence of acute cholecystitis. There is no biliary  ductal dilatation.   Pancreas: Unremarkable.   Spleen: Unremarkable.   Adrenals/Urinary Tract: The adrenals are unremarkable.   The kidneys are unremarkable, with no focal lesion, stone,  hydronephrosis, or hydroureter. The bladder is unremarkable.   Stomach/Bowel: The stomach is unremarkable. There is no evidence of  bowel  obstruction. There is no abnormal bowel wall thickening or  inflammatory change. The appendix is normal.   Vascular/Lymphatic: Abdominal aorta is normal in course  and caliber.  The major branch vessels are patent. The main portal and splenic  veins are patent. There is no abdominal or pelvic lymphadenopathy.   Reproductive: Uterus is surgically absent. There is no adnexal mass.   Other: There is no ascites or free air.   Musculoskeletal: There is no acute osseous abnormality or suspicious  osseous lesion.   IMPRESSION:  1. Cholelithiasis without evidence of acute cholecystitis.  2. Otherwise, no acute finding in the abdomen or pelvis.    Electronically Signed    By: Valetta Mole M.D.    On: 09/26/2022 15:44  Assessment:      Calculus of gallbladder with biliary obstruction but without cholecystitis [K80.21]  Pain pattern atypical but patient will like to proceed with robo lap chole.    Plan:     1. Calculus of gallbladder with biliary obstruction but without cholecystitis [K80.21] Discussed the risk of surgery including post-op infxn, seroma, biloma, chronic pain, poor-delayed wound healing, retained gallstone, conversion to open procedure, post-op SBO or ileus, and need for additional procedures to address said risks.  The risks of general anesthetic including MI, CVA, sudden death or even reaction to anesthetic medications also discussed. Alternatives include continued observation.  Benefits include possible symptom relief, prevention of complications including acute cholecystitis, pancreatitis.  Typical post operative recovery of 3-5 days rest, continued pain in area and incision sites, possible loose stools up to 4-6 weeks, also discussed.  ED return precautions given for sudden increase in RUQ pain, with possible accompanying fever, nausea, and/or vomiting.  The patient understands the risks, any and all questions were answered to the patient's satisfaction.  2. Patient has elected to proceed with surgical treatment. Procedure will be scheduled.  Written consent was obtained..robotic assisted laparoscopic. No guarantee of pain  improvement  labs/images/medications/previous chart entries reviewed personally and relevant changes/updates noted above.

## 2022-10-07 NOTE — H&P (Signed)
Subjective:   CC: Calculus of gallbladder with biliary obstruction but without cholecystitis [K80.21]  HPI:  Cindy Hays is a 41 y.o. female who was referred by Gladstone Lighter, MD for evaluation of above CC. Symptoms were first noted several weeks ago. Pain is sharp and intermittent, RUQ, radiating to RLQ.  Associated with right flank and back pain as well, exacerbated by nothing specific.  Currently still tender in general area     Past Medical History:  has a past medical history of ANA positive, Anemia, Depression, Fibroid, History of abnormal cervical Pap smear, History of blood transfusion (2019), preeclampsia, prior pregnancy, currently pregnant, Hypertension (11/2017), Migraines, Obesity, and Supervision of high risk pregnancy in second trimester (07/23/2017).  Past Surgical History:  has a past surgical history that includes Hysterectomy (12/20/2021).  Family History: family history includes Clotting disorder in her child; Diabetes in her maternal grandmother; Diabetes type II in her maternal grandmother and mother; Heart disease in her maternal grandmother; High blood pressure (Hypertension) in her maternal grandmother and mother; Kidney disease in her mother; Migraines in her mother; Myocardial Infarction (Heart attack) in her mother.  Social History:  reports that she has never smoked. She has never used smokeless tobacco. She reports current alcohol use of about 2.0 standard drinks of alcohol per week. She reports that she does not use drugs.  Current Medications: has a current medication list which includes the following prescription(s): butalbital-acetaminophen-caff and ferrous sulfate.  Allergies:  Allergies as of 10/07/2022 - Reviewed 10/07/2022  Allergen Reaction Noted   Hydrocodone-acetaminophen Rash 09/17/2015    ROS:  A 15 point review of systems was performed and pertinent positives and negatives noted in HPI    Objective:     BP 133/83   Pulse 101   Ht  154.9 cm ('5\' 1"'$ )   Wt 86.2 kg (190 lb)   LMP 08/15/2021 (Approximate)   BMI 35.90 kg/m    Constitutional :  No distress, cooperative, alert  Lymphatics/Throat:  Supple with no lymphadenopathy  Respiratory:  Clear to auscultation bilaterally  Cardiovascular:  Regular rate and rhythm  Gastrointestinal: Soft, non-tender, non-distended, no organomegaly.  Musculoskeletal: Steady gait and movement  Skin: Cool and moist, lap hysterectomy surgical scars  Psychiatric: Normal affect, non-agitated, not confused       LABS:  N/a   RADS: CLINICAL DATA:  Right flank pain and right upper quadrant pain for 1  month.   EXAM:  CT ABDOMEN AND PELVIS WITH CONTRAST   TECHNIQUE:  Multidetector CT imaging of the abdomen and pelvis was performed  using the standard protocol following bolus administration of  intravenous contrast.   RADIATION DOSE REDUCTION: This exam was performed according to the  departmental dose-optimization program which includes automated  exposure control, adjustment of the mA and/or kV according to  patient size and/or use of iterative reconstruction technique.   CONTRAST:  193m OMNIPAQUE IOHEXOL 300 MG/ML  SOLN   COMPARISON:  CT abdomen/pelvis 12/25/2021   FINDINGS:  Lower chest: The lung bases are clear. The imaged heart is  unremarkable.   Hepatobiliary: Liver is unremarkable. A 1.8 cm gallstone is again  seen without evidence of acute cholecystitis. There is no biliary  ductal dilatation.   Pancreas: Unremarkable.   Spleen: Unremarkable.   Adrenals/Urinary Tract: The adrenals are unremarkable.   The kidneys are unremarkable, with no focal lesion, stone,  hydronephrosis, or hydroureter. The bladder is unremarkable.   Stomach/Bowel: The stomach is unremarkable. There is no evidence of  bowel  obstruction. There is no abnormal bowel wall thickening or  inflammatory change. The appendix is normal.   Vascular/Lymphatic: Abdominal aorta is normal in course  and caliber.  The major branch vessels are patent. The main portal and splenic  veins are patent. There is no abdominal or pelvic lymphadenopathy.   Reproductive: Uterus is surgically absent. There is no adnexal mass.   Other: There is no ascites or free air.   Musculoskeletal: There is no acute osseous abnormality or suspicious  osseous lesion.   IMPRESSION:  1. Cholelithiasis without evidence of acute cholecystitis.  2. Otherwise, no acute finding in the abdomen or pelvis.    Electronically Signed    By: Valetta Mole M.D.    On: 09/26/2022 15:44  Assessment:      Calculus of gallbladder with biliary obstruction but without cholecystitis [K80.21]  Pain pattern atypical but patient will like to proceed with robo lap chole.    Plan:     1. Calculus of gallbladder with biliary obstruction but without cholecystitis [K80.21] Discussed the risk of surgery including post-op infxn, seroma, biloma, chronic pain, poor-delayed wound healing, retained gallstone, conversion to open procedure, post-op SBO or ileus, and need for additional procedures to address said risks.  The risks of general anesthetic including MI, CVA, sudden death or even reaction to anesthetic medications also discussed. Alternatives include continued observation.  Benefits include possible symptom relief, prevention of complications including acute cholecystitis, pancreatitis.  Typical post operative recovery of 3-5 days rest, continued pain in area and incision sites, possible loose stools up to 4-6 weeks, also discussed.  ED return precautions given for sudden increase in RUQ pain, with possible accompanying fever, nausea, and/or vomiting.  The patient understands the risks, any and all questions were answered to the patient's satisfaction.  2. Patient has elected to proceed with surgical treatment. Procedure will be scheduled.  Written consent was obtained..robotic assisted laparoscopic. No guarantee of pain  improvement  labs/images/medications/previous chart entries reviewed personally and relevant changes/updates noted above.

## 2022-10-17 DIAGNOSIS — K8020 Calculus of gallbladder without cholecystitis without obstruction: Secondary | ICD-10-CM

## 2022-10-17 HISTORY — DX: Calculus of gallbladder without cholecystitis without obstruction: K80.20

## 2022-10-29 ENCOUNTER — Encounter
Admission: RE | Admit: 2022-10-29 | Discharge: 2022-10-29 | Disposition: A | Discharge status: Home / Self Care | Payer: 59 | Source: Ambulatory Visit | Attending: Surgery | Admitting: Surgery

## 2022-10-29 NOTE — Patient Instructions (Addendum)
Your procedure is scheduled on: Thursday, December 21 Report to the Registration Desk on the 1st floor of the [facility]. To find out your arrival time, please call [phone] between 1PM - 3PM on: Wednesday, December 20 If your arrival time is 6:00 am, do not arrive prior to that time as the [facility] entrance doors do not open until 6:00 am.  REMEMBER: Instructions that are not followed completely may result in serious medical risk, up to and including death; or upon the discretion of your surgeon and anesthesiologist your surgery may need to be rescheduled.  Do not eat food after midnight the night before surgery.  No gum chewing, lozengers or hard candies.  You may however, drink CLEAR liquids up to 2 hours before you are scheduled to arrive for your surgery. Do not drink anything within 2 hours of your scheduled arrival time.  Clear liquids include: - water  - apple juice without pulp - gatorade (not RED colors) - black coffee or tea (Do NOT add milk or creamers to the coffee or tea) Do NOT drink anything that is not on this list.  DO NOT TAKE ANY MEDICATIONS THE MORNING OF SURGERY  One week prior to surgery: starting December 14 Stop Anti-inflammatories (NSAIDS) such as Advil, Aleve, Ibuprofen, Motrin, Naproxen, Naprosyn and Aspirin based products such as Excedrin, Goodys Powder, BC Powder. Stop ANY OVER THE COUNTER supplements until after surgery. You may however, continue to take Tylenol if needed for pain up until the day of surgery.  No Alcohol for 24 hours before or after surgery.  No Smoking including e-cigarettes for 24 hours prior to surgery.  No chewable tobacco products for at least 6 hours prior to surgery.  No nicotine patches on the day of surgery.  Do not use any "recreational" drugs for at least a week prior to your surgery.  Please be advised that the combination of cocaine and anesthesia may have negative outcomes, up to and including death. If you  test positive for cocaine, your surgery will be cancelled.  On the morning of surgery brush your teeth with toothpaste and water, you may rinse your mouth with mouthwash if you wish. Do not swallow any toothpaste or mouthwash.  Use CHG Soap as directed on instruction sheet.  Do not wear jewelry, make-up, hairpins, clips or nail polish.  Do not wear lotions, powders, or perfumes.   Do not shave body from the neck down 48 hours prior to surgery just in case you cut yourself which could leave a site for infection.  Also, freshly shaved skin may become irritated if using the CHG soap.  Contact lenses, hearing aids and dentures may not be worn into surgery.  Do not bring valuables to the hospital. [facility] is not responsible for any missing/lost belongings or valuables.   Notify your doctor if there is any change in your medical condition (cold, fever, infection).  Wear comfortable clothing (specific to your surgery type) to the hospital.  After surgery, you can help prevent lung complications by doing breathing exercises.  Take deep breaths and cough every 1-2 hours. Your doctor may order a device called an Incentive Spirometer to help you take deep breaths. When coughing or sneezing, hold a pillow firmly against your incision with both hands. This is called "splinting." Doing this helps protect your incision. It also decreases belly discomfort.  If you are being discharged the day of surgery, you will not be allowed to drive home. You will need  a responsible adult (18 years or older) to drive you home and stay with you that night.   If you are taking public transportation, you will need to have a responsible adult (18 years or older) with you. Please confirm with your physician that it is acceptable to use public transportation.   Please call the [facility] at [phone] if you have any questions about these instructions.  Surgery Visitation Policy:  Patients  undergoing a surgery or procedure may have two family members or support persons with them as long as the person is not COVID-19 positive or experiencing its symptoms.      Preparing for Surgery with CHLORHEXIDINE GLUCONATE (CHG) Soap  Chlorhexidine Gluconate (CHG) Soap  o An antiseptic cleaner that kills germs and bonds with the skin to continue killing germs even after washing  o Used for showering the night before surgery and morning of surgery  Before surgery, you can play an important role by reducing the number of germs on your skin.  CHG (Chlorhexidine gluconate) soap is an antiseptic cleanser which kills germs and bonds with the skin to continue killing germs even after washing.  Please do not use if you have an allergy to CHG or antibacterial soaps. If your skin becomes reddened/irritated stop using the CHG.  1. Shower the NIGHT BEFORE SURGERY and the MORNING OF SURGERY with CHG soap.  2. If you choose to wash your hair, wash your hair first as usual with your normal shampoo.  3. After shampooing, rinse your hair and body thoroughly to remove the shampoo.  4. Use CHG as you would any other liquid soap. You can apply CHG directly to the skin and wash gently with a scrungie or a clean washcloth.  5. Apply the CHG soap to your body only from the neck down. Do not use on open wounds or open sores. Avoid contact with your eyes, ears, mouth, and genitals (private parts). Wash face and genitals (private parts) with your normal soap.  6. Wash thoroughly, paying special attention to the area where your surgery will be performed.  7. Thoroughly rinse your body with warm water.  8. Do not shower/wash with your normal soap after using and rinsing off the CHG soap.  9. Pat yourself dry with a clean towel.  10. Wear clean pajamas to bed the night before surgery.  12. Place clean sheets on your bed the night of your first shower and do not sleep with pets.  13. Shower again with the  CHG soap on the day of surgery prior to arriving at the hospital.  14. Do not apply any deodorants/lotions/powders.  15. Please wear clean clothes to the hospital.

## 2022-11-06 ENCOUNTER — Ambulatory Visit: Payer: 59 | Admitting: Registered Nurse

## 2022-11-06 ENCOUNTER — Other Ambulatory Visit: Payer: Self-pay

## 2022-11-06 ENCOUNTER — Ambulatory Visit
Admission: RE | Admit: 2022-11-06 | Discharge: 2022-11-06 | Disposition: A | Discharge status: Home / Self Care | Payer: 59 | Attending: Surgery | Admitting: Surgery

## 2022-11-06 ENCOUNTER — Encounter
Admission: RE | Disposition: A | Discharge status: Home / Self Care | Payer: Self-pay | Source: Home / Self Care | Attending: Surgery

## 2022-11-06 ENCOUNTER — Encounter: Payer: Self-pay | Admitting: Surgery

## 2022-11-06 DIAGNOSIS — I10 Essential (primary) hypertension: Secondary | ICD-10-CM | POA: Insufficient documentation

## 2022-11-06 DIAGNOSIS — Z8249 Family history of ischemic heart disease and other diseases of the circulatory system: Secondary | ICD-10-CM | POA: Insufficient documentation

## 2022-11-06 DIAGNOSIS — K8050 Calculus of bile duct without cholangitis or cholecystitis without obstruction: Secondary | ICD-10-CM

## 2022-11-06 DIAGNOSIS — K8010 Calculus of gallbladder with chronic cholecystitis without obstruction: Secondary | ICD-10-CM | POA: Diagnosis not present

## 2022-11-06 DIAGNOSIS — K8020 Calculus of gallbladder without cholecystitis without obstruction: Secondary | ICD-10-CM | POA: Diagnosis present

## 2022-11-06 SURGERY — CHOLECYSTECTOMY, ROBOT-ASSISTED, LAPAROSCOPIC
Anesthesia: General | Site: Abdomen

## 2022-11-06 MED ORDER — CEFAZOLIN SODIUM-DEXTROSE 2-4 GM/100ML-% IV SOLN
INTRAVENOUS | Status: AC
  Filled 2022-11-06: qty 100

## 2022-11-06 MED ORDER — INDOCYANINE GREEN 25 MG IV SOLR
1.2500 mg | Freq: Once | INTRAVENOUS | Status: DC
  Filled 2022-11-06: qty 10

## 2022-11-06 MED ORDER — ONDANSETRON HCL 4 MG/2ML IJ SOLN
INTRAMUSCULAR | Status: AC
  Filled 2022-11-06: qty 2

## 2022-11-06 MED ORDER — DEXAMETHASONE SODIUM PHOSPHATE 10 MG/ML IJ SOLN
INTRAMUSCULAR | Status: DC | PRN
  Administered 2022-11-06: 10 mg via INTRAVENOUS

## 2022-11-06 MED ORDER — FENTANYL CITRATE (PF) 250 MCG/5ML IJ SOLN
INTRAMUSCULAR | Status: AC
  Filled 2022-11-06: qty 5

## 2022-11-06 MED ORDER — KETOROLAC TROMETHAMINE 30 MG/ML IJ SOLN
INTRAMUSCULAR | Status: DC | PRN
  Administered 2022-11-06: 30 mg via INTRAVENOUS

## 2022-11-06 MED ORDER — ONDANSETRON HCL 4 MG/2ML IJ SOLN
INTRAMUSCULAR | Status: DC | PRN
  Administered 2022-11-06: 4 mg via INTRAVENOUS

## 2022-11-06 MED ORDER — ROCURONIUM BROMIDE 100 MG/10ML IV SOLN
INTRAVENOUS | Status: DC | PRN
  Administered 2022-11-06: 10 mg via INTRAVENOUS
  Administered 2022-11-06: 40 mg via INTRAVENOUS

## 2022-11-06 MED ORDER — HYDROCODONE-ACETAMINOPHEN 5-325 MG PO TABS: 1.0000 | ORAL_TABLET | Freq: Four times a day (QID) | ORAL | 0 refills | Status: AC | PRN

## 2022-11-06 MED ORDER — LIDOCAINE HCL (PF) 2 % IJ SOLN
INTRAMUSCULAR | Status: AC
  Filled 2022-11-06: qty 5

## 2022-11-06 MED ORDER — PROPOFOL 10 MG/ML IV BOLUS
INTRAVENOUS | Status: DC | PRN
  Administered 2022-11-06: 150 mg via INTRAVENOUS

## 2022-11-06 MED ORDER — MIDAZOLAM HCL 2 MG/2ML IJ SOLN
INTRAMUSCULAR | Status: AC
  Filled 2022-11-06: qty 2

## 2022-11-06 MED ORDER — LIDOCAINE-EPINEPHRINE (PF) 1 %-1:200000 IJ SOLN
INTRAMUSCULAR | Status: DC | PRN
  Administered 2022-11-06: 25 mL via INTRAMUSCULAR

## 2022-11-06 MED ORDER — FENTANYL CITRATE (PF) 100 MCG/2ML IJ SOLN
INTRAMUSCULAR | Status: DC | PRN
  Administered 2022-11-06 (×3): 50 ug via INTRAVENOUS

## 2022-11-06 MED ORDER — MIDAZOLAM HCL 2 MG/2ML IJ SOLN
INTRAMUSCULAR | Status: DC | PRN
  Administered 2022-11-06: 2 mg via INTRAVENOUS

## 2022-11-06 MED ORDER — FAMOTIDINE 20 MG PO TABS
ORAL_TABLET | ORAL | Status: AC
  Administered 2022-11-06: 20 mg via ORAL
  Filled 2022-11-06: qty 1

## 2022-11-06 MED ORDER — PROPOFOL 10 MG/ML IV BOLUS
INTRAVENOUS | Status: AC
  Filled 2022-11-06: qty 20

## 2022-11-06 MED ORDER — DOCUSATE SODIUM 100 MG PO CAPS: 100.0000 mg | ORAL_CAPSULE | Freq: Two times a day (BID) | ORAL | 0 refills | Status: AC | PRN

## 2022-11-06 MED ORDER — ACETAMINOPHEN 325 MG PO TABS: 650.0000 mg | ORAL_TABLET | Freq: Three times a day (TID) | ORAL | 0 refills | Status: AC | PRN

## 2022-11-06 MED ORDER — IBUPROFEN 800 MG PO TABS: 800.0000 mg | ORAL_TABLET | Freq: Three times a day (TID) | ORAL | 0 refills | Status: DC | PRN

## 2022-11-06 MED ORDER — FENTANYL CITRATE (PF) 100 MCG/2ML IJ SOLN
25.0000 ug | INTRAMUSCULAR | Status: DC | PRN
  Administered 2022-11-06: 50 ug via INTRAVENOUS

## 2022-11-06 MED ORDER — ROCURONIUM BROMIDE 10 MG/ML (PF) SYRINGE
PREFILLED_SYRINGE | INTRAVENOUS | Status: AC
  Filled 2022-11-06: qty 10

## 2022-11-06 MED ORDER — LIDOCAINE-EPINEPHRINE (PF) 1 %-1:200000 IJ SOLN
INTRAMUSCULAR | Status: AC
  Filled 2022-11-06: qty 30

## 2022-11-06 MED ORDER — BUPIVACAINE HCL (PF) 0.5 % IJ SOLN
INTRAMUSCULAR | Status: AC
  Filled 2022-11-06: qty 30

## 2022-11-06 MED ORDER — ACETAMINOPHEN 10 MG/ML IV SOLN
INTRAVENOUS | Status: DC | PRN
  Administered 2022-11-06: 1000 mg via INTRAVENOUS

## 2022-11-06 MED ORDER — OXYCODONE HCL 5 MG PO TABS
5.0000 mg | ORAL_TABLET | Freq: Once | ORAL | Status: AC
  Administered 2022-11-06: 5 mg via ORAL

## 2022-11-06 MED ORDER — SUGAMMADEX SODIUM 200 MG/2ML IV SOLN
INTRAVENOUS | Status: DC | PRN
  Administered 2022-11-06: 200 mg via INTRAVENOUS

## 2022-11-06 MED ORDER — ORAL CARE MOUTH RINSE: 15.0000 mL | Freq: Once | OROMUCOSAL | Status: AC

## 2022-11-06 MED ORDER — CHLORHEXIDINE GLUCONATE 0.12 % MT SOLN
OROMUCOSAL | Status: AC
  Administered 2022-11-06: 15 mL via OROMUCOSAL
  Filled 2022-11-06: qty 15

## 2022-11-06 MED ORDER — LIDOCAINE HCL (CARDIAC) PF 100 MG/5ML IV SOSY
PREFILLED_SYRINGE | INTRAVENOUS | Status: DC | PRN
  Administered 2022-11-06: 100 mg via INTRAVENOUS

## 2022-11-06 MED ORDER — CHLORHEXIDINE GLUCONATE 0.12 % MT SOLN: 15.0000 mL | Freq: Once | OROMUCOSAL | Status: AC

## 2022-11-06 MED ORDER — KETOROLAC TROMETHAMINE 30 MG/ML IJ SOLN
INTRAMUSCULAR | Status: AC
  Filled 2022-11-06: qty 1

## 2022-11-06 MED ORDER — ACETAMINOPHEN 10 MG/ML IV SOLN
INTRAVENOUS | Status: AC
  Filled 2022-11-06: qty 100

## 2022-11-06 MED ORDER — CHLORHEXIDINE GLUCONATE CLOTH 2 % EX PADS
6.0000 | MEDICATED_PAD | Freq: Once | CUTANEOUS | Status: AC
  Administered 2022-11-06: 6 via TOPICAL

## 2022-11-06 MED ORDER — PROMETHAZINE HCL 25 MG/ML IJ SOLN: 6.2500 mg | INTRAMUSCULAR | Status: DC | PRN

## 2022-11-06 MED ORDER — CEFAZOLIN SODIUM-DEXTROSE 2-4 GM/100ML-% IV SOLN
2.0000 g | INTRAVENOUS | Status: AC
  Administered 2022-11-06: 2 g via INTRAVENOUS

## 2022-11-06 MED ORDER — FAMOTIDINE 20 MG PO TABS: 20.0000 mg | ORAL_TABLET | Freq: Once | ORAL | Status: AC

## 2022-11-06 MED ORDER — DEXAMETHASONE SODIUM PHOSPHATE 10 MG/ML IJ SOLN
INTRAMUSCULAR | Status: AC
  Filled 2022-11-06: qty 1

## 2022-11-06 MED ORDER — INDOCYANINE GREEN 25 MG IV SOLR
INTRAVENOUS | Status: DC | PRN
  Administered 2022-11-06: 1.25 mg via INTRAVENOUS

## 2022-11-06 MED ORDER — LACTATED RINGERS IV SOLN: INTRAVENOUS | Status: DC

## 2022-11-06 MED ORDER — FENTANYL CITRATE (PF) 100 MCG/2ML IJ SOLN
INTRAMUSCULAR | Status: AC
  Administered 2022-11-06: 50 ug via INTRAVENOUS
  Filled 2022-11-06: qty 2

## 2022-11-06 MED ORDER — OXYCODONE HCL 5 MG PO TABS
ORAL_TABLET | ORAL | Status: AC
  Filled 2022-11-06: qty 1

## 2022-11-06 SURGICAL SUPPLY — 55 items
ADH SKN CLS APL DERMABOND .7 (GAUZE/BANDAGES/DRESSINGS) ×2
ANCHOR TIS RET SYS 235ML (MISCELLANEOUS) ×2 IMPLANT
BAG PRESSURE INF REUSE 1000 (BAG) IMPLANT
BAG TISS RTRVL C235 10X14 (MISCELLANEOUS)
BLADE SURG SZ11 CARB STEEL (BLADE) ×2 IMPLANT
CANNULA REDUC XI 12-8 STAPL (CANNULA) ×2
CANNULA REDUCER 12-8 DVNC XI (CANNULA) ×2 IMPLANT
CATH REDDICK CHOLANGI 4FR 50CM (CATHETERS) IMPLANT
CLIP LIGATING HEMO O LOK GREEN (MISCELLANEOUS) ×2 IMPLANT
DERMABOND ADVANCED .7 DNX12 (GAUZE/BANDAGES/DRESSINGS) ×2 IMPLANT
DRAPE ARM DVNC X/XI (DISPOSABLE) ×8 IMPLANT
DRAPE C-ARM XRAY 36X54 (DRAPES) IMPLANT
DRAPE COLUMN DVNC XI (DISPOSABLE) ×2 IMPLANT
DRAPE DA VINCI XI ARM (DISPOSABLE) ×8
DRAPE DA VINCI XI COLUMN (DISPOSABLE) ×2
ELECT CAUTERY BLADE 6.4 (BLADE) ×2 IMPLANT
ELECT REM PT RETURN 9FT ADLT (ELECTROSURGICAL) ×2
ELECTRODE REM PT RTRN 9FT ADLT (ELECTROSURGICAL) ×2 IMPLANT
GLOVE BIOGEL PI IND STRL 7.0 (GLOVE) ×4 IMPLANT
GLOVE SURG SYN 6.5 ES PF (GLOVE) ×6 IMPLANT
GLOVE SURG SYN 6.5 PF PI (GLOVE) ×4 IMPLANT
GOWN STRL REUS W/ TWL LRG LVL3 (GOWN DISPOSABLE) ×6 IMPLANT
GOWN STRL REUS W/TWL LRG LVL3 (GOWN DISPOSABLE) ×6
GRASPER SUT TROCAR 14GX15 (MISCELLANEOUS) IMPLANT
IRRIGATOR SUCT 8 DISP DVNC XI (IRRIGATION / IRRIGATOR) IMPLANT
IRRIGATOR SUCTION 8MM XI DISP (IRRIGATION / IRRIGATOR)
IV NS 1000ML (IV SOLUTION)
IV NS 1000ML BAXH (IV SOLUTION) IMPLANT
KIT TURNOVER KIT A (KITS) ×2 IMPLANT
LABEL OR SOLS (LABEL) ×2 IMPLANT
MANIFOLD NEPTUNE II (INSTRUMENTS) ×2 IMPLANT
NDL INSUFFLATION 14GA 120MM (NEEDLE) ×2 IMPLANT
NEEDLE HYPO 22GX1.5 SAFETY (NEEDLE) ×2 IMPLANT
NEEDLE INSUFFLATION 14GA 120MM (NEEDLE) ×2 IMPLANT
NS IRRIG 500ML POUR BTL (IV SOLUTION) ×2 IMPLANT
OBTURATOR OPTICAL STANDARD 8MM (TROCAR) ×2
OBTURATOR OPTICAL STND 8 DVNC (TROCAR) ×2
OBTURATOR OPTICALSTD 8 DVNC (TROCAR) ×2 IMPLANT
PACK LAP CHOLECYSTECTOMY (MISCELLANEOUS) ×2 IMPLANT
PENCIL SMOKE EVACUATOR (MISCELLANEOUS) ×2 IMPLANT
SEAL CANN UNIV 5-8 DVNC XI (MISCELLANEOUS) ×6 IMPLANT
SEAL XI 5MM-8MM UNIVERSAL (MISCELLANEOUS) ×6
SET TUBE SMOKE EVAC HIGH FLOW (TUBING) ×2 IMPLANT
SOLUTION ELECTROLUBE (MISCELLANEOUS) ×2 IMPLANT
SPIKE FLUID TRANSFER (MISCELLANEOUS) ×4 IMPLANT
STAPLER CANNULA SEAL DVNC XI (STAPLE) ×2 IMPLANT
STAPLER CANNULA SEAL XI (STAPLE) ×2
SUT MNCRL 4-0 (SUTURE) ×4
SUT MNCRL 4-0 27XMFL (SUTURE) ×4
SUT VICRYL 0 UR6 27IN ABS (SUTURE) ×2 IMPLANT
SUTURE MNCRL 4-0 27XMF (SUTURE) ×4 IMPLANT
SYR 30ML LL (SYRINGE) IMPLANT
SYSTEM WECK SHIELD CLOSURE (TROCAR) IMPLANT
TRAP FLUID SMOKE EVACUATOR (MISCELLANEOUS) ×2 IMPLANT
WATER STERILE IRR 500ML POUR (IV SOLUTION) ×2 IMPLANT

## 2022-11-06 NOTE — Interval H&P Note (Signed)
History and Physical Interval Note:  11/06/2022 7:09 AM  Cindy Hays  has presented today for surgery, with the diagnosis of Calculus of gallbladder with biliary obstruction but without cholecystitis K80.21.  The various methods of treatment have been discussed with the patient and family. After consideration of risks, benefits and other options for treatment, the patient has consented to  Procedure(s): XI ROBOTIC ASSISTED LAPAROSCOPIC CHOLECYSTECTOMY (N/A) [place] (ICG) (N/A) as a surgical intervention.  The patient's history has been reviewed, patient examined, no change in status, stable for surgery.  I have reviewed the patient's chart and labs.  Questions were answered to the patient's satisfaction.     Cindy Hays

## 2022-11-06 NOTE — Discharge Instructions (Addendum)
Laparoscopic Cholecystectomy, Care After This sheet gives you information about how to care for yourself after your procedure. Your doctor may also give you more specific instructions. If you have problems or questions, contact your doctor. Follow these instructions at home: Care for cuts from surgery (incisions)  Follow instructions from your doctor about how to take care of your cuts from surgery. Make sure you: Wash your hands with soap and water before you change your bandage (dressing). If you cannot use soap and water, use hand sanitizer. Change your bandage as told by your doctor. Leave stitches (sutures), skin glue, or skin tape (adhesive) strips in place. They may need to stay in place for 2 weeks or longer. If tape strips get loose and curl up, you may trim the loose edges. Do not remove tape strips completely unless your doctor says it is okay. Do not take baths, swim, or use a hot tub until your doctor says it is okay. OK TO SHOWER 24HRS AFTER YOUR SURGERY.  Check your surgical cut area every day for signs of infection. Check for: More redness, swelling, or pain. More fluid or blood. Warmth. Pus or a bad smell. Activity Do not drive or use heavy machinery while taking prescription pain medicine. Do not play contact sports until your doctor says it is okay. Do not drive for 24 hours if you were given a medicine to help you relax (sedative). Rest as needed. Do not return to work or school until your doctor says it is okay. General instructions  tylenol and advil as needed for discomfort.  Please alternate between the two every four hours as needed for pain.    Use narcotics, if prescribed, only when tylenol and motrin is not enough to control pain.  325-665m every 8hrs to max of 30021m24hrs (including the 32585mn every norco dose) for the tylenol.    Advil up to 800m44mr dose every 8hrs as needed for pain.   To prevent or treat constipation while you are taking prescription  pain medicine, your doctor may recommend that you: Drink enough fluid to keep your pee (urine) clear or pale yellow. Take over-the-counter or prescription medicines. Eat foods that are high in fiber, such as fresh fruits and vegetables, whole grains, and beans. Limit foods that are high in fat and processed sugars, such as fried and sweet foods. Contact a doctor if: You develop a rash. You have more redness, swelling, or pain around your surgical cuts. You have more fluid or blood coming from your surgical cuts. Your surgical cuts feel warm to the touch. You have pus or a bad smell coming from your surgical cuts. You have a fever. One or more of your surgical cuts breaks open. You have trouble breathing. You have chest pain. You have pain that is getting worse in your shoulders. You faint or feel dizzy when you stand. You have very bad pain in your belly (abdomen). You are sick to your stomach (nauseous) for more than one day. You have throwing up (vomiting) that lasts for more than one day. You have leg pain. This information is not intended to replace advice given to you by your health care provider. Make sure you discuss any questions you have with your health care provider. Document Released: 08/12/2008 Document Revised: 05/24/2016 Document Reviewed: 04/21/2016 Elsevier Interactive Patient Education  2019 [place] drugs that you were given will stay in your system until tomorrow so  for the next 24 hours you should not:  Drive an automobile Make any legal decisions Drink any alcoholic beverage   You may resume regular meals tomorrow.  Today it is better to start with liquids and gradually work up to solid foods.  You may eat anything you prefer, but it is better to start with liquids, then soup and crackers, and gradually work up to solid foods.   Please notify your doctor immediately if you have any unusual  bleeding, trouble breathing, redness and pain at the surgery site, drainage, fever, or pain not relieved by medication.    Your post-operative visit with Dr.                                       is: Date:                        Time:    Please call to schedule your post-operative visit.  Additional Instructions:

## 2022-11-06 NOTE — Op Note (Addendum)
Preoperative diagnosis:  gallstones  Postoperative diagnosis: same as above  Procedure: Robotic assisted Laparoscopic Cholecystectomy.   Anesthesia: GETA   Surgeon: Benjamine Sprague  Specimen: Gallbladder  Complications: None  EBL: 33m  Wound Classification: Clean Contaminated  Indications: see HPI  Findings: Critical view of safety noted Cystic duct and artery identified, ligated and divided, clips remained intact at end of procedure Adequate hemostasis  Description of procedure:  The patient was placed on the operating table in the supine position. SCDs placed, pre-op abx administered.  General anesthesia was induced and OG tube placed by anesthesia. A time-out was completed verifying correct patient, procedure, site, positioning, and implant(s) and/or special equipment prior to beginning this procedure. The abdomen was prepped and draped in the usual sterile fashion.    Veress needle was placed at the Palmer's point and insufflation was started after confirming a positive saline drop test and no immediate increase in abdominal pressure.  After reaching 15 mm, the Veress needle was removed and a 540mport placed through same area via optiview technique.  The abdomen was inspected and no abnormalities or injuries were found.  Under direct vision, ports were placed in the following locations: 8 mm port was placed  under umbilicus measured 2046NGrom gallbladder.  One 12 mm patient left of the umbilicus, 8cm from the umbilical port, one 8 mm port placed to the patient right of the umbilical port 8 cm apart.  1 additional 8 mm port placed lateral to the 1275mort.  Once ports were placed, The table was placed in the reverse Trendelenburg position with the right side up. The Xi platform was brought into the operative field and docked to the ports successfully.  An endoscope was placed through the umbilical port, fenestrated grasper through the adjacent patient right port, prograsp to the far  patient left port, and then a hook cautery in the left port.  The dome of the gallbladder was grasped with prograsp, passed and retracted over the dome of the liver. Adhesions between the gallbladder and omentum, duodenum and transverse colon were lysed via hook cautery. The infundibulum was grasped with the fenestrated grasper and retracted toward the right lower quadrant. This maneuver exposed Calot's triangle. The peritoneum overlying the gallbladder infundibulum was then dissected  and the cystic duct and cystic artery identified.  Critical view of safety with the liver bed clearly visible behind the duct and artery with no additional structures noted.  The cystic duct and cystic artery clipped and divided close to the gallbladder.     The gallbladder was then dissected from its peritoneal and liver bed attachments by electrocautery. Hemostasis was checked prior to removing the hook cautery and the Endo Catch bag was then placed through the 12 mm port and the gallbladder was removed.  The gallbladder was passed off the table as a specimen. There was no evidence of bleeding from the gallbladder fossa or cystic artery or leakage of the bile from the cystic duct stump. The 12 mm port site closed with PMI using 0 vicryl under direct vision.  Abdomen desufflated and secondary trocars were removed under direct vision. No bleeding was noted. All skin incisions then closed with subcuticular sutures of 4-0 monocryl and dressed with topical skin adhesive. The orogastric tube was removed and patient extubated.  The patient tolerated the procedure well and was taken to the postanesthesia care unit in stable condition.  All sponge and instrument count correct at end of procedure.

## 2022-11-06 NOTE — Anesthesia Procedure Notes (Signed)
Procedure Name: Intubation Date/Time: 11/06/2022 9:00 AM  Performed by: Hilbert Odor, CRNAPre-anesthesia Checklist: Patient identified, Patient being monitored, Timeout performed, Emergency Drugs available and Suction available Patient Re-evaluated:Patient Re-evaluated prior to induction Oxygen Delivery Method: Circle system utilized Preoxygenation: Pre-oxygenation with 100% oxygen Induction Type: IV induction Ventilation: Mask ventilation without difficulty Laryngoscope Size: Mac and 3 Grade View: Grade I Tube type: Oral Tube size: 7.5 mm Number of attempts: 1 Airway Equipment and Method: Stylet Placement Confirmation: ETT inserted through vocal cords under direct vision, positive ETCO2 and breath sounds checked- equal and bilateral Secured at: 21 cm Tube secured with: Tape Dental Injury: Teeth and Oropharynx as per pre-operative assessment

## 2022-11-06 NOTE — Anesthesia Postprocedure Evaluation (Signed)
Anesthesia Post Note  Patient: Cindy Hays  Procedure(s) Performed: XI ROBOTIC ASSISTED LAPAROSCOPIC CHOLECYSTECTOMY (Abdomen) INDOCYANINE GREEN FLUORESCENCE IMAGING (ICG)  Patient location during evaluation: PACU Anesthesia Type: General Level of consciousness: awake and alert Pain management: pain level controlled Vital Signs Assessment: post-procedure vital signs reviewed and stable Respiratory status: spontaneous breathing, nonlabored ventilation, respiratory function stable and patient connected to nasal cannula oxygen Cardiovascular status: blood pressure returned to baseline and stable Postop Assessment: no apparent nausea or vomiting Anesthetic complications: no   No notable events documented.   Last Vitals:  Vitals:   11/06/22 1050 11/06/22 1111  BP:  126/80  Pulse: 92 81  Resp: 16 18  Temp:  36.9 C  SpO2: 98% 100%    Last Pain:  Vitals:   11/06/22 1111  TempSrc: Temporal  PainSc: 9                  Martha Clan

## 2022-11-06 NOTE — Anesthesia Preprocedure Evaluation (Signed)
Anesthesia Evaluation  Patient identified by MRN, date of birth, ID band Patient awake    Reviewed: Allergy & Precautions, NPO status , Patient's Chart, lab work & pertinent test results  History of Anesthesia Complications Negative for: history of anesthetic complications  Airway Mallampati: III  TM Distance: >3 FB Neck ROM: full    Dental  (+) Chipped, Poor Dentition, Missing, Dental Advidsory Given   Pulmonary neg pulmonary ROS, neg shortness of breath   Pulmonary exam normal        Cardiovascular Exercise Tolerance: Good hypertension, (-) angina (-) Past MI, (-) Cardiac Stents and (-) DOE Normal cardiovascular exam(-) dysrhythmias (-) Valvular Problems/Murmurs     Neuro/Psych  Headaches, neg Seizures  Neuromuscular disease  negative psych ROS   GI/Hepatic negative GI ROS, Neg liver ROS,neg GERD  ,,  Endo/Other  negative endocrine ROS    Renal/GU      Musculoskeletal   Abdominal   Peds  Hematology negative hematology ROS (+)   Anesthesia Other Findings Past Medical History: No date: Anemia No date: B12 deficiency No date: History of gestational hypertension No date: Hypertension No date: Preterm labor No date: Uterine leiomyoma  Past Surgical History: No date: NO PAST SURGERIES  BMI    Body Mass Index: 37.79 kg/m      Reproductive/Obstetrics negative OB ROS                             Anesthesia Physical Anesthesia Plan  ASA: 2  Anesthesia Plan: General   Post-op Pain Management:    Induction: Intravenous  PONV Risk Score and Plan: Ondansetron, Dexamethasone, Midazolam and Treatment may vary due to age or medical condition  Airway Management Planned: Oral ETT  Additional Equipment:   Intra-op Plan:   Post-operative Plan: Extubation in OR  Informed Consent: I have reviewed the patients History and Physical, chart, labs and discussed the procedure including the  risks, benefits and alternatives for the proposed anesthesia with the patient or authorized representative who has indicated his/her understanding and acceptance.     Dental Advisory Given  Plan Discussed with: Anesthesiologist, CRNA and Surgeon  Anesthesia Plan Comments: (Patient consented for risks of anesthesia including but not limited to:  - adverse reactions to medications - damage to eyes, teeth, lips or other oral mucosa - nerve damage due to positioning  - sore throat or hoarseness - Damage to heart, brain, nerves, lungs, other parts of body or loss of life  Patient voiced understanding.)        Anesthesia Quick Evaluation

## 2022-11-06 NOTE — Transfer of Care (Signed)
Immediate Anesthesia Transfer of Care Note  Patient: Cindy Hays  Procedure(s) Performed: XI ROBOTIC ASSISTED LAPAROSCOPIC CHOLECYSTECTOMY (Abdomen) INDOCYANINE GREEN FLUORESCENCE IMAGING (ICG)  Patient Location: PACU  Anesthesia Type:General  Level of Consciousness: drowsy and patient cooperative  Airway & Oxygen Therapy: Patient Spontanous Breathing and Patient connected to nasal cannula oxygen  Post-op Assessment: Report given to RN and Post -op Vital signs reviewed and stable  Post vital signs: Reviewed and stable  Last Vitals:  Vitals Value Taken Time  BP 115/72 11/06/22 1016  Temp 37.5 C 11/06/22 1016  Pulse 87 11/06/22 1016  Resp 20 11/06/22 1016  SpO2 98 % 11/06/22 1016  Vitals shown include unvalidated device data.  Last Pain:  Vitals:   11/06/22 1016  TempSrc: Tympanic  PainSc: 0-No pain         Complications: No notable events documented.

## 2022-11-07 LAB — SURGICAL PATHOLOGY

## 2022-11-08 ENCOUNTER — Emergency Department: Payer: 59

## 2022-11-08 ENCOUNTER — Other Ambulatory Visit: Payer: Self-pay

## 2022-11-08 ENCOUNTER — Emergency Department
Admission: EM | Admit: 2022-11-08 | Discharge: 2022-11-08 | Disposition: A | Discharge status: Home / Self Care | Payer: 59 | Attending: Emergency Medicine | Admitting: Emergency Medicine

## 2022-11-08 DIAGNOSIS — R110 Nausea: Secondary | ICD-10-CM | POA: Insufficient documentation

## 2022-11-08 DIAGNOSIS — G8918 Other acute postprocedural pain: Secondary | ICD-10-CM | POA: Diagnosis not present

## 2022-11-08 DIAGNOSIS — Z20822 Contact with and (suspected) exposure to covid-19: Secondary | ICD-10-CM | POA: Diagnosis not present

## 2022-11-08 DIAGNOSIS — R051 Acute cough: Secondary | ICD-10-CM | POA: Diagnosis not present

## 2022-11-08 LAB — COMPREHENSIVE METABOLIC PANEL
ALT: 60 U/L — ABNORMAL HIGH (ref 0–44)
AST: 65 U/L — ABNORMAL HIGH (ref 15–41)
Albumin: 3.6 g/dL (ref 3.5–5.0)
Alkaline Phosphatase: 54 U/L (ref 38–126)
Anion gap: 7 (ref 5–15)
BUN: 8 mg/dL (ref 6–20)
CO2: 24 mmol/L (ref 22–32)
Calcium: 8.8 mg/dL — ABNORMAL LOW (ref 8.9–10.3)
Chloride: 108 mmol/L (ref 98–111)
Creatinine, Ser: 0.72 mg/dL (ref 0.44–1.00)
GFR, Estimated: >60 mL/min (ref >60)
Glucose, Bld: 114 mg/dL — ABNORMAL HIGH (ref 70–99)
Potassium: 3.6 mmol/L (ref 3.5–5.1)
Sodium: 139 mmol/L (ref 135–145)
Total Bilirubin: 0.8 mg/dL (ref 0.3–1.2)
Total Protein: 7.6 g/dL (ref 6.5–8.1)

## 2022-11-08 LAB — CBC WITH DIFFERENTIAL/PLATELET
Abs Immature Granulocytes: 0.03 10*3/uL (ref 0.00–0.07)
Basophils Absolute: 0 10*3/uL (ref 0.0–0.1)
Basophils Relative: 0 %
Eosinophils Absolute: 0 10*3/uL (ref 0.0–0.5)
Eosinophils Relative: 0 %
HCT: 34.1 % — ABNORMAL LOW (ref 36.0–46.0)
Hemoglobin: 10.1 g/dL — ABNORMAL LOW (ref 12.0–15.0)
Immature Granulocytes: 0 %
Lymphocytes Relative: 25 %
Lymphs Abs: 2.2 10*3/uL (ref 0.7–4.0)
MCH: 23.8 pg — ABNORMAL LOW (ref 26.0–34.0)
MCHC: 29.6 g/dL — ABNORMAL LOW (ref 30.0–36.0)
MCV: 80.4 fL (ref 80.0–100.0)
Monocytes Absolute: 0.6 10*3/uL (ref 0.1–1.0)
Monocytes Relative: 7 %
Neutro Abs: 6 10*3/uL (ref 1.7–7.7)
Neutrophils Relative %: 68 %
Platelets: 320 10*3/uL (ref 150–400)
RBC: 4.24 MIL/uL (ref 3.87–5.11)
RDW: 16.6 % — ABNORMAL HIGH (ref 11.5–15.5)
WBC: 8.9 10*3/uL (ref 4.0–10.5)
nRBC: 0 % (ref 0.0–0.2)

## 2022-11-08 LAB — PREGNANCY, URINE: Preg Test, Ur: NEGATIVE

## 2022-11-08 LAB — URINALYSIS, ROUTINE W REFLEX MICROSCOPIC
Bilirubin Urine: NEGATIVE
Glucose, UA: NEGATIVE mg/dL
Hgb urine dipstick: NEGATIVE
Ketones, ur: NEGATIVE mg/dL
Leukocytes,Ua: NEGATIVE
Nitrite: NEGATIVE
Protein, ur: NEGATIVE mg/dL
Specific Gravity, Urine: 1.008 (ref 1.005–1.030)
pH: 8 (ref 5.0–8.0)

## 2022-11-08 LAB — RESP PANEL BY RT-PCR (RSV, FLU A&B, COVID)  RVPGX2
Influenza A by PCR: NEGATIVE
Influenza B by PCR: NEGATIVE
Resp Syncytial Virus by PCR: NEGATIVE
SARS Coronavirus 2 by RT PCR: NEGATIVE

## 2022-11-08 LAB — LACTIC ACID, PLASMA
Lactic Acid, Venous: 1.2 mmol/L (ref 0.5–1.9)
Lactic Acid, Venous: 0.9 mmol/L (ref 0.5–1.9)

## 2022-11-08 LAB — LIPASE, BLOOD: Lipase: 28 U/L (ref 11–51)

## 2022-11-08 MED ORDER — IOHEXOL 300 MG/ML  SOLN
100.0000 mL | Freq: Once | INTRAMUSCULAR | Status: AC | PRN
  Administered 2022-11-08: 100 mL via INTRAVENOUS

## 2022-11-08 MED ORDER — SODIUM CHLORIDE 0.9 % IV BOLUS
1000.0000 mL | Freq: Once | INTRAVENOUS | Status: AC
  Administered 2022-11-08: 1000 mL via INTRAVENOUS

## 2022-11-08 MED ORDER — IBUPROFEN 600 MG PO TABS
600.0000 mg | ORAL_TABLET | Freq: Once | ORAL | Status: AC
  Administered 2022-11-08: 600 mg via ORAL
  Filled 2022-11-08: qty 1

## 2022-11-08 MED ORDER — ONDANSETRON HCL 4 MG/2ML IJ SOLN
4.0000 mg | Freq: Once | INTRAMUSCULAR | Status: AC
  Administered 2022-11-08: 4 mg via INTRAVENOUS
  Filled 2022-11-08: qty 2

## 2022-11-08 MED ORDER — ACETAMINOPHEN 500 MG PO TABS
1000.0000 mg | ORAL_TABLET | Freq: Once | ORAL | Status: AC
  Administered 2022-11-08: 1000 mg via ORAL
  Filled 2022-11-08: qty 2

## 2022-11-08 MED ORDER — ONDANSETRON HCL 4 MG PO TABS: 4.0000 mg | ORAL_TABLET | Freq: Every day | ORAL | 1 refills | Status: AC | PRN

## 2022-11-08 NOTE — Discharge Instructions (Signed)
Take acetaminophen 650 mg and ibuprofen 400 mg every 6 hours for pain.  Take with food. Take Zofran as needed for nausea.  Drink plenty of fluids to stay well-hydrated, find Pedialyte or similar electrolyte rehydration formulas at your local pharmacy.    Follow-up with Dr. Lysle Pearl for postsurgery follow-up. Check MyChart for the results of your viral testing.  Thank you for choosing [place] for your health care today!  Please see your primary doctor this week for a follow up appointment.   Sometimes, in the early stages of certain disease courses it is difficult to detect in the emergency department evaluation -- so, it is important that you continue to monitor your symptoms and call your doctor right away or return to the emergency department if you develop any new or worsening symptoms.  Please go to the following website to schedule new (and existing) patient appointments:   [email or web address]  If you do not have a primary doctor try calling the following clinics to establish care:  If you have insurance:  [facility] [phone] [place] [address]   [facility]  [phone] [address] [place] [address]   If you do not have insurance:  [facility]  [phone] [address] [place] [address]   The following is another list of primary care offices in the area who are accepting new patients at this time.  Please reach out to one of them directly and let them know you would like to schedule an appointment to follow up on an Emergency Department visit, and/or to establish a new primary care provider (PCP).  There are likely other primary care clinics in the are who are accepting new patients, but this is an excellent place to start:  [place] physician: Dr Lavon Paganini [address] [place], [place] [address] [phone]  [facility] Lead Physician: Dr Steele Sizer [address], [place], [place] [address] [phone]  [place] Physician: Dr Park Liter [address] [place], [place] [address] [phone]  [facility] Lead Physician: Dr Dewaine Oats [place], [place], [place] [address] [phone]  [facility] Physician: Dr Halina Maidens [address] [place], [place] [address] [phone]   It was my pleasure to care for you today.   Hoover Brunette Jacelyn Grip, MD

## 2022-11-08 NOTE — ED Triage Notes (Signed)
Pt presents via POV. Reports s/o cholecystectomy. C/o post operative pain, N/V. Reports pain in lower abd. Reports increased swelling to abd.

## 2022-11-08 NOTE — ED Provider Notes (Signed)
[facility] Provider Note    Event Date/Time   First MD Initiated Contact with Patient 11/08/22 1846     (approximate)   History   Post-op Problem   HPI  Cindy Hays is a 41 y.o. female   Past medical history of laparoscopic cholecystectomy performed on 11/06/2022 who presents to the emergency department with poor p.o. intake nausea and postoperative pain in the abdomen.  She also has had a cough and no fever.  Has been taking her narcotic pain medication which she thinks is causing her to feel nauseated.  She has been feeling fatigued.  She has no other acute medical complaints.  Bowel movements have been normal.  No urinary complaints.  No vomiting.  History was obtained via patient.  I reviewed external medical notes including operative note on 12/21 for cholecystectomy laparoscopic Dr. Lysle Pearl      Physical Exam   Triage Vital Signs: ED Triage Vitals  Enc Vitals Group     BP 11/08/22 1635 (!) 140/81     Pulse Rate 11/08/22 1635 (!) 112     Resp 11/08/22 1635 16     Temp 11/08/22 1635 98.5 F (36.9 C)     Temp Source 11/08/22 1635 Oral     SpO2 11/08/22 1635 100 %     Weight --      Height --      Head Circumference --      Peak Flow --      Pain Score 11/08/22 1637 8     Pain Loc --      Pain Edu? --      Excl. in [place]? --     Most recent vital signs: Vitals:   11/08/22 1635  BP: (!) 140/81  Pulse: (!) 112  Resp: 16  Temp: 98.5 F (36.9 C)  SpO2: 100%    General: Awake, no distress.  CV:  Good peripheral perfusion.  Resp:  Normal effort.  Abd:  No distention.  Other:  Lungs clear to auscultation bilateral without focality wheezing or rales.  No respiratory distress, no hypoxemia.  She is mildly tachycardic 110 and mildly hypertensive 140/80.  She is afebrile.  Her abdomen is soft but she does have some mild tenderness to palpation around the port sites.  The surgical sites appear clean dry and intact no overlying cellulitic  changes.   ED Results / Procedures / Treatments   Labs (all labs ordered are listed, but only abnormal results are displayed) Labs Reviewed  COMPREHENSIVE METABOLIC PANEL - Abnormal; Notable for the following components:      Result Value   Glucose, Bld 114 (*)    Calcium 8.8 (*)    AST 65 (*)    ALT 60 (*)    All other components within normal limits  CBC WITH DIFFERENTIAL/PLATELET - Abnormal; Notable for the following components:   Hemoglobin 10.1 (*)    HCT 34.1 (*)    MCH 23.8 (*)    MCHC 29.6 (*)    RDW 16.6 (*)    All other components within normal limits  URINALYSIS, ROUTINE W REFLEX MICROSCOPIC - Abnormal; Notable for the following components:   Color, Urine STRAW (*)    APPearance CLEAR (*)    All other components within normal limits  RESP PANEL BY RT-PCR (RSV, FLU A&B, COVID)  RVPGX2  LIPASE, BLOOD  LACTIC ACID, PLASMA  PREGNANCY, URINE  LACTIC ACID, PLASMA     I reviewed labs and they  are notable for H&H is at baseline hemoglobin 10.1 and no white blood cell count elevation.    RADIOLOGY I independently reviewed and interpreted chest x-ray and see no obvious focalities or pneumothorax   PROCEDURES:  Critical Care performed: No  Procedures   MEDICATIONS ORDERED IN ED: Medications  iohexol (OMNIPAQUE) 300 MG/ML solution 100 mL (100 mLs Intravenous Contrast Given 11/08/22 1807)  sodium chloride 0.9 % bolus 1,000 mL (1,000 mLs Intravenous New Bag/Given 11/08/22 2007)  ondansetron (ZOFRAN) injection 4 mg (4 mg Intravenous Given 11/08/22 2007)  acetaminophen (TYLENOL) tablet 1,000 mg (1,000 mg Oral Given 11/08/22 2007)  ibuprofen (ADVIL) tablet 600 mg (600 mg Oral Given 11/08/22 2007)     IMPRESSION / MDM / [place] / ED COURSE  I reviewed the triage vital signs and the nursing notes.                              Differential diagnosis includes, but is not limited to, postoperative complications including infection, dehiscence,  intra-abdominal infection or obstruction.  Respiratory symptoms/fatigue could represent bacterial pneumonia, viral URI.  Nausea could be postoperative changes as above or side effect of medication.   MDM: Fortunately the CT scan shows no acute findings aside from expected postoperative changes.  Chest x-ray shows no signs of pneumonia.  Patient is comfortable.  I advised to take Tylenol and ibuprofen for pain management and use narcotics only for breakthrough severe pain.  Prescribe Zofran.  Tolerating p.o. intake.  Viral swabs are pending.  She does not want to wait for the results of these test as she will decline antiviral medications after discussions of risks and benefits and side effects.  Since the patient is stable and workup was negative as above, she will be discharged at this time and will follow-up for postoperative follow-up with Dr. Lysle Pearl.  Return precautions given.   Patient's presentation is most consistent with acute presentation with potential threat to life or bodily function.       FINAL CLINICAL IMPRESSION(S) / ED DIAGNOSES   Final diagnoses:  Post-operative pain  Nausea  Acute cough     Rx / DC Orders   ED Discharge Orders          Ordered    ondansetron (ZOFRAN) 4 MG tablet  Daily PRN        11/08/22 2012             Note:  This document was prepared using Dragon voice recognition software and may include unintentional dictation errors.    Lucillie Garfinkel, MD 11/08/22 2013

## 2022-11-08 NOTE — ED Provider Triage Note (Signed)
Emergency Medicine Provider Triage Evaluation Note  Cindy Hays , a 41 y.o. female  was evaluated in triage.  Pt complains of left lower quadrant abdominal pain.  Patient has had a cholecystectomy performed 3 days ago.  Increasing left lower quadrant abdominal pain.  Patient feels nauseated but no emesis.  No diarrhea or constipation reported.  No urinary changes.  Patient cholecystectomy but is having worse pain on the left than right..  Review of Systems  Positive: .  Abdominal pain, nausea Negative: And cyst, diarrhea, urinary changes  Physical Exam  BP (!) 140/81   Pulse (!) 112   Temp 98.5 F (36.9 C) (Oral)   Resp 16   LMP 10/27/2021 (Exact Date) Comment: having depot shots  SpO2 100%  Gen:   Awake, no distress   Resp:  Normal effort  MSK:   Moves extremities without difficulty  Other:  Abdomen appears distended.  Exceptionally tender with guarding in the left upper and lower quadrants.  No right-sided tenderness.  Medical Decision Making  Medically screening exam initiated at 4:41 PM.  Appropriate orders placed.  Taren Dymek was informed that the remainder of the evaluation will be completed by another provider, this initial triage assessment does not replace that evaluation, and the importance of remaining in the ED until their evaluation is complete.  Patient labs labs, CT scan, urinalysis   Darletta Moll, PA-C 11/08/22 1641

## 2022-11-26 DIAGNOSIS — G43839 Menstrual migraine, intractable, without status migrainosus: Secondary | ICD-10-CM | POA: Diagnosis not present

## 2022-11-26 DIAGNOSIS — Z0000 Encounter for general adult medical examination without abnormal findings: Secondary | ICD-10-CM | POA: Diagnosis not present

## 2022-11-26 DIAGNOSIS — D508 Other iron deficiency anemias: Secondary | ICD-10-CM | POA: Diagnosis not present

## 2022-11-26 DIAGNOSIS — E669 Obesity, unspecified: Secondary | ICD-10-CM | POA: Diagnosis not present

## 2022-11-26 DIAGNOSIS — E782 Mixed hyperlipidemia: Secondary | ICD-10-CM | POA: Diagnosis not present

## 2022-11-26 DIAGNOSIS — N898 Other specified noninflammatory disorders of vagina: Secondary | ICD-10-CM | POA: Diagnosis not present

## 2022-12-15 ENCOUNTER — Other Ambulatory Visit: Payer: Self-pay

## 2022-12-15 MED ORDER — ZEPBOUND 2.5 MG/0.5ML ~~LOC~~ SOAJ
2.5000 mg | SUBCUTANEOUS | 1 refills | Status: DC
  Filled 2022-12-15 – 2023-05-07 (×2): qty 2, 28d supply, fill #0

## 2022-12-16 ENCOUNTER — Encounter: Payer: Self-pay | Admitting: Emergency Medicine

## 2022-12-16 ENCOUNTER — Other Ambulatory Visit: Payer: Self-pay

## 2022-12-16 ENCOUNTER — Ambulatory Visit
Admission: EM | Admit: 2022-12-16 | Discharge: 2022-12-16 | Disposition: A | Discharge status: Home / Self Care | Payer: 59 | Attending: Emergency Medicine | Admitting: Emergency Medicine

## 2022-12-16 DIAGNOSIS — Z1152 Encounter for screening for COVID-19: Secondary | ICD-10-CM | POA: Insufficient documentation

## 2022-12-16 DIAGNOSIS — R059 Cough, unspecified: Secondary | ICD-10-CM | POA: Insufficient documentation

## 2022-12-16 DIAGNOSIS — R197 Diarrhea, unspecified: Secondary | ICD-10-CM | POA: Diagnosis not present

## 2022-12-16 DIAGNOSIS — J101 Influenza due to other identified influenza virus with other respiratory manifestations: Secondary | ICD-10-CM | POA: Insufficient documentation

## 2022-12-16 LAB — RESP PANEL BY RT-PCR (RSV, FLU A&B, COVID)  RVPGX2
Influenza A by PCR: NEGATIVE
Influenza B by PCR: POSITIVE — AB
Resp Syncytial Virus by PCR: NEGATIVE
SARS Coronavirus 2 by RT PCR: NEGATIVE

## 2022-12-16 LAB — GROUP A STREP BY PCR: Group A Strep by PCR: NOT DETECTED

## 2022-12-16 MED ORDER — NYSTATIN 100000 UNIT/ML MT SUSP: 5.0000 mL | Freq: Four times a day (QID) | OROMUCOSAL | 0 refills | Status: DC | PRN

## 2022-12-16 MED ORDER — OSELTAMIVIR PHOSPHATE 75 MG PO CAPS: 75.0000 mg | ORAL_CAPSULE | Freq: Two times a day (BID) | ORAL | 0 refills | Status: DC

## 2022-12-16 MED ORDER — IBUPROFEN 800 MG PO TABS: 800.0000 mg | ORAL_TABLET | Freq: Three times a day (TID) | ORAL | 0 refills | Status: DC | PRN

## 2022-12-16 NOTE — ED Provider Notes (Signed)
[facility]    CSN: [id number] Arrival date & time: 12/16/22  0630      History   Chief Complaint Chief Complaint  Patient presents with   Cough   Headache   Fever   Sore Throat   Generalized Body Aches    HPI Cindy Hays is a 42 y.o. female.   Patient presents for evaluation of fever, nasal congestion, rhinorrhea, sore throat, cough, headaches and diarrhea beginning 3 days ago.  Fever peaking at 102.  Endorses fullness to the left ear only, occurring intermittently.  Cough is productive with green sputum.  Last occurrence of diarrhea this morning, stool described as soft and watery.  Decreased appetite but tolerating fluids.  Known exposure to influenza.  Has attempted use of Mucinex, ibuprofen, VapoRub and a using a sock home remedy with no relief.  Denies shortness of breath, wheezing.    Past Medical History:  Diagnosis Date   Anemia    B12 deficiency    Gallstones 10/2022   History of gestational hypertension    Hypertension    Preterm labor    Uterine leiomyoma     Patient Active Problem List   Diagnosis Date Noted   NSVT (nonsustained ventricular tachycardia) (HCC)    Chronic headaches 02/06/2020   Recurrent syncope 02/06/2020   Occipital neuralgia 02/06/2020   Sinus tachycardia 02/06/2020   ANA positive 02/06/2020   Syncope 02/06/2020   Iron deficiency anemia 10/28/2019   Pregnancy 08/04/2018   Indication for care in labor or delivery 08/03/2018   Labor and delivery indication for care or intervention 08/02/2018   Chronic hypertension with superimposed pre-eclampsia 07/30/2018   Chronic hypertension with superimposed preeclampsia 07/21/2018   Short interval between pregnancies affecting pregnancy in first trimester, antepartum 03/18/2018   Advanced maternal age in multigravida, first trimester 03/18/2018   Fetal demise, greater than 22 weeks, antepartum, single gestation 11/13/2017    Past Surgical History:  Procedure Laterality Date    ROBOTIC ASSISTED LAPAROSCOPIC HYSTERECTOMY AND SALPINGECTOMY Bilateral 12/20/2021   Procedure: XI ROBOTIC ASSISTED LAPAROSCOPIC HYSTERECTOMY AND SALPINGECTOMY;  Surgeon: Benjaman Kindler, MD;  Location: [facility] ORS;  Service: Gynecology;  Laterality: Bilateral;    OB History     Gravida  6   Para  6   Term  4   Preterm  1   AB      Living  5      SAB      IAB      Ectopic      Multiple  0   Live Births  1            Home Medications    Prior to Admission medications   Medication Sig Start Date End Date Taking? Authorizing Provider  loratadine (CLARITIN) 10 MG tablet Take 10 mg by mouth daily. 02/03/20 05/13/20  [provider]  Atogepant (QULIPTA PO) Take 1 tablet by mouth daily.    [provider]  cyclobenzaprine (FLEXERIL) 10 MG tablet Take 10 mg by mouth at bedtime as needed for muscle spasms.    [provider]  estradiol (ESTRACE) 0.1 MG/GM vaginal cream SMARTSIG:2 Gram(s) Vaginal Twice a Week    [provider]  ferrous sulfate 220 (44 Fe) MG/5ML solution Take 220 mg by mouth 2 (two) times daily with a meal.    [provider]  HYDROcodone-acetaminophen (NORCO) 5-325 MG tablet Take 1 tablet by mouth every 6 (six) hours as needed for up to 6 doses for moderate pain.  11/06/22   Lysle Pearl, Isami, DO  ibuprofen (ADVIL) 800 MG tablet Take 1 tablet (800 mg total) by mouth every 8 (eight) hours as needed for mild pain or moderate pain. 11/06/22   Lysle Pearl, Isami, DO  ondansetron (ZOFRAN) 4 MG tablet Take 1 tablet (4 mg total) by mouth daily as needed for nausea or vomiting. 11/08/22 11/08/23  Lucillie Garfinkel, MD  tirzepatide [facility]) 2.5 MG/0.5ML Pen Inject 2.5 mg into the skin once a week. 12/01/22     XULANE 150-35 MCG/24HR transdermal patch 1 patch once a week. 09/04/19 11/18/19  [provider]    Family History Family History  Problem Relation Age of Onset   Hypertension Mother    Alcohol abuse Mother    Liver  disease Mother    Other Father        unknown medical history   Hypertension Maternal Grandmother    Diabetes Maternal Grandmother    Dementia Maternal Grandmother    Autoimmune disease Daughter     Social History Social History   Tobacco Use   Smoking status: Never   Smokeless tobacco: Never  Vaping Use   Vaping Use: Never used  Substance Use Topics   Alcohol use: Not Currently    Alcohol/week: 1.0 standard drink of alcohol    Types: 1 Glasses of wine per week    Comment: occ wine   Drug use: No     Allergies   Vicodin [hydrocodone-acetaminophen]   Review of Systems Review of Systems  Constitutional:  Positive for fever. Negative for activity change, appetite change, chills, diaphoresis, fatigue and unexpected weight change.  HENT:  Positive for congestion, rhinorrhea and sore throat. Negative for dental problem, drooling, ear discharge, ear pain, facial swelling, hearing loss, mouth sores, nosebleeds, postnasal drip, sinus pressure, sinus pain, sneezing, tinnitus, trouble swallowing and voice change.   Respiratory:  Positive for cough. Negative for apnea, choking, chest tightness, shortness of breath, wheezing and stridor.   Cardiovascular: Negative.   Gastrointestinal:  Positive for diarrhea. Negative for abdominal distention, abdominal pain, anal bleeding, blood in stool, constipation, nausea, rectal pain and vomiting.  Neurological:  Positive for headaches. Negative for dizziness, tremors, seizures, syncope, facial asymmetry, speech difficulty, weakness, light-headedness and numbness.     Physical Exam Triage Vital Signs ED Triage Vitals  Enc Vitals Group     BP 12/16/22 0835 134/87     Pulse Rate 12/16/22 0835 93     Resp 12/16/22 0835 18     Temp 12/16/22 0835 98.4 F (36.9 C)     Temp Source 12/16/22 0835 Oral     SpO2 12/16/22 0835 100 %     Weight --      Height --      Head Circumference --      Peak Flow --      Pain Score 12/16/22 0833 8     Pain  Loc --      Pain Edu? --      Excl. in [place]? --    No data found.  Updated Vital Signs BP 134/87 (BP Location: Left Arm)   Pulse 93   Temp 98.4 F (36.9 C) (Oral)   Resp 18   LMP 10/27/2021 (Exact Date) Comment: having depot shots  SpO2 100%   Visual Acuity Right Eye Distance:   Left Eye Distance:   Bilateral Distance:    Right Eye Near:   Left Eye Near:    Bilateral Near:     Physical Exam Constitutional:  Appearance: Normal appearance.  HENT:     Head: Normocephalic.     Right Ear: Tympanic membrane, ear canal and external ear normal.     Left Ear: Tympanic membrane, ear canal and external ear normal.     Nose: Congestion and rhinorrhea present.     Mouth/Throat:     Mouth: Mucous membranes are moist.     Pharynx: No posterior oropharyngeal erythema.  Cardiovascular:     Rate and Rhythm: Normal rate and regular rhythm.     Pulses: Normal pulses.     Heart sounds: Normal heart sounds.  Pulmonary:     Effort: Pulmonary effort is normal.     Breath sounds: Normal breath sounds.  Skin:    General: Skin is warm and dry.  Neurological:     Mental Status: She is alert and oriented to person, place, and time. Mental status is at baseline.  Psychiatric:        Mood and Affect: Mood normal.        Behavior: Behavior normal.      UC Treatments / Results  Labs (all labs ordered are listed, but only abnormal results are displayed) Labs Reviewed  RESP PANEL BY RT-PCR (RSV, FLU A&B, COVID)  RVPGX2 - Abnormal; Notable for the following components:      Result Value   Influenza B by PCR POSITIVE (*)    All other components within normal limits  GROUP A STREP BY PCR    EKG   Radiology No results found.  Procedures Procedures (including critical care time)  Medications Ordered in UC Medications - No data to display  Initial Impression / Assessment and Plan / UC Course  I have reviewed the triage vital signs and the nursing notes.  Pertinent labs &  imaging results that were available during my care of the patient were reviewed by me and considered in my medical decision making (see chart for details).  Influenza B  Confirmed by PCR, vitals are stable patient is in no signs of distress nontoxic-appearing, lungs are clear and O2 saturation greater than 100, low suspicion for pneumonia or bronchitis therefore imaging deferred, prescribed Tamiflu, medically mouthwash and ibuprofen as sore throat is most worrisome symptom today, recommended continued use of additional supportive treatments, with urgent care follow-up as needed, work note given Final Clinical Impressions(s) / UC Diagnoses   Final diagnoses:  None   Discharge Instructions   None    ED Prescriptions   None    PDMP not reviewed this encounter.   Hans Eden, [place] 12/16/22 [phone]

## 2022-12-16 NOTE — Discharge Instructions (Signed)
Positive for influenza B, your symptoms are consistent with the current presentation, influenza is a virus and will steadily improve with time  You may take Tamiflu twice daily for the next 5 days, if you are able to get this medication from the pharmacy then continue supportive treatment, symptoms will improve as the virus works as well after system whether or not you take this medication  Gargle and spit Magic mouthwash solution every 4-6 hours as needed to provide a temporary relief to your throat    You can take Tylenol and/or Ibuprofen as needed for fever reduction and pain relief.   For cough: honey 1/2 to 1 teaspoon (you can dilute the honey in water or another fluid).  You can also use guaifenesin and dextromethorphan for cough. You can use a humidifier for chest congestion and cough.  If you don't have a humidifier, you can sit in the bathroom with the hot shower running.      For sore throat: try warm salt water gargles, cepacol lozenges, throat spray, warm tea or water with lemon/honey, popsicles or ice, or OTC cold relief medicine for throat discomfort.   For congestion: take a daily anti-histamine like Zyrtec, Claritin, and a oral decongestant, such as pseudoephedrine.  You can also use Flonase 1-2 sprays in each nostril daily.   It is important to stay hydrated: drink plenty of fluids (water, gatorade/powerade/pedialyte, juices, or teas) to keep your throat moisturized and help further relieve irritation/discomfort.

## 2022-12-16 NOTE — ED Triage Notes (Signed)
Pt presents with a cough, headache, fever, sore throat and bodyaches x 3 days.

## 2022-12-19 ENCOUNTER — Encounter: Payer: Self-pay | Admitting: Emergency Medicine

## 2022-12-19 ENCOUNTER — Ambulatory Visit
Admission: EM | Admit: 2022-12-19 | Discharge: 2022-12-19 | Disposition: A | Discharge status: Home / Self Care | Payer: 59 | Attending: Nurse Practitioner | Admitting: Nurse Practitioner

## 2022-12-19 DIAGNOSIS — H6993 Unspecified Eustachian tube disorder, bilateral: Secondary | ICD-10-CM | POA: Diagnosis not present

## 2022-12-19 DIAGNOSIS — R0981 Nasal congestion: Secondary | ICD-10-CM | POA: Diagnosis not present

## 2022-12-19 DIAGNOSIS — J101 Influenza due to other identified influenza virus with other respiratory manifestations: Secondary | ICD-10-CM | POA: Diagnosis not present

## 2022-12-19 MED ORDER — FLUTICASONE PROPIONATE 50 MCG/ACT NA SUSP: 1.0000 | Freq: Every day | NASAL | 1 refills | Status: DC

## 2022-12-19 MED ORDER — PREDNISONE 20 MG PO TABS: 40.0000 mg | ORAL_TABLET | Freq: Every day | ORAL | 0 refills | Status: AC

## 2022-12-19 MED ORDER — PROMETHAZINE-DM 6.25-15 MG/5ML PO SYRP: 5.0000 mL | ORAL_SOLUTION | Freq: Four times a day (QID) | ORAL | 0 refills | Status: DC | PRN

## 2022-12-19 NOTE — Discharge Instructions (Signed)
Flonase daily Promethazine DM as needed for cough.  Medication can make you drowsy.  Do not drink alcohol or drive on this medication daily for 4 days Rest and fluids Nasal rinses as tolerated Follow-up with your PCP in 2 to 3 days for recheck Please go to emergency room if you have any worsening symptoms

## 2022-12-19 NOTE — ED Triage Notes (Signed)
Patient states that on 12/16/22 she was diagnosed with the Flu.  Patient states since then she has han nasal congestion, sinus pain and pressure, HA.  Patient states last night she had left ear pain.

## 2022-12-19 NOTE — ED Provider Notes (Signed)
[facility]    CSN: [id number] Arrival date & time: 12/19/22  1114      History   Chief Complaint Chief Complaint  Patient presents with   Otalgia   Sinus Problem    HPI Genoa Freyre is a 42 y.o. female for evaluation of ear pain and congestion.  Patient was seen in urgent care on 1/30 where she was diagnosed with flu B and prescribed Tamiflu and Magic mouthwash.  She is been using over-the-counter Mucinex DM as well.  She reports persistent sinus pressure and congestion with bilateral ear pain and headache.  Denies any fevers, nausea/vomiting/diarrhea, shortness of breath.  No asthma or smoking history.  She is vaccinated for influenza.  No other concerns at this time.   Otalgia Associated symptoms: congestion   Sinus Problem    Past Medical History:  Diagnosis Date   Anemia    B12 deficiency    Gallstones 10/2022   History of gestational hypertension    Hypertension    Preterm labor    Uterine leiomyoma     Patient Active Problem List   Diagnosis Date Noted   NSVT (nonsustained ventricular tachycardia) (HCC)    Chronic headaches 02/06/2020   Recurrent syncope 02/06/2020   Occipital neuralgia 02/06/2020   Sinus tachycardia 02/06/2020   ANA positive 02/06/2020   Syncope 02/06/2020   Iron deficiency anemia 10/28/2019   Pregnancy 08/04/2018   Indication for care in labor or delivery 08/03/2018   Labor and delivery indication for care or intervention 08/02/2018   Chronic hypertension with superimposed pre-eclampsia 07/30/2018   Chronic hypertension with superimposed preeclampsia 07/21/2018   Short interval between pregnancies affecting pregnancy in first trimester, antepartum 03/18/2018   Advanced maternal age in multigravida, first trimester 03/18/2018   Fetal demise, greater than 22 weeks, antepartum, single gestation 11/13/2017    Past Surgical History:  Procedure Laterality Date   ROBOTIC ASSISTED LAPAROSCOPIC HYSTERECTOMY AND SALPINGECTOMY  Bilateral 12/20/2021   Procedure: XI ROBOTIC ASSISTED LAPAROSCOPIC HYSTERECTOMY AND SALPINGECTOMY;  Surgeon: Benjaman Kindler, MD;  Location: [facility] ORS;  Service: Gynecology;  Laterality: Bilateral;    OB History     Gravida  6   Para  6   Term  4   Preterm  1   AB      Living  5      SAB      IAB      Ectopic      Multiple  0   Live Births  1            Home Medications    Prior to Admission medications   Medication Sig Start Date End Date Taking? Authorizing Provider  fluticasone (FLONASE) 50 MCG/ACT nasal spray Place 1 spray into both nostrils daily. 12/19/22  Yes Melynda Ripple, NP  predniSONE (DELTASONE) 20 MG tablet Take 2 tablets (40 mg total) by mouth daily with breakfast for 5 days. 12/19/22 12/24/22 Yes Melynda Ripple, NP  promethazine-dextromethorphan (PROMETHAZINE-DM) 6.25-15 MG/5ML syrup Take 5 mLs by mouth 4 (four) times daily as needed for cough. 12/19/22  Yes Melynda Ripple, NP  loratadine (CLARITIN) 10 MG tablet Take 10 mg by mouth daily. 02/03/20 05/13/20  [provider]  Atogepant (QULIPTA PO) Take 1 tablet by mouth daily.    [provider]  cyclobenzaprine (FLEXERIL) 10 MG tablet Take 10 mg by mouth at bedtime as needed for muscle spasms.    [provider]  estradiol (ESTRACE) 0.1 MG/GM vaginal cream SMARTSIG:2  Gram(s) Vaginal Twice a Week    [provider]  ferrous sulfate 220 (44 Fe) MG/5ML solution Take 220 mg by mouth 2 (two) times daily with a meal.    [provider]  HYDROcodone-acetaminophen (NORCO) 5-325 MG tablet Take 1 tablet by mouth every 6 (six) hours as needed for up to 6 doses for moderate pain. 11/06/22   Lysle Pearl, Isami, DO  ibuprofen (ADVIL) 800 MG tablet Take 1 tablet (800 mg total) by mouth every 8 (eight) hours as needed for mild pain or moderate pain. 12/16/22   Hans Eden, NP  magic mouthwash (nystatin, lidocaine, diphenhydrAMINE, alum & mag hydroxide) suspension Swish and spit 5 mLs 4  (four) times daily as needed for mouth pain. 12/16/22   White, Leitha Schuller, NP  ondansetron (ZOFRAN) 4 MG tablet Take 1 tablet (4 mg total) by mouth daily as needed for nausea or vomiting. 11/08/22 11/08/23  Lucillie Garfinkel, MD  oseltamivir (TAMIFLU) 75 MG capsule Take 1 capsule (75 mg total) by mouth every 12 (twelve) hours. 12/16/22   White, Leitha Schuller, NP  tirzepatide (ZEPBOUND) 2.5 MG/0.5ML Pen Inject 2.5 mg into the skin once a week. 12/01/22     XULANE 150-35 MCG/24HR transdermal patch 1 patch once a week. 09/04/19 11/18/19  [provider]    Family History Family History  Problem Relation Age of Onset   Hypertension Mother    Alcohol abuse Mother    Liver disease Mother    Other Father        unknown medical history   Hypertension Maternal Grandmother    Diabetes Maternal Grandmother    Dementia Maternal Grandmother    Autoimmune disease Daughter     Social History Social History   Tobacco Use   Smoking status: Never   Smokeless tobacco: Never  Vaping Use   Vaping Use: Never used  Substance Use Topics   Alcohol use: Not Currently    Alcohol/week: 1.0 standard drink of alcohol    Types: 1 Glasses of wine per week    Comment: occ wine   Drug use: No     Allergies   Vicodin [hydrocodone-acetaminophen]   Review of Systems Review of Systems  HENT:  Positive for congestion, ear pain, sinus pressure and sinus pain.      Physical Exam Triage Vital Signs ED Triage Vitals  Enc Vitals Group     BP 12/19/22 1123 118/84     Pulse Rate 12/19/22 1123 (!) 102     Resp 12/19/22 1123 14     Temp 12/19/22 1123 98.8 F (37.1 C)     Temp Source 12/19/22 1123 Oral     SpO2 12/19/22 1123 95 %     Weight 12/19/22 1120 184 lb 1.4 oz (83.5 kg)     Height 12/19/22 1120 '5\' 1"'$  (1.549 m)     Head Circumference --      Peak Flow --      Pain Score 12/19/22 1120 8     Pain Loc --      Pain Edu? --      Excl. in [place]? --    No data found.  Updated Vital Signs BP 118/84 (BP  Location: Left Arm)   Pulse (!) 102   Temp 98.8 F (37.1 C) (Oral)   Resp 14   Ht '5\' 1"'$  (1.549 m)   Wt 184 lb 1.4 oz (83.5 kg)   LMP 10/27/2021 (Exact Date) Comment: having depot shots  SpO2 95%  BMI 34.78 kg/m   Visual Acuity Right Eye Distance:   Left Eye Distance:   Bilateral Distance:    Right Eye Near:   Left Eye Near:    Bilateral Near:     Physical Exam Vitals and nursing note reviewed.  Constitutional:      General: She is not in acute distress.    Appearance: She is well-developed. She is not ill-appearing.  HENT:     Head: Normocephalic and atraumatic.     Right Ear: Ear canal normal. A middle ear effusion is present. Tympanic membrane is not erythematous.     Left Ear: Ear canal normal. A middle ear effusion is present. Tympanic membrane is not erythematous.     Nose: Congestion present.     Right Turbinates: Swollen.     Left Turbinates: Swollen.     Right Sinus: Maxillary sinus tenderness present. No frontal sinus tenderness.     Left Sinus: Maxillary sinus tenderness present. No frontal sinus tenderness.     Mouth/Throat:     Mouth: Mucous membranes are moist.     Pharynx: Oropharynx is clear. Uvula midline. No oropharyngeal exudate or posterior oropharyngeal erythema.     Tonsils: No tonsillar exudate or tonsillar abscesses.  Eyes:     Conjunctiva/sclera: Conjunctivae normal.     Pupils: Pupils are equal, round, and reactive to light.  Cardiovascular:     Rate and Rhythm: Normal rate and regular rhythm.     Heart sounds: Normal heart sounds.  Pulmonary:     Effort: Pulmonary effort is normal.     Breath sounds: Normal breath sounds.  Musculoskeletal:     Cervical back: Normal range of motion and neck supple.  Lymphadenopathy:     Cervical: No cervical adenopathy.  Skin:    General: Skin is warm and dry.  Neurological:     General: No focal deficit present.     Mental Status: She is alert and oriented to person, place, and time.  Psychiatric:         Mood and Affect: Mood normal.        Behavior: Behavior normal.      UC Treatments / Results  Labs (all labs ordered are listed, but only abnormal results are displayed) Labs Reviewed - No data to display  EKG   Radiology No results found.  Procedures Procedures (including critical care time)  Medications Ordered in UC Medications - No data to display  Initial Impression / Assessment and Plan / UC Course  I have reviewed the triage vital signs and the nursing notes.  Pertinent labs & imaging results that were available during my care of the patient were reviewed by me and considered in my medical decision making (see chart for details).     Reviewed exam and symptoms with patient.  No red flags on exam. Discussed influenza symptoms and need for symptomatic treatment Flonase daily Nasal rinses as tolerated Promethazine DM for cough.  Side effect profile reviewed Prednisone x 5 days Rest and fluids PCP follow-up 2 to 3 days for recheck ER precautions reviewed and patient verbalized understanding Final Clinical Impressions(s) / UC Diagnoses   Final diagnoses:  Influenza B  Eustachian tube dysfunction, bilateral  Sinus congestion     Discharge Instructions      Flonase daily Promethazine DM as needed for cough.  Medication can make you drowsy.  Do not drink alcohol or drive on this medication daily for 4 days Rest and fluids Nasal rinses as tolerated  Follow-up with your PCP in 2 to 3 days for recheck Please go to emergency room if you have any worsening symptoms   ED Prescriptions     Medication Sig Dispense Auth. Provider   fluticasone (FLONASE) 50 MCG/ACT nasal spray Place 1 spray into both nostrils daily. 15.8 mL Melynda Ripple, NP   promethazine-dextromethorphan (PROMETHAZINE-DM) 6.25-15 MG/5ML syrup Take 5 mLs by mouth 4 (four) times daily as needed for cough. 118 mL Melynda Ripple, NP   predniSONE (DELTASONE) 20 MG tablet Take 2 tablets (40 mg  total) by mouth daily with breakfast for 5 days. 10 tablet Melynda Ripple, NP      PDMP not reviewed this encounter.   Melynda Ripple, NP 12/19/22 1139

## 2022-12-24 DIAGNOSIS — R200 Anesthesia of skin: Secondary | ICD-10-CM | POA: Diagnosis not present

## 2022-12-24 DIAGNOSIS — M5481 Occipital neuralgia: Secondary | ICD-10-CM | POA: Diagnosis not present

## 2022-12-24 DIAGNOSIS — H02401 Unspecified ptosis of right eyelid: Secondary | ICD-10-CM | POA: Diagnosis not present

## 2022-12-24 DIAGNOSIS — G43109 Migraine with aura, not intractable, without status migrainosus: Secondary | ICD-10-CM | POA: Diagnosis not present

## 2022-12-28 ENCOUNTER — Other Ambulatory Visit: Payer: Self-pay

## 2022-12-29 ENCOUNTER — Other Ambulatory Visit: Payer: Self-pay | Admitting: Neurology

## 2022-12-29 DIAGNOSIS — H02401 Unspecified ptosis of right eyelid: Secondary | ICD-10-CM

## 2023-01-06 ENCOUNTER — Ambulatory Visit: Payer: 59

## 2023-01-07 ENCOUNTER — Ambulatory Visit
Admission: RE | Admit: 2023-01-07 | Discharge: 2023-01-07 | Disposition: A | Discharge status: Home / Self Care | Payer: 59 | Source: Ambulatory Visit | Attending: Neurology | Admitting: Neurology

## 2023-01-07 DIAGNOSIS — H02401 Unspecified ptosis of right eyelid: Secondary | ICD-10-CM

## 2023-01-07 DIAGNOSIS — G43909 Migraine, unspecified, not intractable, without status migrainosus: Secondary | ICD-10-CM | POA: Diagnosis not present

## 2023-01-08 DIAGNOSIS — G5603 Carpal tunnel syndrome, bilateral upper limbs: Secondary | ICD-10-CM | POA: Diagnosis not present

## 2023-01-12 ENCOUNTER — Emergency Department
Admission: EM | Admit: 2023-01-12 | Discharge: 2023-01-12 | Disposition: A | Discharge status: Home / Self Care | Payer: 59 | Attending: Emergency Medicine | Admitting: Emergency Medicine

## 2023-01-12 ENCOUNTER — Encounter: Payer: Self-pay | Admitting: Emergency Medicine

## 2023-01-12 ENCOUNTER — Other Ambulatory Visit: Payer: Self-pay

## 2023-01-12 DIAGNOSIS — R030 Elevated blood-pressure reading, without diagnosis of hypertension: Secondary | ICD-10-CM | POA: Diagnosis not present

## 2023-01-12 DIAGNOSIS — H02401 Unspecified ptosis of right eyelid: Secondary | ICD-10-CM | POA: Diagnosis not present

## 2023-01-12 DIAGNOSIS — G5603 Carpal tunnel syndrome, bilateral upper limbs: Secondary | ICD-10-CM | POA: Diagnosis not present

## 2023-01-12 DIAGNOSIS — R519 Headache, unspecified: Secondary | ICD-10-CM | POA: Diagnosis present

## 2023-01-12 DIAGNOSIS — G43809 Other migraine, not intractable, without status migrainosus: Secondary | ICD-10-CM | POA: Diagnosis not present

## 2023-01-12 DIAGNOSIS — I10 Essential (primary) hypertension: Secondary | ICD-10-CM | POA: Diagnosis not present

## 2023-01-12 DIAGNOSIS — M5481 Occipital neuralgia: Secondary | ICD-10-CM | POA: Diagnosis not present

## 2023-01-12 DIAGNOSIS — G43109 Migraine with aura, not intractable, without status migrainosus: Secondary | ICD-10-CM | POA: Diagnosis not present

## 2023-01-12 LAB — CBC
HCT: 31.9 % — ABNORMAL LOW (ref 36.0–46.0)
Hemoglobin: 9.7 g/dL — ABNORMAL LOW (ref 12.0–15.0)
MCH: 24.6 pg — ABNORMAL LOW (ref 26.0–34.0)
MCHC: 30.4 g/dL (ref 30.0–36.0)
MCV: 81 fL (ref 80.0–100.0)
Platelets: 289 10*3/uL (ref 150–400)
RBC: 3.94 MIL/uL (ref 3.87–5.11)
RDW: 16.6 % — ABNORMAL HIGH (ref 11.5–15.5)
WBC: 4.3 10*3/uL (ref 4.0–10.5)
nRBC: 0 % (ref 0.0–0.2)

## 2023-01-12 LAB — URINALYSIS, ROUTINE W REFLEX MICROSCOPIC
Bilirubin Urine: NEGATIVE
Glucose, UA: NEGATIVE mg/dL
Hgb urine dipstick: NEGATIVE
Ketones, ur: NEGATIVE mg/dL
Leukocytes,Ua: NEGATIVE
Nitrite: NEGATIVE
Protein, ur: NEGATIVE mg/dL
Specific Gravity, Urine: 1.002 — ABNORMAL LOW (ref 1.005–1.030)
pH: 6 (ref 5.0–8.0)

## 2023-01-12 LAB — BASIC METABOLIC PANEL
Anion gap: 7 (ref 5–15)
BUN: 10 mg/dL (ref 6–20)
CO2: 25 mmol/L (ref 22–32)
Calcium: 8.7 mg/dL — ABNORMAL LOW (ref 8.9–10.3)
Chloride: 105 mmol/L (ref 98–111)
Creatinine, Ser: 0.74 mg/dL (ref 0.44–1.00)
GFR, Estimated: >60 mL/min (ref >60)
Glucose, Bld: 117 mg/dL — ABNORMAL HIGH (ref 70–99)
Potassium: 3.3 mmol/L — ABNORMAL LOW (ref 3.5–5.1)
Sodium: 137 mmol/L (ref 135–145)

## 2023-01-12 MED ORDER — SODIUM CHLORIDE 0.9 % IV BOLUS
500.0000 mL | Freq: Once | INTRAVENOUS | Status: AC
  Administered 2023-01-12: 500 mL via INTRAVENOUS

## 2023-01-12 MED ORDER — METOCLOPRAMIDE HCL 5 MG/ML IJ SOLN
20.0000 mg | Freq: Once | INTRAVENOUS | Status: AC
  Administered 2023-01-12: 20 mg via INTRAVENOUS
  Filled 2023-01-12: qty 4

## 2023-01-12 MED ORDER — DIPHENHYDRAMINE HCL 50 MG/ML IJ SOLN
25.0000 mg | Freq: Once | INTRAMUSCULAR | Status: AC
  Administered 2023-01-12: 25 mg via INTRAVENOUS
  Filled 2023-01-12: qty 1

## 2023-01-12 MED ORDER — KETOROLAC TROMETHAMINE 30 MG/ML IJ SOLN
30.0000 mg | Freq: Once | INTRAMUSCULAR | Status: AC
  Administered 2023-01-12: 30 mg via INTRAVENOUS
  Filled 2023-01-12: qty 1

## 2023-01-12 NOTE — ED Provider Notes (Signed)
[facility] Provider Note    Event Date/Time   First MD Initiated Contact with Patient 01/12/23 [phone]     (approximate)   History   Headache   HPI  Cindy Hays is a 42 y.o. female with a history of migraine headaches presents today with light sensitivity and headache.  She reports it is somewhat similar to prior migraines.  She reports she has been working with neurology to control, had MR angio last week which was normal per review of medical records.  Complains of some intermittent tingling in her right arm over the last several days which she thinks may be related to having an injection for carpal tunnel on Friday.  No motor weakness.     Physical Exam   Triage Vital Signs: ED Triage Vitals  Enc Vitals Group     BP 01/12/23 0840 (!) 140/81     Pulse Rate 01/12/23 0840 80     Resp 01/12/23 0840 18     Temp 01/12/23 0840 98.4 F (36.9 C)     Temp Source 01/12/23 0840 Oral     SpO2 01/12/23 0840 100 %     Weight 01/12/23 0857 84 kg (185 lb 3 oz)     Height 01/12/23 0857 1.549 m ('5\' 1"'$ )     Head Circumference --      Peak Flow --      Pain Score 01/12/23 0840 10     Pain Loc --      Pain Edu? --      Excl. in [place]? --     Most recent vital signs: Vitals:   01/12/23 0840  BP: (!) 140/81  Pulse: 80  Resp: 18  Temp: 98.4 F (36.9 C)  SpO2: 100%     General: Awake, no distress.  CV:  Good peripheral perfusion.  Resp:  Normal effort.  Abd:  No distention.  Other:  Cranial nerves II through XII are normal, normal strength in all extremities   ED Results / Procedures / Treatments   Labs (all labs ordered are listed, but only abnormal results are displayed) Labs Reviewed  BASIC METABOLIC PANEL - Abnormal; Notable for the following components:      Result Value   Potassium 3.3 (*)    Glucose, Bld 117 (*)    Calcium 8.7 (*)    All other components within normal limits  CBC - Abnormal; Notable for the following components:    Hemoglobin 9.7 (*)    HCT 31.9 (*)    MCH 24.6 (*)    RDW 16.6 (*)    All other components within normal limits  URINALYSIS, ROUTINE W REFLEX MICROSCOPIC - Abnormal; Notable for the following components:   Color, Urine COLORLESS (*)    APPearance CLEAR (*)    Specific Gravity, Urine 1.002 (*)    All other components within normal limits  CBG MONITORING, ED     EKG  ED ECG REPORT I, Lavonia Drafts, the attending physician, personally viewed and interpreted this ECG.  Date: 01/12/2023  Rhythm: normal sinus rhythm QRS Axis: normal Intervals: normal ST/T Wave abnormalities: normal Narrative Interpretation: no evidence of acute ischemia    RADIOLOGY     PROCEDURES:  Critical Care performed:   Procedures   MEDICATIONS ORDERED IN ED: Medications  diphenhydrAMINE (BENADRYL) injection 25 mg (25 mg Intravenous Given 01/12/23 0931)  metoCLOPramide (REGLAN) 20 mg in dextrose 5 % 50 mL IVPB (0 mg Intravenous Stopped 01/12/23 1048)  ketorolac (TORADOL)  30 MG/ML injection 30 mg (30 mg Intravenous Given 01/12/23 0933)  sodium chloride 0.9 % bolus 500 mL (0 mLs Intravenous Stopped 01/12/23 1015)     IMPRESSION / MDM / ASSESSMENT AND PLAN / ED COURSE  I reviewed the triage vital signs and the nursing notes. Patient's presentation is most consistent with exacerbation of chronic illness.   Patient presents with headache as noted above, most consistent with migraine headache.  Reassuring MRI performed last week, no indication for additional imaging at this time.  Patient is afebrile.  She is concerned about her blood pressure but is only mildly elevated, reassurance provided.  Lab work reviewed and is overall reassuring, will treat with IV Benadryl, IV Toradol, IV Reglan, IV fluids and reevaluate   After treatment patient feeling much better headache is essentially resolved, appropriate for discharge at this time      FINAL CLINICAL IMPRESSION(S) / ED DIAGNOSES   Final  diagnoses:  Other migraine without status migrainosus, not intractable     Rx / DC Orders   ED Discharge Orders     None        Note:  This document was prepared using Dragon voice recognition software and may include unintentional dictation errors.   Lavonia Drafts, MD 01/12/23 [phone]

## 2023-01-12 NOTE — ED Notes (Signed)
60 yof with a c/c headache and pressure to the back of her head. The pt advised she does have a hx of migraines. The pt was alert and oriented x 4. The pt is warm, pink, and dry.

## 2023-01-12 NOTE — ED Triage Notes (Signed)
Patient to ED for hypertension. Patient states she is having headache and blurred vision x2 days. Also, states numbness in right arm since Friday after getting carpal tunnel injection.

## 2023-01-13 ENCOUNTER — Other Ambulatory Visit: Payer: Self-pay

## 2023-01-13 MED ORDER — ESTRADIOL 0.1 MG/GM VA CREA
2.0000 g | TOPICAL_CREAM | VAGINAL | 1 refills | Status: AC
  Filled 2023-02-05: qty 42.5, 74d supply, fill #0
  Filled 2023-04-20: qty 42.5, 73d supply, fill #0

## 2023-01-13 MED ORDER — FLUTICASONE PROPIONATE 50 MCG/ACT NA SUSP: 1.0000 | Freq: Every day | NASAL | 1 refills | Status: DC

## 2023-01-13 MED ORDER — AIMOVIG 140 MG/ML ~~LOC~~ SOAJ
140.0000 mg | SUBCUTANEOUS | 6 refills | Status: AC
  Filled 2023-01-13 – 2023-01-16 (×4): qty 1, 28d supply, fill #0
  Filled 2023-01-29 – 2023-02-11 (×2): qty 1, 28d supply, fill #1
  Filled 2023-03-11 – 2023-03-16 (×2): qty 1, 28d supply, fill #2
  Filled 2023-04-09: qty 1, 28d supply, fill #3
  Filled 2023-04-11 (×4): qty 1, 28d supply, fill #4
  Filled 2023-05-07: qty 1, 28d supply, fill #5
  Filled 2023-06-16: qty 1, 28d supply, fill #6

## 2023-01-14 ENCOUNTER — Other Ambulatory Visit: Payer: Self-pay

## 2023-01-15 ENCOUNTER — Other Ambulatory Visit: Payer: Self-pay

## 2023-01-16 ENCOUNTER — Other Ambulatory Visit: Payer: Self-pay

## 2023-01-19 ENCOUNTER — Other Ambulatory Visit: Payer: Self-pay

## 2023-01-20 ENCOUNTER — Other Ambulatory Visit: Payer: Self-pay

## 2023-01-21 ENCOUNTER — Other Ambulatory Visit: Payer: Self-pay

## 2023-01-21 MED ORDER — FLUCONAZOLE 150 MG PO TABS
150.0000 mg | ORAL_TABLET | ORAL | 0 refills | Status: AC
  Filled 2023-01-21: qty 2, 3d supply, fill #0

## 2023-01-21 MED ORDER — HYDROCORTISONE 2.5 % EX OINT
1.0000 | TOPICAL_OINTMENT | Freq: Two times a day (BID) | CUTANEOUS | 0 refills | Status: AC
  Filled 2023-01-21: qty 28.35, 30d supply, fill #0

## 2023-01-21 MED ORDER — HYDROCORTISONE 2.5 % EX LOTN
1.0000 | TOPICAL_LOTION | Freq: Two times a day (BID) | CUTANEOUS | 0 refills | Status: DC
  Filled 2023-01-21 (×2): qty 59, 30d supply, fill #0

## 2023-01-29 ENCOUNTER — Other Ambulatory Visit (HOSPITAL_BASED_OUTPATIENT_CLINIC_OR_DEPARTMENT_OTHER): Payer: Self-pay

## 2023-01-29 ENCOUNTER — Other Ambulatory Visit: Payer: Self-pay

## 2023-02-05 ENCOUNTER — Other Ambulatory Visit: Payer: Self-pay

## 2023-02-11 ENCOUNTER — Other Ambulatory Visit (HOSPITAL_COMMUNITY): Payer: Self-pay

## 2023-02-11 ENCOUNTER — Other Ambulatory Visit: Payer: Self-pay

## 2023-02-12 ENCOUNTER — Other Ambulatory Visit: Payer: Self-pay

## 2023-02-19 ENCOUNTER — Other Ambulatory Visit: Payer: Self-pay

## 2023-02-19 DIAGNOSIS — G43839 Menstrual migraine, intractable, without status migrainosus: Secondary | ICD-10-CM | POA: Diagnosis not present

## 2023-02-19 DIAGNOSIS — Z0000 Encounter for general adult medical examination without abnormal findings: Secondary | ICD-10-CM | POA: Diagnosis not present

## 2023-02-19 DIAGNOSIS — G44021 Chronic cluster headache, intractable: Secondary | ICD-10-CM | POA: Diagnosis not present

## 2023-02-19 DIAGNOSIS — K5900 Constipation, unspecified: Secondary | ICD-10-CM | POA: Diagnosis not present

## 2023-02-19 DIAGNOSIS — I10 Essential (primary) hypertension: Secondary | ICD-10-CM | POA: Diagnosis not present

## 2023-02-19 MED ORDER — BUTALBITAL-APAP-CAFFEINE 50-325-40 MG PO TABS
2.0000 | ORAL_TABLET | ORAL | 0 refills | Status: AC | PRN
  Filled 2023-02-19: qty 30, 10d supply, fill #0

## 2023-02-19 MED ORDER — LINZESS 72 MCG PO CAPS
72.0000 ug | ORAL_CAPSULE | Freq: Every day | ORAL | 1 refills | Status: DC
  Filled 2023-02-19 – 2023-04-20 (×2): qty 30, 30d supply, fill #0

## 2023-02-24 ENCOUNTER — Other Ambulatory Visit: Payer: Self-pay

## 2023-02-26 DIAGNOSIS — G43109 Migraine with aura, not intractable, without status migrainosus: Secondary | ICD-10-CM | POA: Diagnosis not present

## 2023-02-26 DIAGNOSIS — R0683 Snoring: Secondary | ICD-10-CM | POA: Diagnosis not present

## 2023-02-26 DIAGNOSIS — G5603 Carpal tunnel syndrome, bilateral upper limbs: Secondary | ICD-10-CM | POA: Diagnosis not present

## 2023-02-26 DIAGNOSIS — H02401 Unspecified ptosis of right eyelid: Secondary | ICD-10-CM | POA: Diagnosis not present

## 2023-03-06 ENCOUNTER — Other Ambulatory Visit: Payer: Self-pay

## 2023-03-09 ENCOUNTER — Other Ambulatory Visit: Payer: Self-pay

## 2023-03-09 MED ORDER — NURTEC 75 MG PO TBDP
75.0000 mg | ORAL_TABLET | Freq: Every day | ORAL | 3 refills | Status: DC
  Filled 2023-03-09: qty 8, 16d supply, fill #0
  Filled 2023-03-20: qty 8, 30d supply, fill #1
  Filled 2023-03-20: qty 16, 30d supply, fill #1
  Filled 2023-03-21 (×2): qty 8, 16d supply, fill #1
  Filled 2023-03-23 (×2): qty 8, 16d supply, fill #0
  Filled 2023-03-23 – 2023-04-20 (×2): qty 8, 16d supply, fill #1
  Filled 2023-05-29: qty 8, 16d supply, fill #2
  Filled 2023-07-07: qty 8, 16d supply, fill #3
  Filled 2023-08-07 – 2023-08-10 (×2): qty 8, 16d supply, fill #4
  Filled 2023-09-03: qty 8, 16d supply, fill #5
  Filled 2023-10-08: qty 8, 16d supply, fill #6

## 2023-03-11 ENCOUNTER — Emergency Department
Admission: EM | Admit: 2023-03-11 | Discharge: 2023-03-11 | Disposition: A | Discharge status: Home / Self Care | Payer: 59 | Attending: Emergency Medicine | Admitting: Emergency Medicine

## 2023-03-11 ENCOUNTER — Other Ambulatory Visit: Payer: Self-pay

## 2023-03-11 DIAGNOSIS — G43909 Migraine, unspecified, not intractable, without status migrainosus: Secondary | ICD-10-CM | POA: Diagnosis not present

## 2023-03-11 DIAGNOSIS — G43109 Migraine with aura, not intractable, without status migrainosus: Secondary | ICD-10-CM | POA: Diagnosis not present

## 2023-03-11 MED ORDER — SODIUM CHLORIDE 0.9 % IV BOLUS
1000.0000 mL | Freq: Once | INTRAVENOUS | Status: AC
  Administered 2023-03-11: 1000 mL via INTRAVENOUS

## 2023-03-11 MED ORDER — DIHYDROERGOTAMINE MESYLATE 4 MG/ML NA SOLN
NASAL | 6 refills | Status: DC
  Filled 2023-03-11: qty 8, 30d supply, fill #0

## 2023-03-11 MED ORDER — METOCLOPRAMIDE HCL 5 MG/ML IJ SOLN
10.0000 mg | Freq: Once | INTRAMUSCULAR | Status: AC
  Administered 2023-03-11: 10 mg via INTRAVENOUS
  Filled 2023-03-11: qty 2

## 2023-03-11 MED ORDER — DIPHENHYDRAMINE HCL 50 MG/ML IJ SOLN
25.0000 mg | Freq: Once | INTRAMUSCULAR | Status: AC
  Administered 2023-03-11: 25 mg via INTRAVENOUS
  Filled 2023-03-11: qty 1

## 2023-03-11 MED ORDER — DEXAMETHASONE SODIUM PHOSPHATE 10 MG/ML IJ SOLN
10.0000 mg | Freq: Once | INTRAMUSCULAR | Status: AC
  Administered 2023-03-11: 10 mg via INTRAVENOUS
  Filled 2023-03-11: qty 1

## 2023-03-11 NOTE — ED Triage Notes (Addendum)
Pt to ED for headache to back of head since 0300 today. Also reports tingling in bilateral arms and hands.  Reports taking new meds for migraines. +emesis Ambulatory to triage, NAD noted

## 2023-03-11 NOTE — ED Provider Notes (Signed)
[facility] Provider Note    Event Date/Time   First MD Initiated Contact with Patient 03/11/23 0719     (approximate)   History   Headache   HPI  Cindy Hays is a 42 y.o. female   presents to the ED with complaint of migraine headache that started approximately 3 AM today.  Patient has a history of migraines and is currently being seen by neurology at [facility].  Patient has taken her medication for migraines with out complete relief.  In reviewing her history she has had an MR angio on 01/07/2023 which was negative.  Patient has been seen in the ED several times and states that the medication that she gets in her IV usually helps with her headaches.  She also is trying a new medication for headaches prescribed by Dr. Sherryll Burger.      Physical Exam   Triage Vital Signs: ED Triage Vitals  Enc Vitals Group     BP 03/11/23 0715 (!) 138/92     Pulse Rate 03/11/23 0715 89     Resp 03/11/23 0715 16     Temp 03/11/23 0715 98.5 F (36.9 C)     Temp src --      SpO2 03/11/23 0715 100 %     Weight 03/11/23 0716 189 lb (85.7 kg)     Height 03/11/23 0716 5' (1.524 m)     Head Circumference --      Peak Flow --      Pain Score 03/11/23 0716 10     Pain Loc --      Pain Edu? --      Excl. in [place]? --     Most recent vital signs: Vitals:   03/11/23 0715  BP: (!) 138/92  Pulse: 89  Resp: 16  Temp: 98.5 F (36.9 C)  SpO2: 100%     General: Awake, no distress.  Alert, talkative, oriented and answering questions appropriately.  Photosensitivity. CV:  Good peripheral perfusion.  Heart regular rate and rhythm. Resp:  Normal effort.  Lungs are clear bilaterally. Abd:  No distention.  Other:  PERRLA, EOMI's, no tenderness on palpation of the cervical spine posteriorly however posterior scalp mildly tender on palpation.  Cranial nerves II through XII grossly intact.  Speech is normal.  Patient is ambulatory without any assistance.   ED Results /  Procedures / Treatments   Labs (all labs ordered are listed, but only abnormal results are displayed) Labs Reviewed - No data to display    PROCEDURES:  Critical Care performed:   Procedures   MEDICATIONS ORDERED IN ED: Medications  sodium chloride 0.9 % bolus 1,000 mL (0 mLs Intravenous Stopped 03/11/23 0938)  diphenhydrAMINE (BENADRYL) injection 25 mg (25 mg Intravenous Given 03/11/23 0800)  dexamethasone (DECADRON) injection 10 mg (10 mg Intravenous Given 03/11/23 0800)  metoCLOPramide (REGLAN) injection 10 mg (10 mg Intravenous Given 03/11/23 0800)     IMPRESSION / MDM / ASSESSMENT AND PLAN / ED COURSE  I reviewed the triage vital signs and the nursing notes.   Differential diagnosis includes, but is not limited to, migraine headache, muscle contraction headache, complicated migraine.  42 year old female presents to the ED with complaint of migraine headache unrelieved by her migraine medication that was prescribed by her neurologist.  Patient had an MR angio of her head February 2024 which was reported as negative for any acute intracranial changes.  She is followed by Dr. Sherryll Burger who is the neurologist over [facility].  While she was here in the emergency department she called and a new medication is being sent to the pharmacy for her to begin using after leaving the ED.  She was given IV fluids, Benadryl, Decadron, Reglan and began getting relief of her headache prior to discharge.  Patient was ambulatory in the hallway without any assistance.  Family member is here to drive patient home.  She currently is waiting to be scheduled for some injections per neurology to see if this will help control some of her migraine headaches.      Patient's presentation is most consistent with acute complicated illness / injury requiring diagnostic workup.  FINAL CLINICAL IMPRESSION(S) / ED DIAGNOSES   Final diagnoses:  Complicated migraine     Rx / DC Orders   ED Discharge Orders      None        Note:  This document was prepared using Dragon voice recognition software and may include unintentional dictation errors.   Tommi Rumps, PA-C 03/11/23 1202    Georga Hacking, MD 03/11/23 [phone]

## 2023-03-11 NOTE — ED Notes (Signed)
Cindy Hays, emtp attempted iv inserrtion x3 without success.

## 2023-03-11 NOTE — Discharge Instructions (Signed)
Continue to follow-up with Dr. Sherryll Burger that you are seeing for your migraines and use the medication that he called in this morning.

## 2023-03-16 ENCOUNTER — Other Ambulatory Visit: Payer: Self-pay

## 2023-03-16 DIAGNOSIS — H5213 Myopia, bilateral: Secondary | ICD-10-CM | POA: Diagnosis not present

## 2023-03-16 DIAGNOSIS — G43109 Migraine with aura, not intractable, without status migrainosus: Secondary | ICD-10-CM | POA: Diagnosis not present

## 2023-03-17 ENCOUNTER — Emergency Department
Admission: EM | Admit: 2023-03-17 | Discharge: 2023-03-17 | Disposition: A | Discharge status: Home / Self Care | Payer: 59 | Attending: Emergency Medicine | Admitting: Emergency Medicine

## 2023-03-17 ENCOUNTER — Emergency Department: Payer: 59

## 2023-03-17 ENCOUNTER — Other Ambulatory Visit: Payer: Self-pay

## 2023-03-17 DIAGNOSIS — R55 Syncope and collapse: Secondary | ICD-10-CM

## 2023-03-17 DIAGNOSIS — R519 Headache, unspecified: Secondary | ICD-10-CM | POA: Diagnosis not present

## 2023-03-17 LAB — BASIC METABOLIC PANEL
Anion gap: 7 (ref 5–15)
BUN: 8 mg/dL (ref 6–20)
CO2: 24 mmol/L (ref 22–32)
Calcium: 9.1 mg/dL (ref 8.9–10.3)
Chloride: 103 mmol/L (ref 98–111)
Creatinine, Ser: 0.61 mg/dL (ref 0.44–1.00)
GFR, Estimated: >60 mL/min (ref >60)
Glucose, Bld: 130 mg/dL — ABNORMAL HIGH (ref 70–99)
Potassium: 3.5 mmol/L (ref 3.5–5.1)
Sodium: 134 mmol/L — ABNORMAL LOW (ref 135–145)

## 2023-03-17 LAB — CBC
HCT: 32.9 % — ABNORMAL LOW (ref 36.0–46.0)
Hemoglobin: 10 g/dL — ABNORMAL LOW (ref 12.0–15.0)
MCH: 24.8 pg — ABNORMAL LOW (ref 26.0–34.0)
MCHC: 30.4 g/dL (ref 30.0–36.0)
MCV: 81.4 fL (ref 80.0–100.0)
Platelets: 292 10*3/uL (ref 150–400)
RBC: 4.04 MIL/uL (ref 3.87–5.11)
RDW: 15.9 % — ABNORMAL HIGH (ref 11.5–15.5)
WBC: 4.6 10*3/uL (ref 4.0–10.5)
nRBC: 0 % (ref 0.0–0.2)

## 2023-03-17 NOTE — ED Provider Notes (Signed)
[facility] Provider Note    Event Date/Time   First MD Initiated Contact with Patient 03/17/23 1149     (approximate)   History   Loss of Consciousness   HPI  Cindy Hays is a 42 y.o. female presents emergency department after syncopal episode at [employer] [facility].  Patient was getting an injection in her occipital nerve when she started to feel hot and then fell having a syncopal episode.  Patient continues to have slight headache.  No new numbness or tingling.  No slurred speech.      Physical Exam   Triage Vital Signs: ED Triage Vitals  Enc Vitals Group     BP 03/17/23 0955 119/80     Pulse Rate 03/17/23 0955 79     Resp 03/17/23 0955 16     Temp 03/17/23 0955 98.4 F (36.9 C)     Temp src --      SpO2 03/17/23 0955 100 %     Weight 03/17/23 0956 189 lb (85.7 kg)     Height 03/17/23 0956 5\' 1"  (1.549 m)     Head Circumference --      Peak Flow --      Pain Score 03/17/23 0956 7     Pain Loc --      Pain Edu? --      Excl. in [place]? --     Most recent vital signs: Vitals:   03/17/23 0955  BP: 119/80  Pulse: 79  Resp: 16  Temp: 98.4 F (36.9 C)  SpO2: 100%     General: Awake, no distress.   CV:  Good peripheral perfusion. regular rate and  rhythm Resp:  Normal effort.  Abd:  No distention.   Other:  PERRL, EOMI, radial nerves II through XII grossly intact, skull is tender to palpation   ED Results / Procedures / Treatments   Labs (all labs ordered are listed, but only abnormal results are displayed) Labs Reviewed  BASIC METABOLIC PANEL - Abnormal; Notable for the following components:      Result Value   Sodium 134 (*)    Glucose, Bld 130 (*)    All other components within normal limits  CBC - Abnormal; Notable for the following components:   Hemoglobin 10.0 (*)    HCT 32.9 (*)    MCH 24.8 (*)    RDW 15.9 (*)    All other components within normal limits  URINALYSIS, ROUTINE W REFLEX MICROSCOPIC  CBG MONITORING,  ED     EKG     RADIOLOGY CT of the head    PROCEDURES:   Procedures   MEDICATIONS ORDERED IN ED: Medications - No data to display   IMPRESSION / MDM / ASSESSMENT AND PLAN / ED COURSE  I reviewed the triage vital signs and the nursing notes.                              Differential diagnosis includes, but is not limited to, subdural, SAH, syncope, vagal reaction  Patient's presentation is most consistent with acute presentation with potential threat to life or bodily function.   Due to the patient's fall and recent injection we will go ahead and do CT of the head  She does appear to be very stable.  Anticipate discharge.   CT of the head was independently reviewed and interpreted by me as being negative for any acute abnormality, confirmed by radiology  I did explain the findings to the patient.  She is feeling better.  I do not feel that the patient needs admission.  She is in agreement with the treatment plan at this time.  Follow-up with your regular doctor.  Apply ice to the areas that were injected earlier today.  Return emergency department worsening.  She was discharged stable condition.   FINAL CLINICAL IMPRESSION(S) / ED DIAGNOSES   Final diagnoses:  Vasovagal syncope     Rx / DC Orders   ED Discharge Orders     None        Note:  This document was prepared using Dragon voice recognition software and may include unintentional dictation errors.    Faythe Ghee, PA-C 03/17/23 1659    Phineas Semen, MD 03/20/23 [phone]

## 2023-03-17 NOTE — ED Triage Notes (Signed)
Pt states she was at [facility] getting a nerve block when she passed out. Pt states she has never passed out before. Pt states head pain 7/10.0

## 2023-03-17 NOTE — Discharge Instructions (Signed)
Follow-up with your regular doctor if not improving in 1 to 2 days.  Return if worsening.  Drink plenty of water.

## 2023-03-17 NOTE — ED Notes (Signed)
Patient transported to CT 

## 2023-03-20 ENCOUNTER — Other Ambulatory Visit: Payer: Self-pay

## 2023-03-20 ENCOUNTER — Other Ambulatory Visit (HOSPITAL_BASED_OUTPATIENT_CLINIC_OR_DEPARTMENT_OTHER): Payer: Self-pay

## 2023-03-21 ENCOUNTER — Other Ambulatory Visit (HOSPITAL_BASED_OUTPATIENT_CLINIC_OR_DEPARTMENT_OTHER): Payer: Self-pay

## 2023-03-23 ENCOUNTER — Other Ambulatory Visit: Payer: Self-pay

## 2023-03-23 ENCOUNTER — Other Ambulatory Visit (HOSPITAL_BASED_OUTPATIENT_CLINIC_OR_DEPARTMENT_OTHER): Payer: Self-pay

## 2023-03-26 ENCOUNTER — Other Ambulatory Visit: Payer: Self-pay

## 2023-03-31 DIAGNOSIS — M5481 Occipital neuralgia: Secondary | ICD-10-CM | POA: Diagnosis not present

## 2023-04-09 ENCOUNTER — Other Ambulatory Visit: Payer: Self-pay

## 2023-04-11 ENCOUNTER — Other Ambulatory Visit (HOSPITAL_COMMUNITY): Payer: Self-pay

## 2023-04-14 ENCOUNTER — Other Ambulatory Visit: Payer: Self-pay

## 2023-04-15 ENCOUNTER — Other Ambulatory Visit: Payer: Self-pay

## 2023-04-15 ENCOUNTER — Emergency Department
Admission: EM | Admit: 2023-04-15 | Discharge: 2023-04-16 | Disposition: A | Discharge status: Home / Self Care | Payer: 59 | Attending: Emergency Medicine | Admitting: Emergency Medicine

## 2023-04-15 ENCOUNTER — Emergency Department: Payer: 59

## 2023-04-15 DIAGNOSIS — B349 Viral infection, unspecified: Secondary | ICD-10-CM | POA: Insufficient documentation

## 2023-04-15 DIAGNOSIS — E860 Dehydration: Secondary | ICD-10-CM | POA: Diagnosis not present

## 2023-04-15 DIAGNOSIS — R112 Nausea with vomiting, unspecified: Secondary | ICD-10-CM | POA: Diagnosis present

## 2023-04-15 DIAGNOSIS — Z1152 Encounter for screening for COVID-19: Secondary | ICD-10-CM | POA: Diagnosis not present

## 2023-04-15 DIAGNOSIS — R0602 Shortness of breath: Secondary | ICD-10-CM | POA: Diagnosis not present

## 2023-04-15 LAB — CBC WITH DIFFERENTIAL/PLATELET
Abs Immature Granulocytes: 0.02 10*3/uL (ref 0.00–0.07)
Basophils Absolute: 0 10*3/uL (ref 0.0–0.1)
Basophils Relative: 0 %
Eosinophils Absolute: 0.1 10*3/uL (ref 0.0–0.5)
Eosinophils Relative: 2 %
HCT: 34.9 % — ABNORMAL LOW (ref 36.0–46.0)
Hemoglobin: 10.7 g/dL — ABNORMAL LOW (ref 12.0–15.0)
Immature Granulocytes: 0 %
Lymphocytes Relative: 34 %
Lymphs Abs: 1.9 10*3/uL (ref 0.7–4.0)
MCH: 25.2 pg — ABNORMAL LOW (ref 26.0–34.0)
MCHC: 30.7 g/dL (ref 30.0–36.0)
MCV: 82.1 fL (ref 80.0–100.0)
Monocytes Absolute: 0.7 10*3/uL (ref 0.1–1.0)
Monocytes Relative: 12 %
Neutro Abs: 2.9 10*3/uL (ref 1.7–7.7)
Neutrophils Relative %: 52 %
Platelets: 300 10*3/uL (ref 150–400)
RBC: 4.25 MIL/uL (ref 3.87–5.11)
RDW: 15.8 % — ABNORMAL HIGH (ref 11.5–15.5)
WBC: 5.6 10*3/uL (ref 4.0–10.5)
nRBC: 0 % (ref 0.0–0.2)

## 2023-04-15 LAB — RESP PANEL BY RT-PCR (RSV, FLU A&B, COVID)  RVPGX2
Influenza A by PCR: NEGATIVE
Influenza B by PCR: NEGATIVE
Resp Syncytial Virus by PCR: NEGATIVE
SARS Coronavirus 2 by RT PCR: NEGATIVE

## 2023-04-15 LAB — COMPREHENSIVE METABOLIC PANEL
ALT: 30 U/L (ref 0–44)
AST: 31 U/L (ref 15–41)
Albumin: 3.9 g/dL (ref 3.5–5.0)
Alkaline Phosphatase: 58 U/L (ref 38–126)
Anion gap: 7 (ref 5–15)
BUN: 8 mg/dL (ref 6–20)
CO2: 24 mmol/L (ref 22–32)
Calcium: 8.9 mg/dL (ref 8.9–10.3)
Chloride: 102 mmol/L (ref 98–111)
Creatinine, Ser: 0.65 mg/dL (ref 0.44–1.00)
GFR, Estimated: >60 mL/min (ref >60)
Glucose, Bld: 95 mg/dL (ref 70–99)
Potassium: 3.9 mmol/L (ref 3.5–5.1)
Sodium: 133 mmol/L — ABNORMAL LOW (ref 135–145)
Total Bilirubin: 0.6 mg/dL (ref 0.3–1.2)
Total Protein: 7.8 g/dL (ref 6.5–8.1)

## 2023-04-15 LAB — LIPASE, BLOOD: Lipase: 30 U/L (ref 11–51)

## 2023-04-15 MED ORDER — ALBUTEROL SULFATE HFA 108 (90 BASE) MCG/ACT IN AERS: 2.0000 | INHALATION_SPRAY | Freq: Four times a day (QID) | RESPIRATORY_TRACT | 2 refills | Status: DC | PRN

## 2023-04-15 MED ORDER — SODIUM CHLORIDE 0.9 % IV BOLUS
1000.0000 mL | Freq: Once | INTRAVENOUS | Status: AC
  Administered 2023-04-15: 1000 mL via INTRAVENOUS

## 2023-04-15 MED ORDER — IPRATROPIUM-ALBUTEROL 0.5-2.5 (3) MG/3ML IN SOLN
3.0000 mL | Freq: Once | RESPIRATORY_TRACT | Status: AC
  Administered 2023-04-15: 3 mL via RESPIRATORY_TRACT
  Filled 2023-04-15: qty 3

## 2023-04-15 MED ORDER — ONDANSETRON HCL 4 MG PO TABS: 4.0000 mg | ORAL_TABLET | Freq: Three times a day (TID) | ORAL | 0 refills | Status: DC | PRN

## 2023-04-15 NOTE — Discharge Instructions (Addendum)
Please seek medical attention for any high fevers, chest pain, shortness of breath, change in behavior, persistent vomiting, bloody stool or any other new or concerning symptoms.  

## 2023-04-15 NOTE — ED Triage Notes (Signed)
Pt reports nausea and diarrhea x 3 days. Denies abd pain. Reports fatigue and body aches as well. Pt ambulatory to triage. Alert and oriented following commands. Reports using some type of rehydration tablets/supplement OTC in effort to help with minimal effect. Pt denies chest pain or pressure. Pt breathing unlabored speaking in full sentences with symmetric chest rise and fall.

## 2023-04-15 NOTE — ED Provider Notes (Signed)
[facility] Provider Note    Event Date/Time   First MD Initiated Contact with Patient 04/15/23 2107     (approximate)   History   Dehydration   HPI  Cindy Hays is a 42 y.o. female who presents to the emergency department today with concerns for dehydration as well as nausea, vomiting, diarrhea and shortness of breath.  The patient states that she has felt increased thirst recently.  She is worried she is dehydrated.  Developed the nausea vomiting diarrhea today.  Denies any blood in the vomiting or diarrhea.  No significant associated abdominal pain.  She has also noticed some increased shortness of breath today.  Denies history of any chronic lung disease.  No recent travel.  Patient is not on birth control.  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     Physical Exam   Triage Vital Signs: ED Triage Vitals  Enc Vitals Group     BP 04/15/23 1958 (!) 134/92     Pulse Rate 04/15/23 1958 (!) 103     Resp 04/15/23 1958 20     Temp 04/15/23 1958 98.4 F (36.9 C)     Temp Source 04/15/23 1958 Oral     SpO2 04/15/23 1958 100 %     Weight 04/15/23 1956 185 lb (83.9 kg)     Height 04/15/23 1956 5\' 2"  (1.575 m)     Head Circumference --      Peak Flow --      Pain Score 04/15/23 1956 0     Pain Loc --      Pain Edu? --      Excl. in [place]? --     Most recent vital signs: Vitals:   04/15/23 1958  BP: (!) 134/92  Pulse: (!) 103  Resp: 20  Temp: 98.4 F (36.9 C)  SpO2: 100%   General: Awake, alert, oriented. CV:  Good peripheral perfusion. Tachycardia. Resp:  Normal effort. Lungs clear. Abd:  No distention.    ED Results / Procedures / Treatments   Labs (all labs ordered are listed, but only abnormal results are displayed) Labs Reviewed  CBC WITH DIFFERENTIAL/PLATELET - Abnormal; Notable for the following components:      Result Value   Hemoglobin 10.7 (*)    HCT 34.9 (*)    MCH 25.2 (*)    RDW 15.8 (*)    All  other components within normal limits  COMPREHENSIVE METABOLIC PANEL - Abnormal; Notable for the following components:   Sodium 133 (*)    All other components within normal limits  LIPASE, BLOOD     EKG  I, Phineas Semen, attending physician, personally viewed and interpreted this EKG  EKG Time: 2004 Rate: 93 Rhythm: normal sinus rhythm Axis: normal Intervals: qtc 430 QRS: narrow ST changes: no st elevation Impression: normal ekg   RADIOLOGY I independently interpreted and visualized the CXR. My interpretation: No pneumonia Radiology interpretation:  IMPRESSION:  No active cardiopulmonary disease.     PROCEDURES:  Critical Care performed: No    MEDICATIONS ORDERED IN ED: Medications - No data to display   IMPRESSION / MDM / ASSESSMENT AND PLAN / ED COURSE  I reviewed the triage vital signs and the nursing notes.                              Differential diagnosis includes, but is not limited to, viral  illness, pneumonia, anemia, electrolyte abnormality  Patient's presentation is most consistent with acute presentation with potential threat to life or bodily function.   The patient is on the cardiac monitor to evaluate for evidence of arrhythmia and/or significant heart rate changes.  Patient presented to the emergency department today because of concern for dehydration, nausea, vomiting, diarrhea and shortness of breath. On exam patient without any concerning wheezing.  No abdominal tenderness.  Blood work without any concerning leukocytosis.  No concerning electrolyte abnormality.  Slight anemia however this is patient's baseline. Patient was giving breathing treatment and did feel better. Will give patient IV fluids as well.  Will plan on getting paperwork ready for discharge once IV fluids finished.  Will treat for viral syndrome.      FINAL CLINICAL IMPRESSION(S) / ED DIAGNOSES   Final diagnoses:  Viral illness      Note:  This document was  prepared using Dragon voice recognition software and may include unintentional dictation errors.     Phineas Semen, MD 04/15/23 [phone]

## 2023-04-15 NOTE — ED Triage Notes (Signed)
Pt now reporting SOB with exertion. Reports concern d/t recent syncopal event.

## 2023-04-16 ENCOUNTER — Other Ambulatory Visit: Payer: Self-pay

## 2023-04-16 MED ORDER — ALBUTEROL SULFATE HFA 108 (90 BASE) MCG/ACT IN AERS
2.0000 | INHALATION_SPRAY | Freq: Four times a day (QID) | RESPIRATORY_TRACT | 2 refills | Status: AC | PRN
  Filled 2023-04-16: qty 6.7, 25d supply, fill #0

## 2023-04-16 MED ORDER — ONDANSETRON HCL 4 MG PO TABS
4.0000 mg | ORAL_TABLET | Freq: Three times a day (TID) | ORAL | 0 refills | Status: DC | PRN
  Filled 2023-04-16: qty 20, 7d supply, fill #0

## 2023-04-17 ENCOUNTER — Other Ambulatory Visit: Payer: Self-pay

## 2023-04-17 DIAGNOSIS — R7989 Other specified abnormal findings of blood chemistry: Secondary | ICD-10-CM | POA: Diagnosis not present

## 2023-04-17 DIAGNOSIS — R899 Unspecified abnormal finding in specimens from other organs, systems and tissues: Secondary | ICD-10-CM | POA: Diagnosis not present

## 2023-04-17 MED ORDER — FERROUS SULFATE 220 (44 FE) MG/5ML PO SOLN
220.0000 mg | Freq: Two times a day (BID) | ORAL | 1 refills | Status: AC
  Filled 2023-04-17: qty 473, 47d supply, fill #0

## 2023-04-20 ENCOUNTER — Other Ambulatory Visit: Payer: Self-pay

## 2023-04-23 ENCOUNTER — Other Ambulatory Visit: Payer: Self-pay

## 2023-04-23 DIAGNOSIS — M25561 Pain in right knee: Secondary | ICD-10-CM | POA: Diagnosis not present

## 2023-04-23 DIAGNOSIS — I10 Essential (primary) hypertension: Secondary | ICD-10-CM | POA: Diagnosis not present

## 2023-04-23 DIAGNOSIS — G5601 Carpal tunnel syndrome, right upper limb: Secondary | ICD-10-CM | POA: Diagnosis not present

## 2023-04-23 DIAGNOSIS — G8929 Other chronic pain: Secondary | ICD-10-CM | POA: Diagnosis not present

## 2023-04-23 DIAGNOSIS — E782 Mixed hyperlipidemia: Secondary | ICD-10-CM | POA: Diagnosis not present

## 2023-04-23 DIAGNOSIS — M5481 Occipital neuralgia: Secondary | ICD-10-CM | POA: Diagnosis not present

## 2023-04-23 DIAGNOSIS — G44021 Chronic cluster headache, intractable: Secondary | ICD-10-CM | POA: Diagnosis not present

## 2023-04-23 DIAGNOSIS — D500 Iron deficiency anemia secondary to blood loss (chronic): Secondary | ICD-10-CM | POA: Diagnosis not present

## 2023-04-23 MED ORDER — VALACYCLOVIR HCL 1 G PO TABS
1000.0000 mg | ORAL_TABLET | Freq: Two times a day (BID) | ORAL | 0 refills | Status: AC
  Filled 2023-04-23: qty 14, 7d supply, fill #0

## 2023-04-23 MED ORDER — TRULANCE 3 MG PO TABS
3.0000 mg | ORAL_TABLET | Freq: Every day | ORAL | 1 refills | Status: DC
  Filled 2023-04-23 – 2023-05-07 (×2): qty 30, 30d supply, fill #0

## 2023-04-23 MED ORDER — CYCLOBENZAPRINE HCL 10 MG PO TABS
10.0000 mg | ORAL_TABLET | Freq: Two times a day (BID) | ORAL | 0 refills | Status: DC | PRN
  Filled 2023-04-23: qty 20, 10d supply, fill #0

## 2023-04-24 ENCOUNTER — Inpatient Hospital Stay: Payer: 59 | Attending: Oncology | Admitting: Oncology

## 2023-04-24 ENCOUNTER — Encounter: Payer: Self-pay | Admitting: Oncology

## 2023-04-24 ENCOUNTER — Inpatient Hospital Stay: Payer: 59

## 2023-04-24 VITALS — BP 116/77 | HR 85 | Temp 97.5°F | Resp 18 | Wt 187.0 lb

## 2023-04-24 DIAGNOSIS — D509 Iron deficiency anemia, unspecified: Secondary | ICD-10-CM | POA: Insufficient documentation

## 2023-04-24 DIAGNOSIS — Z79899 Other long term (current) drug therapy: Secondary | ICD-10-CM | POA: Diagnosis not present

## 2023-04-24 NOTE — Assessment & Plan Note (Addendum)
Labs are reviewed and discussed with patient, consistent with iron deficiency anemia.  I discussed about option of continue oral iron supplementation and repeat blood work for evaluation of treatment response.  If no significant improvement, then proceed with IV Venofer treatments. Alternative option of proceed with IV Venofer treatments. I discussed about the potential risks including but not limited to allergic reactions/infusion reactions including anaphylactic reactions, phlebitis, high blood pressure, wheezing, SOB, skin rash, weight gain,dark urine, leg swelling, back pain, headache, nausea and fatigue, etc. Patient prefers to try oral supplementation first.  I recommend patient to take Vitamin C - daily.  Avoid NSAIDS Repeat blood work in 4 weeks and 3 months.

## 2023-04-24 NOTE — Progress Notes (Addendum)
Hematology/Oncology Consult note [phone] [phone]      Patient Care Team: Enid Baas, MD as PCP - General (Internal Medicine) Christeen Douglas, MD as Consulting Physician (Obstetrics and Gynecology) Rickard Patience, MD as Consulting Physician (Hematology)   REFERRING PROVIDER: Enid Baas, MD  CHIEF COMPLAINTS/REASON FOR VISIT:  Anemia  ASSESSMENT & PLAN:   Iron deficiency anemia Labs are reviewed and discussed with patient, consistent with iron deficiency anemia.  I discussed about option of continue oral iron supplementation and repeat blood work for evaluation of treatment response.  If no significant improvement, then proceed with IV Venofer treatments. Alternative option of proceed with IV Venofer treatments. I discussed about the potential risks including but not limited to allergic reactions/infusion reactions including anaphylactic reactions, phlebitis, high blood pressure, wheezing, SOB, skin rash, weight gain,dark urine, leg swelling, back pain, headache, nausea and fatigue, etc. Patient prefers to try oral supplementation first.  I recommend patient to take Vitamin C - daily.  Avoid NSAIDS Repeat blood work in 4 weeks and 3 months.   Orders Placed This Encounter  Procedures   CBC with Differential (Cancer Center Only)    Standing Status:   Future    Standing Expiration Date:   04/23/2024   Iron and TIBC    Standing Status:   Future    Standing Expiration Date:   04/23/2024   Ferritin    Standing Status:   Future    Standing Expiration Date:   04/23/2024   Vitamin B12    Standing Status:   Future    Standing Expiration Date:   04/23/2024   Retic Panel    Standing Status:   Future    Standing Expiration Date:   04/23/2024   CBC with Differential (Cancer Center Only)    Standing Status:   Future    Standing Expiration Date:   04/23/2024   Iron and TIBC    Standing Status:   Future    Standing Expiration Date:   04/23/2024    Ferritin    Standing Status:   Future    Standing Expiration Date:   04/23/2024    All questions were answered. The patient knows to call the clinic with any problems, questions or concerns.  Rickard Patience, MD, PhD [facility] Hematology Oncology 04/24/2023     HISTORY OF PRESENTING ILLNESS:  Cindy Hays is a  42 y.o.  female with PMH listed below who was referred to me for anemia Reviewed patient's recent labs that was done.  She was found to have abnormal CBC on 04/15/2023 which showed hemoglobin of 10.7, mcv 82,  04/17/2023 Iron saturation 7, TIBC 490, ferritin 8 Reviewed patient's previous labs ordered by primary care physician's office, anemia is chronic onset , duration is since 2016 She has a history of IDA, due to heavy menstrual period, she is s/p hysterectomy.   She has started taking liquid ferrous sulfate 220mg  daily, she tried to take twice and is not able to tolerate due to headache.  She denies recent chest pain on exertion, shortness of breath on minimal exertion, pre-syncopal episodes, or palpitations She had not noticed any recent bleeding such as epistaxis, hematuria or hematochezia.  She takes NSAIDs intermittently for joint pain.   MEDICAL HISTORY:  Past Medical History:  Diagnosis Date   Anemia    B12 deficiency    Gallstones 10/2022   History of gestational hypertension    Hypertension    Preterm labor    Uterine leiomyoma  SURGICAL HISTORY: Past Surgical History:  Procedure Laterality Date   ROBOTIC ASSISTED LAPAROSCOPIC HYSTERECTOMY AND SALPINGECTOMY Bilateral 12/20/2021   Procedure: XI ROBOTIC ASSISTED LAPAROSCOPIC HYSTERECTOMY AND SALPINGECTOMY;  Surgeon: Christeen Douglas, MD;  Location: [facility] ORS;  Service: Gynecology;  Laterality: Bilateral;    SOCIAL HISTORY: Social History   Socioeconomic History   Marital status: Single    Spouse name: Not on file   Number of children: 5   Years of education: Not on file   Highest education level:  Not on file  Occupational History   Not on file  Tobacco Use   Smoking status: Never   Smokeless tobacco: Never  Vaping Use   Vaping Use: Never used  Substance and Sexual Activity   Alcohol use: Not Currently    Alcohol/week: 1.0 standard drink of alcohol    Types: 1 Glasses of wine per week    Comment: occ wine   Drug use: No   Sexual activity: Not Currently  Other Topics Concern   Not on file  Social History Narrative   Not on file   Social Determinants of Health   Financial Resource Strain: Not on file  Food Insecurity: Not on file  Transportation Needs: Not on file  Physical Activity: Not on file  Stress: Not on file  Social Connections: Not on file  Intimate Partner Violence: Not on file    FAMILY HISTORY: Family History  Problem Relation Age of Onset   Hypertension Mother    Alcohol abuse Mother    Liver disease Mother    Other Father        unknown medical history   Hypertension Maternal Grandmother    Diabetes Maternal Grandmother    Dementia Maternal Grandmother    Autoimmune disease Daughter     ALLERGIES:  is allergic to vicodin [hydrocodone-acetaminophen].  MEDICATIONS:  Current Outpatient Medications  Medication Sig Dispense Refill   albuterol (VENTOLIN HFA) 108 (90 Base) MCG/ACT inhaler Inhale 2 puffs into the lungs every 6 (six) hours as needed for wheezing or shortness of breath. 6.7 g 2   Atogepant (QULIPTA PO) Take 1 tablet by mouth daily.     butalbital-acetaminophen-caffeine (FIORICET) 50-325-40 MG tablet Take 2 tablets by mouth every 4 (four) hours as needed for pain 30 tablet 0   cyclobenzaprine (FLEXERIL) 10 MG tablet Take 10 mg by mouth at bedtime as needed for muscle spasms.     cyclobenzaprine (FLEXERIL) 10 MG tablet Take 1 tablet (10 mg total) by mouth 2 (two) times daily between meals as needed  for muscle spasms for up to 10 days 20 tablet 0   cyclobenzaprine (FLEXERIL) 10 MG tablet Take by mouth.     dihydroergotamine (MIGRANAL) 4  MG/ML nasal spray Place 1 spray into both nostrils once as needed for Migraine for up to 1 dose. May repeat in 15 minutes, if needed. (see pamphlet on how to prepare it). 10 kit with 6 refills. 10 mL 6   dihydroergotamine (MIGRANAL) 4 MG/ML nasal spray Place into the nose.     Erenumab-aooe (AIMOVIG) 140 MG/ML SOAJ Inject 140 mg into the skin every 28 (twenty-eight) days. 1 mL 6   estradiol (ESTRACE) 0.1 MG/GM vaginal cream SMARTSIG:2 Gram(s) Vaginal Twice a Week     estradiol (ESTRACE) 0.1 MG/GM vaginal cream Place 2 g vaginally 2 (two) times a week. 42.5 g 1   ferrous sulfate 220 (44 Fe) MG/5ML solution Take 220 mg by mouth 2 (two) times daily with a meal.  ferrous sulfate 220 (44 Fe) MG/5ML solution Take 5 mLs (220 mg total) by mouth 2 (two) times daily. 473 mL 1   fluticasone (FLONASE) 50 MCG/ACT nasal spray Place 1 spray into both nostrils daily. 16 g 1   HYDROcodone-acetaminophen (NORCO) 5-325 MG tablet Take 1 tablet by mouth every 6 (six) hours as needed for up to 6 doses for moderate pain. 6 tablet 0   hydrocortisone 2.5 % ointment Apply 1 Application topically 2 (two) times daily  for 7 days. 28.35 g 0   ibuprofen (ADVIL) 800 MG tablet Take 1 tablet (800 mg total) by mouth every 8 (eight) hours as needed for mild pain or moderate pain. 30 tablet 0   magic mouthwash (nystatin, lidocaine, diphenhydrAMINE, alum & mag hydroxide) suspension Swish and spit 5 mLs 4 (four) times daily as needed for mouth pain. 180 mL 0   ondansetron (ZOFRAN) 4 MG tablet Take 1 tablet (4 mg total) by mouth daily as needed for nausea or vomiting. 30 tablet 1   ondansetron (ZOFRAN) 4 MG tablet Take 1 tablet (4 mg total) by mouth every 8 (eight) hours as needed. 20 tablet 0   Plecanatide (TRULANCE) 3 MG TABS Take 1 tablet (3 mg total) by mouth daily. 30 tablet 1   promethazine-dextromethorphan (PROMETHAZINE-DM) 6.25-15 MG/5ML syrup Take 5 mLs by mouth 4 (four) times daily as needed for cough. 118 mL 0   Rimegepant  Sulfate (NURTEC) 75 MG TBDP Take 1 tablet (75 mg total) by mouth as directed at onset of migraine once a day as needed. 16 tablet 3   tirzepatide (ZEPBOUND) 2.5 MG/0.5ML Pen Inject 2.5 mg into the skin once a week. 2 mL 1   valACYclovir (VALTREX) 1000 MG tablet Take 1 tablet (1,000 mg total) by mouth 2 (two) times daily for 7 days. 14 tablet 0   No current facility-administered medications for this visit.    Review of Systems  Constitutional:  Positive for fatigue. Negative for appetite change, chills and fever.  HENT:   Negative for hearing loss and voice change.   Eyes:  Negative for eye problems.  Respiratory:  Negative for chest tightness and cough.   Cardiovascular:  Negative for chest pain.  Gastrointestinal:  Negative for abdominal distention, abdominal pain and blood in stool.  Endocrine: Negative for hot flashes.  Genitourinary:  Negative for difficulty urinating and frequency.   Musculoskeletal:  Negative for arthralgias.  Skin:  Negative for itching and rash.  Neurological:  Positive for headaches. Negative for extremity weakness.  Hematological:  Negative for adenopathy.  Psychiatric/Behavioral:  Negative for confusion.     PHYSICAL EXAMINATION: Vitals:   04/24/23 0929  BP: 116/77  Pulse: 85  Resp: 18  Temp: (!) 97.5 F (36.4 C)  SpO2: 100%   Filed Weights   04/24/23 0929  Weight: 187 lb (84.8 kg)    Physical Exam Constitutional:      General: She is not in acute distress. HENT:     Head: Normocephalic and atraumatic.  Eyes:     General: No scleral icterus. Cardiovascular:     Rate and Rhythm: Normal rate and regular rhythm.     Heart sounds: Normal heart sounds.  Pulmonary:     Effort: Pulmonary effort is normal. No respiratory distress.     Breath sounds: No wheezing.  Abdominal:     General: Bowel sounds are normal. There is no distension.     Palpations: Abdomen is soft.  Musculoskeletal:  General: No deformity. Normal range of motion.      Cervical back: Normal range of motion and neck supple.  Skin:    General: Skin is warm and dry.     Findings: No erythema or rash.  Neurological:     Mental Status: She is alert and oriented to person, place, and time. Mental status is at baseline.     Cranial Nerves: No cranial nerve deficit.     Coordination: Coordination normal.  Psychiatric:        Mood and Affect: Mood normal.      LABORATORY DATA:  I have reviewed the data as listed    Latest Ref Rng & Units 04/15/2023    8:07 PM 03/17/2023    9:58 AM 01/12/2023    8:44 AM  CBC  WBC 4.0 - 10.5 K/uL 5.6  4.6  4.3   Hemoglobin 12.0 - 15.0 g/dL 13.2  44.0  9.7   Hematocrit 36.0 - 46.0 % 34.9  32.9  31.9   Platelets 150 - 400 K/uL 300  292  289       Latest Ref Rng & Units 04/15/2023    8:07 PM 03/17/2023    9:58 AM 01/12/2023    8:44 AM  CMP  Glucose 70 - 99 mg/dL 95  102  725   BUN 6 - 20 mg/dL 8  8  10    Creatinine 0.44 - 1.00 mg/dL 3.66  4.40  3.47   Sodium 135 - 145 mmol/L 133  134  137   Potassium 3.5 - 5.1 mmol/L 3.9  3.5  3.3   Chloride 98 - 111 mmol/L 102  103  105   CO2 22 - 32 mmol/L 24  24  25    Calcium 8.9 - 10.3 mg/dL 8.9  9.1  8.7   Total Protein 6.5 - 8.1 g/dL 7.8     Total Bilirubin 0.3 - 1.2 mg/dL 0.6     Alkaline Phos 38 - 126 U/L 58     AST 15 - 41 U/L 31     ALT 0 - 44 U/L 30      Lab Results  Component Value Date   IRON 182 (H) 08/03/2012   TIBC 599 (H) 08/03/2012   IRONPCTSAT 30 08/03/2012   FERRITIN 10 08/03/2012     RADIOGRAPHIC STUDIES: I have personally reviewed the radiological images as listed and agreed with the findings in the report. DG Chest 2 View  Result Date: 04/15/2023 CLINICAL DATA:  Shortness of breath EXAM: CHEST - 2 VIEW COMPARISON:  11/08/2022 FINDINGS: The heart size and mediastinal contours are within normal limits. Both lungs are clear. The visualized skeletal structures are unremarkable. IMPRESSION: No active cardiopulmonary disease. Electronically Signed   By: Jasmine Pang M.D.   On: 04/15/2023 20:45

## 2023-04-28 ENCOUNTER — Encounter: Payer: Self-pay | Admitting: Oncology

## 2023-04-29 ENCOUNTER — Other Ambulatory Visit: Payer: Self-pay

## 2023-04-29 DIAGNOSIS — D509 Iron deficiency anemia, unspecified: Secondary | ICD-10-CM

## 2023-04-29 MED ORDER — HYDROXYZINE HCL 10 MG PO TABS
10.0000 mg | ORAL_TABLET | Freq: Every day | ORAL | 0 refills | Status: DC | PRN
  Filled 2023-04-29: qty 10, 10d supply, fill #0

## 2023-04-30 ENCOUNTER — Other Ambulatory Visit: Payer: Self-pay

## 2023-04-30 MED ORDER — AMOXICILLIN 500 MG PO CAPS
500.0000 mg | ORAL_CAPSULE | Freq: Three times a day (TID) | ORAL | 0 refills | Status: DC
  Filled 2023-04-30: qty 21, 7d supply, fill #0

## 2023-05-08 ENCOUNTER — Other Ambulatory Visit: Payer: Self-pay

## 2023-05-08 MED ORDER — AMOXICILLIN 500 MG PO CAPS
500.0000 mg | ORAL_CAPSULE | Freq: Three times a day (TID) | ORAL | 0 refills | Status: DC
  Filled 2023-05-08: qty 15, 5d supply, fill #0

## 2023-05-11 ENCOUNTER — Other Ambulatory Visit: Payer: Self-pay

## 2023-05-12 ENCOUNTER — Other Ambulatory Visit: Payer: Self-pay | Admitting: Sports Medicine

## 2023-05-12 DIAGNOSIS — N920 Excessive and frequent menstruation with regular cycle: Secondary | ICD-10-CM | POA: Insufficient documentation

## 2023-05-12 DIAGNOSIS — S83014A Lateral dislocation of right patella, initial encounter: Secondary | ICD-10-CM

## 2023-05-12 DIAGNOSIS — Z86018 Personal history of other benign neoplasm: Secondary | ICD-10-CM | POA: Insufficient documentation

## 2023-05-13 NOTE — Progress Notes (Unsigned)
Celso Amy, PA-C [address]  [address]  [place], [place] [address]  Main: [phone]  Fax: [phone]   Gastroenterology Consultation  Referring Provider:     Rickard Patience, MD Primary Care Physician:  Enid Baas, MD Primary Gastroenterologist:  Celso Amy, PA-C / Dr. Midge Minium  Reason for Consultation:     Iron deficiency anemia        HPI:   Cindy Hays is a 42 y.o. y/o female referred for consultation & management  by Enid Baas, MD.    She saw Dr. Cathie Hoops hematologist 04/24/2023 for iron deficiency anemia.  Continued on oral iron.  History of chronic iron deficiency anemia since 2016.  Previous hysterectomy for menorrhagia.  Denies rectal bleeding, epistaxis, hematuria, melena, or hematochezia.  Occasionally takes OTC NSAIDs.  Had cholecystectomy for cholelithiasis 10/2022.  Last labs 04/15/2023 showed hemoglobin 10.7, improved and stable.  Patient previously saw [facility] 08/2019 for anemia and chronic constipation.  Was given Linzess.  Tried MiraLAX with little benefit.  Has history of chronic microcytic anemia.  Was scheduled for a colonoscopy but was not performed.  Family history significant for mother deceased due to liver disease related to alcohol/HCV.  No family history of colon cancer or GI malignancies.  No previous EGD or colonoscopy.  Current symptoms: She has chronic constipation.  Typically has bowel movement every 4 days.  She takes Trulance sporadically which helps.  She has not been consistent with taking Trulance.  She denies any abdominal pain or diarrhea.  Denies heartburn or dysphagia.  She is taking OTC vitamin B-12 and iron with benefit.  Energy has improved.  Denies heartburn, or dysphagia.  Denies family history of colon cancer, celiac, or IBD.  Admits to taking moderate ibuprofen as needed for pain.    Past Medical History:  Diagnosis Date   Anemia    B12 deficiency    Gallstones 10/2022   History of  gestational hypertension    Hypertension    Preterm labor    Uterine leiomyoma     Past Surgical History:  Procedure Laterality Date   ROBOTIC ASSISTED LAPAROSCOPIC HYSTERECTOMY AND SALPINGECTOMY Bilateral 12/20/2021   Procedure: XI ROBOTIC ASSISTED LAPAROSCOPIC HYSTERECTOMY AND SALPINGECTOMY;  Surgeon: Christeen Douglas, MD;  Location: [facility] ORS;  Service: Gynecology;  Laterality: Bilateral;    Prior to Admission medications   Medication Sig Start Date End Date Taking? Authorizing Provider  loratadine (CLARITIN) 10 MG tablet Take 10 mg by mouth daily. 02/03/20 05/13/20  [provider]  albuterol (VENTOLIN HFA) 108 (90 Base) MCG/ACT inhaler Inhale 2 puffs into the lungs every 6 (six) hours as needed for wheezing or shortness of breath. 04/16/23   Sharman Cheek, MD  amoxicillin (AMOXIL) 500 MG capsule Take 1 capsule (500 mg total) by mouth 3 (three) times daily until gone. 05/08/23     Atogepant (QULIPTA PO) Take 1 tablet by mouth daily.    [provider]  butalbital-acetaminophen-caffeine (FIORICET) 50-325-40 MG tablet Take 2 tablets by mouth every 4 (four) hours as needed for pain 02/19/23   Fields, Rivka Barbara L, NP  cyclobenzaprine (FLEXERIL) 10 MG tablet Take 10 mg by mouth at bedtime as needed for muscle spasms.    [provider]  cyclobenzaprine (FLEXERIL) 10 MG tablet Take 1 tablet (10 mg total) by mouth 2 (two) times daily between meals as needed  for muscle spasms for up to 10 days 04/23/23     dihydroergotamine (MIGRANAL) 4 MG/ML nasal spray  Place 1 spray into both nostrils once as needed for Migraine for up to 1 dose. May repeat in 15 minutes, if needed. (see pamphlet on how to prepare it). 10 kit with 6 refills. 03/11/23     dihydroergotamine (MIGRANAL) 4 MG/ML nasal spray Place into the nose. 03/11/23   [provider]  Erenumab-aooe (AIMOVIG) 140 MG/ML SOAJ Inject 140 mg into the skin every 28 (twenty-eight) days. 12/24/22     estradiol (ESTRACE) 0.1  MG/GM vaginal cream SMARTSIG:2 Gram(s) Vaginal Twice a Week    [provider]  estradiol (ESTRACE) 0.1 MG/GM vaginal cream Place 2 g vaginally 2 (two) times a week. 12/15/22     ferrous sulfate 220 (44 Fe) MG/5ML solution Take 220 mg by mouth 2 (two) times daily with a meal.    [provider]  ferrous sulfate 220 (44 Fe) MG/5ML solution Take 5 mLs (220 mg total) by mouth 2 (two) times daily. 04/17/23     fluticasone (FLONASE) 50 MCG/ACT nasal spray Place 1 spray into both nostrils daily. 12/19/22     HYDROcodone-acetaminophen (NORCO) 5-325 MG tablet Take 1 tablet by mouth every 6 (six) hours as needed for up to 6 doses for moderate pain. 11/06/22   Tonna Boehringer, Isami, DO  hydrocortisone 2.5 % ointment Apply 1 Application topically 2 (two) times daily  for 7 days. 01/21/23     hydrOXYzine (ATARAX) 10 MG tablet Take 1 tablet (10 mg total) by mouth daily as needed for Anxiety (flight travel) for up to 10 days 04/29/23     ibuprofen (ADVIL) 800 MG tablet Take 1 tablet (800 mg total) by mouth every 8 (eight) hours as needed for mild pain or moderate pain. 12/16/22   Valinda Hoar, NP  magic mouthwash (nystatin, lidocaine, diphenhydrAMINE, alum & mag hydroxide) suspension Swish and spit 5 mLs 4 (four) times daily as needed for mouth pain. 12/16/22   White, Elita Boone, NP  ondansetron (ZOFRAN) 4 MG tablet Take 1 tablet (4 mg total) by mouth daily as needed for nausea or vomiting. 11/08/22 11/08/23  Pilar Jarvis, MD  ondansetron (ZOFRAN) 4 MG tablet Take 1 tablet (4 mg total) by mouth every 8 (eight) hours as needed. 04/16/23   Sharman Cheek, MD  Plecanatide (TRULANCE) 3 MG TABS Take 1 tablet (3 mg total) by mouth daily. 04/23/23     promethazine-dextromethorphan (PROMETHAZINE-DM) 6.25-15 MG/5ML syrup Take 5 mLs by mouth 4 (four) times daily as needed for cough. 12/19/22   Radford Pax, NP  Rimegepant Sulfate (NURTEC) 75 MG TBDP Take 1 tablet (75 mg total) by mouth as directed at onset of migraine once  a day as needed. 03/09/23     tirzepatide (ZEPBOUND) 2.5 MG/0.5ML Pen Inject 2.5 mg into the skin once a week. 12/01/22     XULANE 150-35 MCG/24HR transdermal patch 1 patch once a week. 09/04/19 11/18/19  [provider]    Family History  Problem Relation Age of Onset   Hypertension Mother    Alcohol abuse Mother    Liver disease Mother    Other Father        unknown medical history   Hypertension Maternal Grandmother    Diabetes Maternal Grandmother    Dementia Maternal Grandmother    Autoimmune disease Daughter      Social History   Tobacco Use   Smoking status: Never   Smokeless tobacco: Never  Vaping Use   Vaping Use: Never used  Substance Use Topics   Alcohol use: Not  Currently    Alcohol/week: 1.0 standard drink of alcohol    Types: 1 Glasses of wine per week    Comment: occ wine   Drug use: No    Allergies as of 05/14/2023 - Review Complete 05/14/2023  Allergen Reaction Noted   Vicodin [hydrocodone-acetaminophen] Rash 09/17/2015    Review of Systems:    All systems reviewed and negative except where noted in HPI.   Physical Exam:  BP 134/84   Pulse 96   Temp 98.6 F (37 C)   Ht 5\' 1"  (1.549 m)   Wt 191 lb 9.6 oz (86.9 kg)   LMP 10/27/2021 (Exact Date) Comment: having depot shots  BMI 36.20 kg/m  Patient's last menstrual period was 10/27/2021 (exact date). Psych:  Alert and cooperative. Normal mood and affect. General:   Alert,  Well-developed, well-nourished, pleasant and cooperative in NAD Head:  Normocephalic and atraumatic. Eyes:  Sclera clear, no icterus.   Conjunctiva pink. Neck:  Supple; no masses or thyromegaly. Lungs:  Respirations even and unlabored.  Clear throughout to auscultation.   No wheezes, crackles, or rhonchi. No acute distress. Heart:  Regular rate and rhythm; no murmurs, clicks, rubs, or gallops. Abdomen:  Normal bowel sounds.  No bruits.  Soft, and non-distended without masses, hepatosplenomegaly or hernias noted.  No  Tenderness.  No guarding or rebound tenderness.    Neurologic:  Alert and oriented x3;  grossly normal neurologically. Psych:  Alert and cooperative. Normal mood and affect.  Imaging Studies: DG Chest 2 View  Result Date: 04/15/2023 CLINICAL DATA:  Shortness of breath EXAM: CHEST - 2 VIEW COMPARISON:  11/08/2022 FINDINGS: The heart size and mediastinal contours are within normal limits. Both lungs are clear. The visualized skeletal structures are unremarkable. IMPRESSION: No active cardiopulmonary disease. Electronically Signed   By: Jasmine Pang M.D.   On: 04/15/2023 20:45    Assessment and Plan:   Chastelyn Athens is a 42 y.o. y/o female has been referred for chronic iron deficiency and B12 deficiency anemia.  She has had hysterectomy.  Anemia has currently improved on iron and B12 supplements.  No previous EGD or colonoscopy.  I am scheduling EGD and colonoscopy for further evaluation of sources of anemia.  Also check celiac labs.  She is advised to avoid ibuprofen and NSAIDs which can cause stomach ulcers and gastritis.  1.  Chronic iron deficiency anemia & B12 Deficiency  Scheduling EGD & Colonoscopy I discussed risks of EGD and colonoscopy with patient to include risk of bleeding, perforation, and risk of sedation.  Patient expressed understanding and agrees to proceed with procedures.     Continue oral iron and B12 Vitamin.  Avoid NSAIDS.  Stop Ibuprofen.  OK to take Tylenol / acetaminophen in moderation.  Labs: CBC, iron panel, ferritin, B12, Celiac.  2.  Chronic Constipation - Improved on Trulance Recommend High Fiber diet with fruits, vegetables, and whole grains. Drink 64 ounces of Fluids Daily. Continue Trulance 3mg  1 tablet daily - Take every day.       Follow up in 3 Months.  Celso Amy, PA-C

## 2023-05-14 ENCOUNTER — Other Ambulatory Visit: Payer: Self-pay

## 2023-05-14 ENCOUNTER — Telehealth: Payer: Self-pay

## 2023-05-14 ENCOUNTER — Ambulatory Visit (INDEPENDENT_AMBULATORY_CARE_PROVIDER_SITE_OTHER): Payer: 59 | Admitting: Physician Assistant

## 2023-05-14 ENCOUNTER — Encounter: Payer: Self-pay | Admitting: Physician Assistant

## 2023-05-14 VITALS — BP 134/84 | HR 96 | Temp 98.6°F | Ht 61.0 in | Wt 191.6 lb

## 2023-05-14 DIAGNOSIS — D509 Iron deficiency anemia, unspecified: Secondary | ICD-10-CM | POA: Diagnosis not present

## 2023-05-14 DIAGNOSIS — E538 Deficiency of other specified B group vitamins: Secondary | ICD-10-CM | POA: Diagnosis not present

## 2023-05-14 DIAGNOSIS — K5904 Chronic idiopathic constipation: Secondary | ICD-10-CM | POA: Diagnosis not present

## 2023-05-14 MED ORDER — TRULANCE 3 MG PO TABS
3.0000 mg | ORAL_TABLET | Freq: Every day | ORAL | 3 refills | Status: AC
  Filled 2023-05-14 – 2023-07-16 (×2): qty 90, 90d supply, fill #0

## 2023-05-14 MED ORDER — LUBIPROSTONE 8 MCG PO CAPS
8.0000 ug | ORAL_CAPSULE | Freq: Two times a day (BID) | ORAL | 2 refills | Status: DC
  Filled 2023-05-14: qty 60, 30d supply, fill #0

## 2023-05-14 NOTE — Telephone Encounter (Signed)
Per [employer] pt insurance will expire on 06-17-2023. Pt states she will call [employer] if it's extended pass 06-17-2023 she will let me know so that I can get it approved though [employer] for now pt will only use [employer] for coverage.

## 2023-05-18 ENCOUNTER — Ambulatory Visit
Admission: RE | Admit: 2023-05-18 | Discharge: 2023-05-18 | Disposition: A | Discharge status: Home / Self Care | Payer: 59 | Source: Ambulatory Visit | Attending: Physician Assistant | Admitting: Physician Assistant

## 2023-05-18 ENCOUNTER — Other Ambulatory Visit: Payer: Self-pay

## 2023-05-18 ENCOUNTER — Telehealth: Payer: Self-pay

## 2023-05-18 ENCOUNTER — Ambulatory Visit
Admission: RE | Admit: 2023-05-18 | Discharge: 2023-05-18 | Disposition: A | Discharge status: Home / Self Care | Payer: 59 | Source: Ambulatory Visit | Attending: Gastroenterology | Admitting: Gastroenterology

## 2023-05-18 ENCOUNTER — Ambulatory Visit
Admission: RE | Admit: 2023-05-18 | Discharge: 2023-05-18 | Disposition: A | Discharge status: Home / Self Care | Payer: 59 | Attending: Gastroenterology | Admitting: Gastroenterology

## 2023-05-18 DIAGNOSIS — K5904 Chronic idiopathic constipation: Secondary | ICD-10-CM | POA: Insufficient documentation

## 2023-05-18 DIAGNOSIS — K5900 Constipation, unspecified: Secondary | ICD-10-CM | POA: Diagnosis not present

## 2023-05-18 DIAGNOSIS — D509 Iron deficiency anemia, unspecified: Secondary | ICD-10-CM | POA: Diagnosis not present

## 2023-05-18 DIAGNOSIS — I878 Other specified disorders of veins: Secondary | ICD-10-CM | POA: Diagnosis not present

## 2023-05-18 NOTE — Telephone Encounter (Signed)
Inetta Fermo has sign the order

## 2023-05-18 NOTE — Telephone Encounter (Signed)
Patient states she will do this on her lunch break

## 2023-05-18 NOTE — Telephone Encounter (Signed)
Patient states she began taking the Trulance on Thursday. She states that she had a little bowel movement on Friday which was diarrhea. She states not bowel movement on Saturday and yesterday and little bowel movement on Sunday which was diarrhea. She states that she is having rectum pressure and some pain. She states she is feels like she needs to have a bowel movement but can not. She states if she passes gas it is relived a little bit but not much. She wants to know what is recommended. Patient states that she is coming for labs today

## 2023-05-18 NOTE — Telephone Encounter (Signed)
Order the xray and left a message for call back

## 2023-05-18 NOTE — Telephone Encounter (Signed)
Patient called and she was unable to get the X-ray because the referral was not sign.

## 2023-05-19 ENCOUNTER — Encounter: Payer: Self-pay | Admitting: Physician Assistant

## 2023-05-19 ENCOUNTER — Other Ambulatory Visit: Payer: Self-pay

## 2023-05-19 LAB — IRON,TIBC AND FERRITIN PANEL
Ferritin: 19 ng/mL (ref 15–150)
Iron Saturation: 12 % — ABNORMAL LOW (ref 15–55)
Iron: 55 ug/dL (ref 27–159)
Total Iron Binding Capacity: 442 ug/dL (ref 250–450)
UIBC: 387 ug/dL (ref 131–425)

## 2023-05-19 LAB — CBC
Hematocrit: 34.1 % (ref 34.0–46.6)
Hemoglobin: 10.9 g/dL — ABNORMAL LOW (ref 11.1–15.9)
MCH: 26.2 pg — ABNORMAL LOW (ref 26.6–33.0)
MCHC: 32 g/dL (ref 31.5–35.7)
MCV: 82 fL (ref 79–97)
Platelets: 356 10*3/uL (ref 150–450)
RBC: 4.16 x10E6/uL (ref 3.77–5.28)
RDW: 17.1 % — ABNORMAL HIGH (ref 11.7–15.4)
WBC: 5.4 10*3/uL (ref 3.4–10.8)

## 2023-05-19 LAB — CELIAC DISEASE AB SCREEN W/RFX
Antigliadin Abs, IgA: 5 units (ref 0–19)
IgA/Immunoglobulin A, Serum: 359 mg/dL — ABNORMAL HIGH (ref 87–352)
Transglutaminase IgA: <2 U/mL (ref 0–3)

## 2023-05-19 LAB — VITAMIN B12: Vitamin B-12: 719 pg/mL (ref 232–1245)

## 2023-05-20 ENCOUNTER — Encounter: Payer: Self-pay | Admitting: Sports Medicine

## 2023-05-21 ENCOUNTER — Emergency Department
Admission: EM | Admit: 2023-05-21 | Discharge: 2023-05-21 | Disposition: A | Discharge status: Home / Self Care | Payer: 59 | Source: Home / Self Care | Attending: Emergency Medicine | Admitting: Emergency Medicine

## 2023-05-21 ENCOUNTER — Other Ambulatory Visit: Payer: Self-pay

## 2023-05-21 ENCOUNTER — Emergency Department: Payer: 59

## 2023-05-21 DIAGNOSIS — D649 Anemia, unspecified: Secondary | ICD-10-CM | POA: Diagnosis not present

## 2023-05-21 DIAGNOSIS — R110 Nausea: Secondary | ICD-10-CM | POA: Diagnosis not present

## 2023-05-21 DIAGNOSIS — R000 Tachycardia, unspecified: Secondary | ICD-10-CM | POA: Diagnosis not present

## 2023-05-21 DIAGNOSIS — R202 Paresthesia of skin: Secondary | ICD-10-CM | POA: Insufficient documentation

## 2023-05-21 DIAGNOSIS — R4182 Altered mental status, unspecified: Secondary | ICD-10-CM | POA: Diagnosis not present

## 2023-05-21 DIAGNOSIS — R519 Headache, unspecified: Secondary | ICD-10-CM | POA: Insufficient documentation

## 2023-05-21 LAB — COMPREHENSIVE METABOLIC PANEL
ALT: 48 U/L — ABNORMAL HIGH (ref 0–44)
AST: 46 U/L — ABNORMAL HIGH (ref 15–41)
Albumin: 4.1 g/dL (ref 3.5–5.0)
Alkaline Phosphatase: 55 U/L (ref 38–126)
Anion gap: 9 (ref 5–15)
BUN: 8 mg/dL (ref 6–20)
CO2: 20 mmol/L — ABNORMAL LOW (ref 22–32)
Calcium: 9.1 mg/dL (ref 8.9–10.3)
Chloride: 105 mmol/L (ref 98–111)
Creatinine, Ser: 0.71 mg/dL (ref 0.44–1.00)
GFR, Estimated: >60 mL/min (ref >60)
Glucose, Bld: 107 mg/dL — ABNORMAL HIGH (ref 70–99)
Potassium: 3.4 mmol/L — ABNORMAL LOW (ref 3.5–5.1)
Sodium: 134 mmol/L — ABNORMAL LOW (ref 135–145)
Total Bilirubin: 0.7 mg/dL (ref 0.3–1.2)
Total Protein: 8 g/dL (ref 6.5–8.1)

## 2023-05-21 LAB — CBC
HCT: 38.3 % (ref 36.0–46.0)
Hemoglobin: 11.9 g/dL — ABNORMAL LOW (ref 12.0–15.0)
MCH: 25.9 pg — ABNORMAL LOW (ref 26.0–34.0)
MCHC: 31.1 g/dL (ref 30.0–36.0)
MCV: 83.4 fL (ref 80.0–100.0)
Platelets: 318 10*3/uL (ref 150–400)
RBC: 4.59 MIL/uL (ref 3.87–5.11)
RDW: 16.8 % — ABNORMAL HIGH (ref 11.5–15.5)
WBC: 5.2 10*3/uL (ref 4.0–10.5)
nRBC: 0.4 % — ABNORMAL HIGH (ref 0.0–0.2)

## 2023-05-21 LAB — DIFFERENTIAL
Abs Immature Granulocytes: 0.02 10*3/uL (ref 0.00–0.07)
Basophils Absolute: 0 10*3/uL (ref 0.0–0.1)
Basophils Relative: 1 %
Eosinophils Absolute: 0.1 10*3/uL (ref 0.0–0.5)
Eosinophils Relative: 1 %
Immature Granulocytes: 0 %
Lymphocytes Relative: 33 %
Lymphs Abs: 1.7 10*3/uL (ref 0.7–4.0)
Monocytes Absolute: 0.7 10*3/uL (ref 0.1–1.0)
Monocytes Relative: 13 %
Neutro Abs: 2.7 10*3/uL (ref 1.7–7.7)
Neutrophils Relative %: 52 %

## 2023-05-21 LAB — PROTIME-INR
INR: 1 (ref 0.8–1.2)
Prothrombin Time: 13.8 seconds (ref 11.4–15.2)

## 2023-05-21 LAB — ETHANOL: Alcohol, Ethyl (B): <10 mg/dL (ref <10)

## 2023-05-21 LAB — PREGNANCY, URINE: Preg Test, Ur: NEGATIVE

## 2023-05-21 LAB — APTT: aPTT: 27 seconds (ref 24–36)

## 2023-05-21 LAB — LIPASE, BLOOD: Lipase: 30 U/L (ref 11–51)

## 2023-05-21 MED ORDER — PROCHLORPERAZINE EDISYLATE 10 MG/2ML IJ SOLN
10.0000 mg | Freq: Once | INTRAMUSCULAR | Status: DC
  Filled 2023-05-21: qty 2

## 2023-05-21 MED ORDER — DIPHENHYDRAMINE HCL 50 MG/ML IJ SOLN
25.0000 mg | Freq: Once | INTRAMUSCULAR | Status: DC
  Filled 2023-05-21: qty 1

## 2023-05-21 MED ORDER — SODIUM CHLORIDE 0.9 % IV BOLUS
1000.0000 mL | Freq: Once | INTRAVENOUS | Status: AC
  Administered 2023-05-21: 1000 mL via INTRAVENOUS

## 2023-05-21 MED ORDER — ONDANSETRON HCL 4 MG/2ML IJ SOLN
4.0000 mg | Freq: Once | INTRAMUSCULAR | Status: AC
  Administered 2023-05-21: 4 mg via INTRAVENOUS
  Filled 2023-05-21: qty 2

## 2023-05-21 MED ORDER — KETOROLAC TROMETHAMINE 30 MG/ML IJ SOLN
15.0000 mg | Freq: Once | INTRAMUSCULAR | Status: AC
  Administered 2023-05-21: 15 mg via INTRAVENOUS
  Filled 2023-05-21: qty 1

## 2023-05-21 NOTE — ED Triage Notes (Signed)
Pt to ED for being at [employer] today, getting hot and sweaty, nauseous and feeling like going to pass out. Also reports headache. Also reports right arm tingling that started yesterday. Also reports generalized fatigue.

## 2023-05-21 NOTE — Discharge Instructions (Signed)
You were seen in the emergency department today for your headache and tingling.  Your labs and head CT were reassuring.  Follow-up with your outpatient team for further evaluation.  Return to the ER for new or worsening symptoms.

## 2023-05-21 NOTE — ED Provider Notes (Signed)
[facility] Provider Note    Event Date/Time   First MD Initiated Contact with Patient 05/21/23 1241     (approximate)   History   multiple complaints   HPI  Cindy Hays is a 42 y.o. female with history of iron deficiency anemia, headaches presenting to the emergency department for evaluation of headache and tingling.  Earlier today, patient had onset of headache, not different in character from prior.  She does report feeling nauseous and lightheaded with this.  She reports ongoing issues with right arm tingling. Today noticed some tingling in her left pinky and some of her left toes.  No numbness.  No foot weakness.  No vision changes.  Currently reports some ongoing paresthesias.  Longstanding history of headaches for which patient has tried multiple medications, nerve blocks.  Physical Exam   Triage Vital Signs: ED Triage Vitals  Enc Vitals Group     BP 05/21/23 1106 128/82     Pulse Rate 05/21/23 1107 (!) 116     Resp 05/21/23 1106 18     Temp 05/21/23 1106 98.7 F (37.1 C)     Temp src --      SpO2 05/21/23 1107 100 %     Weight 05/21/23 1106 186 lb (84.4 kg)     Height 05/21/23 1106 5\' 1"  (1.549 m)     Head Circumference --      Peak Flow --      Pain Score 05/21/23 1106 7     Pain Loc --      Pain Edu? --      Excl. in GC? --     Most recent vital signs: Vitals:   05/21/23 1107 05/21/23 1302  BP:  113/71  Pulse: (!) 116 95  Resp:  (!) 21  Temp:    SpO2: 100% 100%     General: Awake, interactive  CV:  Regular rate, good peripheral perfusion.  Resp:  Lungs clear, unlabored respirations.  Abd:  Soft, nondistended.  Neuro:  Alert and oriented, normal extraocular movements, symmetric facial movement, sensation intact and symmetric over bilateral upper and lower extremities with 5 out of 5 strength.  Normal finger-to-nose testing.  Normal extraocular movements.  Normal visual fields.   ED Results / Procedures / Treatments    Labs (all labs ordered are listed, but only abnormal results are displayed) Labs Reviewed  CBC - Abnormal; Notable for the following components:      Result Value   Hemoglobin 11.9 (*)    MCH 25.9 (*)    RDW 16.8 (*)    nRBC 0.4 (*)    All other components within normal limits  COMPREHENSIVE METABOLIC PANEL - Abnormal; Notable for the following components:   Sodium 134 (*)    Potassium 3.4 (*)    CO2 20 (*)    Glucose, Bld 107 (*)    AST 46 (*)    ALT 48 (*)    All other components within normal limits  PROTIME-INR  APTT  DIFFERENTIAL  ETHANOL  LIPASE, BLOOD  PREGNANCY, URINE     EKG EKG independently reviewed interpreted by myself (ER attending) demonstrates:  EKG demonstrates sinus tachycardia at a rate of 112, PR 178, QRS 72, QTc 431, no acute ST changes  RADIOLOGY Imaging independently reviewed and interpreted by myself demonstrates:  Head CT without acute bleed   PROCEDURES:  Critical Care performed: No  Procedures   MEDICATIONS ORDERED IN ED: Medications  diphenhydrAMINE (BENADRYL) injection 25  mg (25 mg Intravenous Not Given 05/21/23 1344)  prochlorperazine (COMPAZINE) injection 10 mg (10 mg Intravenous Not Given 05/21/23 1342)  ondansetron (ZOFRAN) injection 4 mg (4 mg Intravenous Given 05/21/23 1338)  sodium chloride 0.9 % bolus 1,000 mL (1,000 mLs Intravenous New Bag/Given 05/21/23 1345)  ketorolac (TORADOL) 30 MG/ML injection 15 mg (15 mg Intravenous Given 05/21/23 1341)     IMPRESSION / MDM / ASSESSMENT AND PLAN / ED COURSE  I reviewed the triage vital signs and the nursing notes.  Differential diagnosis includes, but is not limited to, complex migraine, anemia, electrolyte abnormality, stress mediated response, much lower suspicion acute intracranial process such as intracranial bleed, CVA  Patient's presentation is most consistent with acute presentation with potential threat to life or bodily function.  42 year old female presenting to the  emergency department for evaluation of headache with associated nausea and paresthesias.  Lab work with stable anemia, otherwise without critical derangements.  Head CT from triage without acute abnormality.  EKG without significant abnormality.  Her neurologic exam is reassuring.  Report subjective paresthesias, but sensation is normal to light touch.  Description of symptoms is not consistent with acute CVA.  Consideration for complex migraine.  Patient was treated with Zofran, Toradol, and IV fluids.  On reevaluation she reports improvement in her headache as well as her paresthesias.  She is comfortable with discharge home.  Do think this is reasonable.  Strict return precautions provided.    FINAL CLINICAL IMPRESSION(S) / ED DIAGNOSES   Final diagnoses:  Acute nonintractable headache, unspecified headache type  Paresthesias     Rx / DC Orders   ED Discharge Orders     None        Note:  This document was prepared using Dragon voice recognition software and may include unintentional dictation errors.   Trinna Post, MD 05/21/23 [phone]

## 2023-05-21 NOTE — ED Notes (Signed)
Pt in via POV due to tingling/numbness to the right side of face & arm. Pt also reports being at [employer] today when she became very hot and sweaty, went home and had x1 bm of diarrhea.

## 2023-05-21 NOTE — ED Notes (Signed)
Removed IV from pt.

## 2023-05-22 ENCOUNTER — Other Ambulatory Visit: Payer: Self-pay

## 2023-05-22 ENCOUNTER — Encounter: Payer: Self-pay | Admitting: Sports Medicine

## 2023-05-22 ENCOUNTER — Inpatient Hospital Stay: Payer: 59

## 2023-05-22 DIAGNOSIS — D509 Iron deficiency anemia, unspecified: Secondary | ICD-10-CM

## 2023-05-23 ENCOUNTER — Ambulatory Visit
Admission: RE | Admit: 2023-05-23 | Discharge: 2023-05-23 | Disposition: A | Discharge status: Home / Self Care | Payer: 59 | Source: Ambulatory Visit | Attending: Sports Medicine | Admitting: Sports Medicine

## 2023-05-23 DIAGNOSIS — M948X8 Other specified disorders of cartilage, other site: Secondary | ICD-10-CM | POA: Diagnosis not present

## 2023-05-23 DIAGNOSIS — M25461 Effusion, right knee: Secondary | ICD-10-CM | POA: Diagnosis not present

## 2023-05-23 DIAGNOSIS — M25561 Pain in right knee: Secondary | ICD-10-CM | POA: Diagnosis not present

## 2023-05-23 DIAGNOSIS — S83014A Lateral dislocation of right patella, initial encounter: Secondary | ICD-10-CM

## 2023-05-25 ENCOUNTER — Other Ambulatory Visit: Payer: 59

## 2023-05-26 ENCOUNTER — Other Ambulatory Visit: Payer: Self-pay

## 2023-05-26 ENCOUNTER — Emergency Department
Admission: EM | Admit: 2023-05-26 | Discharge: 2023-05-26 | Disposition: A | Discharge status: Home / Self Care | Payer: 59 | Attending: Emergency Medicine | Admitting: Emergency Medicine

## 2023-05-26 DIAGNOSIS — F419 Anxiety disorder, unspecified: Secondary | ICD-10-CM | POA: Diagnosis not present

## 2023-05-26 DIAGNOSIS — R002 Palpitations: Secondary | ICD-10-CM | POA: Insufficient documentation

## 2023-05-26 DIAGNOSIS — R6889 Other general symptoms and signs: Secondary | ICD-10-CM | POA: Diagnosis not present

## 2023-05-26 DIAGNOSIS — I10 Essential (primary) hypertension: Secondary | ICD-10-CM | POA: Insufficient documentation

## 2023-05-26 DIAGNOSIS — R531 Weakness: Secondary | ICD-10-CM | POA: Diagnosis present

## 2023-05-26 DIAGNOSIS — F411 Generalized anxiety disorder: Secondary | ICD-10-CM | POA: Diagnosis not present

## 2023-05-26 DIAGNOSIS — R5383 Other fatigue: Secondary | ICD-10-CM | POA: Diagnosis not present

## 2023-05-26 DIAGNOSIS — R55 Syncope and collapse: Secondary | ICD-10-CM | POA: Diagnosis not present

## 2023-05-26 DIAGNOSIS — R110 Nausea: Secondary | ICD-10-CM | POA: Diagnosis not present

## 2023-05-26 LAB — URINALYSIS, ROUTINE W REFLEX MICROSCOPIC
Bilirubin Urine: NEGATIVE
Glucose, UA: NEGATIVE mg/dL
Hgb urine dipstick: NEGATIVE
Ketones, ur: NEGATIVE mg/dL
Leukocytes,Ua: NEGATIVE
Nitrite: NEGATIVE
Protein, ur: NEGATIVE mg/dL
Specific Gravity, Urine: 1.001 — ABNORMAL LOW (ref 1.005–1.030)
pH: 7 (ref 5.0–8.0)

## 2023-05-26 LAB — POC URINE PREG, ED: Preg Test, Ur: NEGATIVE

## 2023-05-26 LAB — COMPREHENSIVE METABOLIC PANEL
ALT: 43 U/L (ref 0–44)
AST: 38 U/L (ref 15–41)
Albumin: 3.9 g/dL (ref 3.5–5.0)
Alkaline Phosphatase: 50 U/L (ref 38–126)
Anion gap: 9 (ref 5–15)
BUN: 7 mg/dL (ref 6–20)
CO2: 22 mmol/L (ref 22–32)
Calcium: 9.1 mg/dL (ref 8.9–10.3)
Chloride: 106 mmol/L (ref 98–111)
Creatinine, Ser: 0.57 mg/dL (ref 0.44–1.00)
GFR, Estimated: >60 mL/min (ref >60)
Glucose, Bld: 100 mg/dL — ABNORMAL HIGH (ref 70–99)
Potassium: 3.6 mmol/L (ref 3.5–5.1)
Sodium: 137 mmol/L (ref 135–145)
Total Bilirubin: 0.5 mg/dL (ref 0.3–1.2)
Total Protein: 7.6 g/dL (ref 6.5–8.1)

## 2023-05-26 LAB — CBC
HCT: 37.3 % (ref 36.0–46.0)
Hemoglobin: 11.7 g/dL — ABNORMAL LOW (ref 12.0–15.0)
MCH: 26.5 pg (ref 26.0–34.0)
MCHC: 31.4 g/dL (ref 30.0–36.0)
MCV: 84.4 fL (ref 80.0–100.0)
Platelets: 334 10*3/uL (ref 150–400)
RBC: 4.42 MIL/uL (ref 3.87–5.11)
RDW: 16.7 % — ABNORMAL HIGH (ref 11.5–15.5)
WBC: 5.8 10*3/uL (ref 4.0–10.5)
nRBC: 0 % (ref 0.0–0.2)

## 2023-05-26 LAB — LIPASE, BLOOD: Lipase: 34 U/L (ref 11–51)

## 2023-05-26 MED ORDER — BUSPIRONE HCL 5 MG PO TABS
5.0000 mg | ORAL_TABLET | Freq: Two times a day (BID) | ORAL | 2 refills | Status: DC
  Filled 2023-05-26 (×2): qty 60, 30d supply, fill #0

## 2023-05-26 MED ORDER — ESCITALOPRAM OXALATE 10 MG PO TABS
10.0000 mg | ORAL_TABLET | Freq: Every day | ORAL | 2 refills | Status: DC
  Filled 2023-05-26 (×2): qty 30, 30d supply, fill #0
  Filled 2023-07-15 (×2): qty 30, 30d supply, fill #1
  Filled 2023-09-03: qty 30, 30d supply, fill #2

## 2023-05-26 NOTE — ED Notes (Signed)
See triage notes. Patient was at work when she started feeling "not right"

## 2023-05-26 NOTE — ED Provider Notes (Signed)
[facility] Provider Note    Event Date/Time   First MD Initiated Contact with Patient 05/26/23 1138     (approximate)   History   Chief Complaint Dehydration and Weakness   HPI  Cindy Hays is a 42 y.o. female with past medical history of hypertension and iron deficiency anemia who presents to the ED complaining of weakness.  Patient reports that she was at work earlier today when she began to feel dizzy and lightheaded along with some generalized weakness.  She felt like her heart was racing and she might pass out, but she denies any pain in her chest or difficulty breathing.  Symptoms eventually resolved and she reports feeling better by the time she arrived to the ED.  She does report some ongoing fatigue.  She states she has had similar episodes in the past, does state that she has been under significant stress at work.     Physical Exam   Triage Vital Signs: ED Triage Vitals  Enc Vitals Group     BP 05/26/23 1009 (!) 131/90     Pulse Rate 05/26/23 1009 (!) 119     Resp 05/26/23 1009 18     Temp 05/26/23 1009 98.4 F (36.9 C)     Temp Source 05/26/23 1009 Oral     SpO2 05/26/23 1009 100 %     Weight 05/26/23 1010 186 lb 1.1 oz (84.4 kg)     Height 05/26/23 1010 5\' 1"  (1.549 m)     Head Circumference --      Peak Flow --      Pain Score 05/26/23 1010 3     Pain Loc --      Pain Edu? --      Excl. in GC? --     Most recent vital signs: Vitals:   05/26/23 1009  BP: (!) 131/90  Pulse: (!) 119  Resp: 18  Temp: 98.4 F (36.9 C)  SpO2: 100%    Constitutional: Alert and oriented. Eyes: Conjunctivae are normal. Head: Atraumatic. Nose: No congestion/rhinnorhea. Mouth/Throat: Mucous membranes are moist.  Cardiovascular: Normal rate, regular rhythm. Grossly normal heart sounds.  2+ radial pulses bilaterally. Respiratory: Normal respiratory effort.  No retractions. Lungs CTAB. Gastrointestinal: Soft and nontender. No  distention. Musculoskeletal: No lower extremity tenderness nor edema.  Neurologic:  Normal speech and language. No gross focal neurologic deficits are appreciated.    ED Results / Procedures / Treatments   Labs (all labs ordered are listed, but only abnormal results are displayed) Labs Reviewed  COMPREHENSIVE METABOLIC PANEL - Abnormal; Notable for the following components:      Result Value   Glucose, Bld 100 (*)    All other components within normal limits  CBC - Abnormal; Notable for the following components:   Hemoglobin 11.7 (*)    RDW 16.7 (*)    All other components within normal limits  URINALYSIS, ROUTINE W REFLEX MICROSCOPIC - Abnormal; Notable for the following components:   Color, Urine COLORLESS (*)    APPearance CLEAR (*)    Specific Gravity, Urine 1.001 (*)    All other components within normal limits  LIPASE, BLOOD  POC URINE PREG, ED     EKG  ED ECG REPORT I, Chesley Noon, the attending physician, personally viewed and interpreted this ECG.   Date: 05/26/2023  EKG Time: 12:20  Rate: 88  Rhythm: normal sinus rhythm  Axis: Normal  Intervals:none  ST&T Change: None  PROCEDURES:  Critical Care  performed: No  Procedures   MEDICATIONS ORDERED IN ED: Medications - No data to display   IMPRESSION / MDM / ASSESSMENT AND PLAN / ED COURSE  I reviewed the triage vital signs and the nursing notes.                              42 y.o. female with past medical history of hypertension and iron deficiency anemia who presents to the ED complaining of weakness and fatigue with episode of palpitations and near syncope.  Patient's presentation is most consistent with acute presentation with potential threat to life or bodily function.  Differential diagnosis includes, but is not limited to, arrhythmia, ACS, anemia, electrode abnormality, AKI, anxiety.  Patient nontoxic-appearing and in no acute distress, vital signs remarkable for tachycardia but  otherwise reassuring.  EKG shows normal sinus rhythm, prior tachycardia resolved without intervention and patient with heart rate in the 80s on cardiac monitor.  Pregnancy testing is negative and urinalysis shows no signs of infection.  Labs are reassuring with no significant anemia, leukocytosis, tract abnormality, or AKI.  Low suspicion for cardiac etiology for her episodes, patient does describe significant stress and anxiety prior to the episode.  Symptoms have resolved here in the ED and she is appropriate for discharge home with PCP follow-up, she was counseled to return to the ED for new or worsening symptoms.  Patient agrees with plan.      FINAL CLINICAL IMPRESSION(S) / ED DIAGNOSES   Final diagnoses:  Palpitations  Near syncope  Anxiety     Rx / DC Orders   ED Discharge Orders     None        Note:  This document was prepared using Dragon voice recognition software and may include unintentional dictation errors.   Chesley Noon, MD 05/26/23 1319

## 2023-05-26 NOTE — ED Triage Notes (Signed)
Pt here with dehydration and weakness. Pt states this has been going on for a few days and was seen here recently for same. Pt denies pain. Pt states a little nausea but denies vomiting and diarrhea. Pt is on a new medication that increases iron, Floradix and a stool softener since last Tues.

## 2023-05-29 ENCOUNTER — Ambulatory Visit: Payer: 59 | Admitting: Physician Assistant

## 2023-05-29 ENCOUNTER — Other Ambulatory Visit (HOSPITAL_COMMUNITY): Payer: Self-pay

## 2023-05-29 ENCOUNTER — Other Ambulatory Visit: Payer: Self-pay

## 2023-06-01 DIAGNOSIS — R5383 Other fatigue: Secondary | ICD-10-CM | POA: Diagnosis not present

## 2023-06-11 ENCOUNTER — Other Ambulatory Visit: Payer: Self-pay

## 2023-06-11 ENCOUNTER — Emergency Department
Admission: EM | Admit: 2023-06-11 | Discharge: 2023-06-11 | Disposition: A | Discharge status: Home / Self Care | Payer: 59 | Attending: Emergency Medicine | Admitting: Emergency Medicine

## 2023-06-11 ENCOUNTER — Emergency Department: Payer: 59

## 2023-06-11 DIAGNOSIS — R1110 Vomiting, unspecified: Secondary | ICD-10-CM | POA: Insufficient documentation

## 2023-06-11 DIAGNOSIS — R0789 Other chest pain: Secondary | ICD-10-CM | POA: Diagnosis not present

## 2023-06-11 DIAGNOSIS — I10 Essential (primary) hypertension: Secondary | ICD-10-CM | POA: Diagnosis not present

## 2023-06-11 DIAGNOSIS — R079 Chest pain, unspecified: Secondary | ICD-10-CM | POA: Diagnosis not present

## 2023-06-11 LAB — CBC
HCT: 33.4 % — ABNORMAL LOW (ref 36.0–46.0)
Hemoglobin: 10.8 g/dL — ABNORMAL LOW (ref 12.0–15.0)
MCH: 26.7 pg (ref 26.0–34.0)
MCHC: 32.3 g/dL (ref 30.0–36.0)
MCV: 82.5 fL (ref 80.0–100.0)
Platelets: 286 10*3/uL (ref 150–400)
RBC: 4.05 MIL/uL (ref 3.87–5.11)
RDW: 15.9 % — ABNORMAL HIGH (ref 11.5–15.5)
WBC: 4.9 10*3/uL (ref 4.0–10.5)
nRBC: 0 % (ref 0.0–0.2)

## 2023-06-11 LAB — BASIC METABOLIC PANEL
Anion gap: 10 (ref 5–15)
BUN: 9 mg/dL (ref 6–20)
CO2: 22 mmol/L (ref 22–32)
Calcium: 8.8 mg/dL — ABNORMAL LOW (ref 8.9–10.3)
Chloride: 104 mmol/L (ref 98–111)
Creatinine, Ser: 0.61 mg/dL (ref 0.44–1.00)
GFR, Estimated: >60 mL/min (ref >60)
Glucose, Bld: 88 mg/dL (ref 70–99)
Potassium: 3.5 mmol/L (ref 3.5–5.1)
Sodium: 135 mmol/L (ref 135–145)

## 2023-06-11 LAB — TROPONIN I (HIGH SENSITIVITY): Troponin I (High Sensitivity): <2 ng/L (ref <18)

## 2023-06-11 MED ORDER — IBUPROFEN 600 MG PO TABS
600.0000 mg | ORAL_TABLET | Freq: Three times a day (TID) | ORAL | 0 refills | Status: AC | PRN
  Filled 2023-06-11: qty 20, 7d supply, fill #0

## 2023-06-11 NOTE — Discharge Instructions (Addendum)
Please use ibuprofen (Motrin) up to 800 mg every 8 hours, naproxen (Naprosyn) up to 500 mg every 12 hours, and/or acetaminophen (Tylenol) up to 4 g/day for any continued pain.  Please do not use this medication regimen for longer than 7 days

## 2023-06-11 NOTE — ED Provider Notes (Signed)
[facility] Provider Note   Event Date/Time   First MD Initiated Contact with Patient 06/11/23 1153     (approximate) History  Chest Pain  HPI Cindy Hays is a 42 y.o. female with a past medical history of migraines and hypertension who presents complaining of of left-sided parasternal chest pain that is worse with palpation.  Patient states that it is accompanying shortness of breath and she is concerned may be cardiac in origin.  Patient states that she woke up with this pain this morning that she describes as a burning pain that does not radiate.  Patient states that she thought it may have been gas or took Pepto-Bismol.  Patient endorses associated chills and vomiting. ROS: Patient currently denies any vision changes, tinnitus, difficulty speaking, facial droop, sore throat, abdominal pain, nausea/vomiting/diarrhea, dysuria, or weakness/numbness/paresthesias in any extremity   Physical Exam  Triage Vital Signs: ED Triage Vitals  Encounter Vitals Group     BP 06/11/23 1133 126/87     Systolic BP Percentile --      Diastolic BP Percentile --      Pulse Rate 06/11/23 1133 79     Resp 06/11/23 1133 18     Temp 06/11/23 1133 98.5 F (36.9 C)     Temp Source 06/11/23 1133 Oral     SpO2 06/11/23 1133 100 %     Weight 06/11/23 1134 186 lb (84.4 kg)     Height 06/11/23 1134 5\' 1"  (1.549 m)     Head Circumference --      Peak Flow --      Pain Score 06/11/23 1133 6     Pain Loc --      Pain Education --      Exclude from Growth Chart --    Most recent vital signs: Vitals:   06/11/23 1133 06/11/23 1307  BP: 126/87 120/80  Pulse: 79 70  Resp: 18 18  Temp: 98.5 F (36.9 C)   SpO2: 100% 100%   General: Awake, oriented x4. CV:  Good peripheral perfusion.  No MGR Resp:  Normal effort.  Clear to auscultation bilaterally Abd:  No distention.  Other:  Young adult obese African-American female laying in bed in no acute distress.  Tenderness to palpation  over left aspect of parasternal region ED Results / Procedures / Treatments  Labs (all labs ordered are listed, but only abnormal results are displayed) Labs Reviewed  BASIC METABOLIC PANEL - Abnormal; Notable for the following components:      Result Value   Calcium 8.8 (*)    All other components within normal limits  CBC - Abnormal; Notable for the following components:   Hemoglobin 10.8 (*)    HCT 33.4 (*)    RDW 15.9 (*)    All other components within normal limits  TROPONIN I (HIGH SENSITIVITY)  TROPONIN I (HIGH SENSITIVITY)   EKG ED ECG REPORT I, Merwyn Katos, the attending physician, personally viewed and interpreted this ECG. Date: 06/11/2023 EKG Time: 1128 Rate: 85 Rhythm: normal sinus rhythm QRS Axis: normal Intervals: normal ST/T Wave abnormalities: normal Narrative Interpretation: no evidence of acute ischemia RADIOLOGY ED MD interpretation: 2 view chest x-ray interpreted by me shows no evidence of acute abnormalities including no pneumonia, pneumothorax, or widened mediastinum -Agree with radiology assessment Official radiology report(s): DG Chest 2 View  Result Date: 06/11/2023 CLINICAL DATA:  Chest pain. EXAM: CHEST - 2 VIEW COMPARISON:  04/15/2023. FINDINGS: Clear lungs. Normal heart size and  mediastinal contours. No pleural effusion or pneumothorax. Visualized bones and upper abdomen are unremarkable. IMPRESSION: No evidence of acute cardiopulmonary disease. Electronically Signed   By: Orvan Falconer M.D.   On: 06/11/2023 12:46   PROCEDURES: Critical Care performed: No Procedures MEDICATIONS ORDERED IN ED: Medications - No data to display IMPRESSION / MDM / ASSESSMENT AND PLAN / ED COURSE  I reviewed the triage vital signs and the nursing notes.                             The patient is on the cardiac monitor to evaluate for evidence of arrhythmia and/or significant heart rate changes. Patient's presentation is most consistent with acute presentation  with potential threat to life or bodily function. This patient presents with atypical chest pain, most likely secondary to musculoskeletal injury. Differential diagnosis includes rib fracture, costochondritis, sternal fracture. Low suspicion for ACS, acute PE (PERC negative), pericarditis / myocarditis, thoracic aortic dissection, pneumothorax, pneumonia or other acute infectious process. Presentation not consistent with other acute, emergent causes of chest pain at this time. No indication for cardiac enzyme testing. Plan to order CXR to evaluate for acute cardiopulmonary causes.  Plan: EKG, CXR, pain control  Dispo: Discharge home with home care   FINAL CLINICAL IMPRESSION(S) / ED DIAGNOSES   Final diagnoses:  Atypical chest pain   Rx / DC Orders   ED Discharge Orders          Ordered    Ambulatory referral to Cardiology       Comments: If you have not heard from the Cardiology office within the next 72 hours please call [phone].   06/11/23 1251           Note:  This document was prepared using Dragon voice recognition software and may include unintentional dictation errors.   Merwyn Katos, MD 06/11/23 1328

## 2023-06-11 NOTE — ED Notes (Signed)
See triage note  Presents with some chest discomfort   States she developed sxs' while at work  Has been under a lot of stress at work

## 2023-06-11 NOTE — ED Triage Notes (Signed)
Pt presents to the ED with palpitations, SHOB and chest tightness that started last night around 9pm. Pt states when she starts to move around the [facility] gets worse with movement. States that she thought it could have been gas so she took a pepto. Reports some chills and vomiting.

## 2023-06-16 ENCOUNTER — Other Ambulatory Visit: Payer: Self-pay

## 2023-06-18 ENCOUNTER — Encounter: Payer: Self-pay | Admitting: Gastroenterology

## 2023-06-19 ENCOUNTER — Other Ambulatory Visit: Payer: Self-pay

## 2023-06-19 MED ORDER — PEG 3350-KCL-NABCB-NACL-NASULF 236 G PO SOLR
4000.0000 mL | Freq: Once | ORAL | 0 refills | Status: AC
  Filled 2023-06-19: qty 4000, 1d supply, fill #0

## 2023-06-19 NOTE — Addendum Note (Signed)
Addended by: Roena Malady on: 06/19/2023 11:17 AM   Modules accepted: Orders

## 2023-06-22 ENCOUNTER — Other Ambulatory Visit: Payer: Self-pay

## 2023-06-22 DIAGNOSIS — R899 Unspecified abnormal finding in specimens from other organs, systems and tissues: Secondary | ICD-10-CM | POA: Diagnosis not present

## 2023-06-22 MED ORDER — PEG 3350-KCL-NABCB-NACL-NASULF 236 G PO SOLR
4000.0000 mL | Freq: Once | ORAL | 0 refills | Status: AC
  Filled 2023-06-22: qty 4000, 1d supply, fill #0

## 2023-06-22 MED ORDER — PEG 3350-KCL-NA BICARB-NACL 420 G PO SOLR
4000.0000 mL | Freq: Once | ORAL | 0 refills | Status: AC
  Filled 2023-06-22: qty 4000, 1d supply, fill #0

## 2023-06-22 NOTE — Addendum Note (Signed)
Addended by: Roena Malady on: 06/22/2023 12:29 PM   Modules accepted: Orders

## 2023-06-22 NOTE — Addendum Note (Signed)
Addended by: Roena Malady on: 06/22/2023 12:25 PM   Modules accepted: Orders

## 2023-06-24 ENCOUNTER — Other Ambulatory Visit: Payer: Self-pay

## 2023-06-25 ENCOUNTER — Ambulatory Visit: Payer: 59 | Admitting: Anesthesiology

## 2023-06-25 ENCOUNTER — Ambulatory Visit
Admission: RE | Admit: 2023-06-25 | Discharge: 2023-06-25 | Disposition: A | Discharge status: Home / Self Care | Payer: 59 | Attending: Gastroenterology | Admitting: Gastroenterology

## 2023-06-25 ENCOUNTER — Telehealth: Payer: Self-pay

## 2023-06-25 ENCOUNTER — Other Ambulatory Visit: Payer: Self-pay

## 2023-06-25 ENCOUNTER — Encounter: Payer: Self-pay | Admitting: Gastroenterology

## 2023-06-25 ENCOUNTER — Encounter
Admission: RE | Disposition: A | Discharge status: Home / Self Care | Payer: Self-pay | Source: Home / Self Care | Attending: Gastroenterology

## 2023-06-25 DIAGNOSIS — I10 Essential (primary) hypertension: Secondary | ICD-10-CM | POA: Diagnosis not present

## 2023-06-25 DIAGNOSIS — D500 Iron deficiency anemia secondary to blood loss (chronic): Secondary | ICD-10-CM

## 2023-06-25 DIAGNOSIS — K635 Polyp of colon: Secondary | ICD-10-CM | POA: Insufficient documentation

## 2023-06-25 DIAGNOSIS — K2980 Duodenitis without bleeding: Secondary | ICD-10-CM | POA: Insufficient documentation

## 2023-06-25 DIAGNOSIS — D509 Iron deficiency anemia, unspecified: Secondary | ICD-10-CM | POA: Insufficient documentation

## 2023-06-25 DIAGNOSIS — K641 Second degree hemorrhoids: Secondary | ICD-10-CM | POA: Diagnosis not present

## 2023-06-25 HISTORY — DX: Unspecified osteoarthritis, unspecified site: M19.90

## 2023-06-25 HISTORY — PX: POLYPECTOMY: SHX5525

## 2023-06-25 HISTORY — PX: COLONOSCOPY WITH PROPOFOL: SHX5780

## 2023-06-25 HISTORY — PX: ESOPHAGOGASTRODUODENOSCOPY (EGD) WITH PROPOFOL: SHX5813

## 2023-06-25 SURGERY — COLONOSCOPY WITH PROPOFOL
Anesthesia: General | Site: Throat

## 2023-06-25 MED ORDER — LIDOCAINE HCL (CARDIAC) PF 100 MG/5ML IV SOSY
PREFILLED_SYRINGE | INTRAVENOUS | Status: DC | PRN
  Administered 2023-06-25: 100 mg via INTRAVENOUS

## 2023-06-25 MED ORDER — LACTATED RINGERS IV SOLN: INTRAVENOUS | Status: DC

## 2023-06-25 MED ORDER — PROPOFOL 10 MG/ML IV BOLUS
INTRAVENOUS | Status: DC | PRN
  Administered 2023-06-25 (×3): 20 mg via INTRAVENOUS
  Administered 2023-06-25: 30 mg via INTRAVENOUS
  Administered 2023-06-25: 20 mg via INTRAVENOUS
  Administered 2023-06-25 (×3): 30 mg via INTRAVENOUS
  Administered 2023-06-25: 90 mg via INTRAVENOUS
  Administered 2023-06-25: 10 mg via INTRAVENOUS

## 2023-06-25 MED ORDER — STERILE WATER FOR IRRIGATION IR SOLN
Status: DC | PRN
  Administered 2023-06-25: 120 mL

## 2023-06-25 MED ORDER — STERILE WATER FOR IRRIGATION IR SOLN
Status: DC | PRN
  Administered 2023-06-25: 1

## 2023-06-25 SURGICAL SUPPLY — 36 items
BALLN DILATOR 12-15 8 (BALLOONS)
BALLN DILATOR 15-18 8 (BALLOONS)
BALLN DILATOR CRE 0-12 8 (BALLOONS)
BALLN DILATOR ESOPH 8 10 CRE (MISCELLANEOUS) IMPLANT
BALLOON DILATOR 12-15 8 (BALLOONS) IMPLANT
BALLOON DILATOR 15-18 8 (BALLOONS) IMPLANT
BALLOON DILATOR CRE 0-12 8 (BALLOONS) IMPLANT
BLOCK BITE 60FR ADLT L/F GRN (MISCELLANEOUS) ×3 IMPLANT
CLIP HMST 235XBRD CATH ROT (MISCELLANEOUS) IMPLANT
CLIP RESOLUTION 360 11X235 (MISCELLANEOUS)
ELECT REM PT RETURN 9FT ADLT (ELECTROSURGICAL)
ELECTRODE REM PT RTRN 9FT ADLT (ELECTROSURGICAL) IMPLANT
FCP ESCP3.2XJMB 240X2.8X (MISCELLANEOUS)
FORCEPS BIOP RAD 4 LRG CAP 4 (CUTTING FORCEPS) IMPLANT
FORCEPS BIOP RJ4 240 W/NDL (MISCELLANEOUS)
FORCEPS ESCP3.2XJMB 240X2.8X (MISCELLANEOUS) IMPLANT
GOWN CVR UNV OPN BCK APRN NK (MISCELLANEOUS) ×6 IMPLANT
GOWN ISOL THUMB LOOP REG UNIV (MISCELLANEOUS) ×6
INJECTOR VARIJECT VIN23 (MISCELLANEOUS) IMPLANT
KIT DEFENDO VALVE AND CONN (KITS) IMPLANT
KIT PRC NS LF DISP ENDO (KITS) ×3 IMPLANT
KIT PROCEDURE OLYMPUS (KITS) ×3
MANIFOLD NEPTUNE II (INSTRUMENTS) ×3 IMPLANT
MARKER SPOT ENDO TATTOO 5ML (MISCELLANEOUS) IMPLANT
PROBE APC STR FIRE (PROBE) IMPLANT
RETRIEVER NET PLAT FOOD (MISCELLANEOUS) IMPLANT
RETRIEVER NET ROTH 2.5X230 LF (MISCELLANEOUS) IMPLANT
SNARE COLD EXACTO (MISCELLANEOUS) IMPLANT
SNARE SHORT THROW 13M SML OVAL (MISCELLANEOUS) IMPLANT
SNARE SHORT THROW 30M LRG OVAL (MISCELLANEOUS) IMPLANT
SNARE SNG USE RND 15MM (INSTRUMENTS) IMPLANT
SYR INFLATION 60ML (SYRINGE) IMPLANT
TRAP ETRAP POLY (MISCELLANEOUS) IMPLANT
VARIJECT INJECTOR VIN23 (MISCELLANEOUS)
WATER STERILE IRR 250ML POUR (IV SOLUTION) ×3 IMPLANT
WIRE CRE 18-20MM 8CM F G (MISCELLANEOUS) IMPLANT

## 2023-06-25 NOTE — Telephone Encounter (Signed)
needs a capsule for anemia

## 2023-06-25 NOTE — Anesthesia Postprocedure Evaluation (Signed)
Anesthesia Post Note  Patient: Cindy Hays  Procedure(s) Performed: COLONOSCOPY WITH PROPOFOL (Rectum) ESOPHAGOGASTRODUODENOSCOPY (EGD) WITH PROPOFOL (Throat) POLYPECTOMY (Rectum)  Patient location during evaluation: PACU Anesthesia Type: General Level of consciousness: awake and alert Pain management: pain level controlled Vital Signs Assessment: post-procedure vital signs reviewed and stable Respiratory status: spontaneous breathing, nonlabored ventilation, respiratory function stable and patient connected to nasal cannula oxygen Cardiovascular status: blood pressure returned to baseline and stable Postop Assessment: no apparent nausea or vomiting Anesthetic complications: no   No notable events documented.   Last Vitals:  Vitals:   06/25/23 0945 06/25/23 0946  BP:  117/76  Pulse: 71 76  Resp: 15 18  Temp:    SpO2: 100% 100%    Last Pain:  Vitals:   06/25/23 0946  TempSrc:   PainSc: 0-No pain                 Louie Boston

## 2023-06-25 NOTE — Transfer of Care (Signed)
Immediate Anesthesia Transfer of Care Note  Patient: Cindy Hays  Procedure(s) Performed: COLONOSCOPY WITH PROPOFOL (Rectum) ESOPHAGOGASTRODUODENOSCOPY (EGD) WITH PROPOFOL (Throat) POLYPECTOMY (Rectum)  Patient Location: PACU  Anesthesia Type: General  Level of Consciousness: awake, alert  and patient cooperative  Airway and Oxygen Therapy: Patient Spontanous Breathing and Patient connected to supplemental oxygen  Post-op Assessment: Post-op Vital signs reviewed, Patient's Cardiovascular Status Stable, Respiratory Function Stable, Patent Airway and No signs of Nausea or vomiting  Post-op Vital Signs: Reviewed and stable  Complications: No notable events documented.

## 2023-06-25 NOTE — Anesthesia Preprocedure Evaluation (Signed)
Anesthesia Evaluation  Patient identified by MRN, date of birth, ID band Patient awake    Reviewed: Allergy & Precautions, NPO status , Patient's Chart, lab work & pertinent test results  History of Anesthesia Complications Negative for: history of anesthetic complications  Airway Mallampati: III  TM Distance: >3 FB Neck ROM: full    Dental no notable dental hx.    Pulmonary neg pulmonary ROS   Pulmonary exam normal        Cardiovascular hypertension, On Medications negative cardio ROS Normal cardiovascular exam     Neuro/Psych  Headaches  Neuromuscular disease  negative psych ROS   GI/Hepatic negative GI ROS, Neg liver ROS,,,  Endo/Other  negative endocrine ROS    Renal/GU negative Renal ROS  negative genitourinary   Musculoskeletal  (+) Arthritis ,    Abdominal   Peds  Hematology  (+) Blood dyscrasia, anemia   Anesthesia Other Findings Past Medical History: No date: Anemia No date: Arthritis No date: B12 deficiency 10/2022: Gallstones No date: History of gestational hypertension No date: Hypertension No date: Preterm labor No date: Uterine leiomyoma  Past Surgical History: 12/20/2021: ROBOTIC ASSISTED LAPAROSCOPIC HYSTERECTOMY AND  SALPINGECTOMY; Bilateral     Comment:  Procedure: XI ROBOTIC ASSISTED LAPAROSCOPIC HYSTERECTOMY              AND SALPINGECTOMY;  Surgeon: Christeen Douglas, MD;                Location: [facility] ORS;  Service: Gynecology;  Laterality:               Bilateral;  BMI    Body Mass Index: 33.82 kg/m      Reproductive/Obstetrics negative OB ROS                             Anesthesia Physical Anesthesia Plan  ASA: 2  Anesthesia Plan: General   Post-op Pain Management: Minimal or no pain anticipated   Induction: Intravenous  PONV Risk Score and Plan: 2 and Propofol infusion and TIVA  Airway Management Planned: Natural Airway and Nasal  Cannula  Additional Equipment:   Intra-op Plan:   Post-operative Plan:   Informed Consent: I have reviewed the patients History and Physical, chart, labs and discussed the procedure including the risks, benefits and alternatives for the proposed anesthesia with the patient or authorized representative who has indicated his/her understanding and acceptance.     Dental Advisory Given  Plan Discussed with: Anesthesiologist, CRNA and Surgeon  Anesthesia Plan Comments: (Patient consented for risks of anesthesia including but not limited to:  - adverse reactions to medications - risk of airway placement if required - damage to eyes, teeth, lips or other oral mucosa - nerve damage due to positioning  - sore throat or hoarseness - Damage to heart, brain, nerves, lungs, other parts of body or loss of life  Patient voiced understanding.)       Anesthesia Quick Evaluation

## 2023-06-25 NOTE — Op Note (Signed)
[facility] Patient Name: Cindy Hays Procedure Date: 06/25/2023 9:03 AM MRN: [id number] Account #: [id number] Date of Birth: [birth year] Admit Type: Outpatient Age: 42 Room: [facility] OR ROOM 01 Gender: Female Note Status: Finalized Instrument Name: [id number] Procedure:             Colonoscopy Indications:           Iron deficiency anemia Providers:             Midge Minium MD, MD Referring MD:          Enid Baas, MD (Referring MD) Medicines:             Propofol per Anesthesia Complications:         No immediate complications. Procedure:             Pre-Anesthesia Assessment:                        - Prior to the procedure, a History and Physical was                         performed, and patient medications and allergies were                         reviewed. The patient's tolerance of previous                         anesthesia was also reviewed. The risks and benefits                         of the procedure and the sedation options and risks                         were discussed with the patient. All questions were                         answered, and informed consent was obtained. Prior                         Anticoagulants: The patient has taken no anticoagulant                         or antiplatelet agents. ASA Grade Assessment: II - A                         patient with mild systemic disease. After reviewing                         the risks and benefits, the patient was deemed in                         satisfactory condition to undergo the procedure.                        After obtaining informed consent, the colonoscope was                         passed under direct vision. Throughout the procedure,  the patient's blood pressure, pulse, and oxygen                         saturations were monitored continuously. The                         Colonoscope was introduced through the anus and                          advanced to the the cecum, identified by appendiceal                         orifice and ileocecal valve. The colonoscopy was                         performed without difficulty. The patient tolerated                         the procedure well. The quality of the bowel                         preparation was excellent. Findings:      The perianal and digital rectal examinations were normal.      A 5 mm polyp was found in the descending colon. The polyp was sessile.       The polyp was removed with a cold snare. Resection and retrieval were       complete.      Non-bleeding internal hemorrhoids were found during retroflexion. The       hemorrhoids were Grade II (internal hemorrhoids that prolapse but reduce       spontaneously). Impression:            - One 5 mm polyp in the descending colon, removed with                         a cold snare. Resected and retrieved.                        - Non-bleeding internal hemorrhoids. Recommendation:        - Discharge patient to home.                        - Resume previous diet.                        - Continue present medications.                        - Await pathology results.                        - To visualize the small bowel, perform video capsule                         endoscopy. Procedure Code(s):     --- Professional ---                        [phone], Colonoscopy, flexible; with removal of  tumor(s), polyp(s), or other lesion(s) by snare                         technique Diagnosis Code(s):     --- Professional ---                        D50.9, Iron deficiency anemia, unspecified                        D12.4, Benign neoplasm of descending colon CPT copyright 2022 American Medical Association. All rights reserved. The codes documented in this report are preliminary and upon coder review may  be revised to meet current compliance requirements. Midge Minium MD, MD 06/25/2023 9:32:52 AM This report has  been signed electronically. Number of Addenda: 0 Note Initiated On: 06/25/2023 9:03 AM Scope Withdrawal Time: 0 hours 8 minutes 4 seconds  Total Procedure Duration: 0 hours 12 minutes 4 seconds  Estimated Blood Loss:  Estimated blood loss: none.      [facility]

## 2023-06-25 NOTE — H&P (Signed)
Midge Minium, MD [facility] [address] [place], [place] [address] [phone] Fax : [phone]  Primary Care Physician:  Enid Baas, MD Primary Gastroenterologist:  Dr. Servando Snare  Pre-Procedure History & Physical: HPI:  Cindy Hays is a 42 y.o. female is here for an endoscopy and colonoscopy.   Past Medical History:  Diagnosis Date   Anemia    Arthritis    B12 deficiency    Gallstones 10/2022   History of gestational hypertension    Hypertension    Preterm labor    Uterine leiomyoma     Past Surgical History:  Procedure Laterality Date   ROBOTIC ASSISTED LAPAROSCOPIC HYSTERECTOMY AND SALPINGECTOMY Bilateral 12/20/2021   Procedure: XI ROBOTIC ASSISTED LAPAROSCOPIC HYSTERECTOMY AND SALPINGECTOMY;  Surgeon: Christeen Douglas, MD;  Location: [facility] ORS;  Service: Gynecology;  Laterality: Bilateral;    Prior to Admission medications   Medication Sig Start Date End Date Taking? Authorizing Provider  albuterol (VENTOLIN HFA) 108 (90 Base) MCG/ACT inhaler Inhale 2 puffs into the lungs every 6 (six) hours as needed for wheezing or shortness of breath. 04/16/23  Yes Sharman Cheek, MD  cyclobenzaprine (FLEXERIL) 10 MG tablet Take 1 tablet (10 mg total) by mouth 2 (two) times daily between meals as needed  for muscle spasms for up to 10 days 04/23/23  Yes   Erenumab-aooe (AIMOVIG) 140 MG/ML SOAJ Inject 140 mg into the skin every 28 (twenty-eight) days. 12/24/22  Yes   escitalopram (LEXAPRO) 10 MG tablet Take 1 tablet (10 mg total) by mouth daily. Can start with 1/2 tablet daily for 1-2 weeks. 05/26/23  Yes   ferrous sulfate 220 (44 Fe) MG/5ML solution Take 5 mLs (220 mg total) by mouth 2 (two) times daily. 04/17/23  Yes   ondansetron (ZOFRAN) 4 MG tablet Take 1 tablet (4 mg total) by mouth daily as needed for nausea or vomiting. 11/08/22 11/08/23 Yes Pilar Jarvis, MD  ondansetron (ZOFRAN) 4 MG tablet Take 1 tablet (4 mg total) by mouth every 8 (eight) hours as needed.  04/16/23  Yes Sharman Cheek, MD  Plecanatide (TRULANCE) 3 MG TABS Take 1 tablet (3 mg total) by mouth daily. 05/14/23 05/08/24 Yes Celso Amy, PA-C  polyethylene glycol-electrolytes (NULYTELY) 420 g solution Take 4,000 mLs by mouth once for 1 dose. 06/22/23 06/25/23 Yes Midge Minium, MD  Rimegepant Sulfate (NURTEC) 75 MG TBDP Take 1 tablet (75 mg total) by mouth as directed at onset of migraine once a day as needed. 03/09/23  Yes   loratadine (CLARITIN) 10 MG tablet Take 10 mg by mouth daily. 02/03/20 05/13/20  [provider]  amoxicillin (AMOXIL) 500 MG capsule Take 1 capsule (500 mg total) by mouth 3 (three) times daily until gone. Patient not taking: Reported on 06/18/2023 05/08/23     Atogepant (QULIPTA PO) Take 1 tablet by mouth daily. Patient not taking: Reported on 06/18/2023    [provider]  busPIRone (BUSPAR) 5 MG tablet Take 1 tablet (5 mg total) by mouth 2 (two) times daily Patient not taking: Reported on 06/18/2023 05/26/23     butalbital-acetaminophen-caffeine (FIORICET) 50-325-40 MG tablet Take 2 tablets by mouth every 4 (four) hours as needed for pain Patient not taking: Reported on 06/18/2023 02/19/23   Alm Bustard, NP  dihydroergotamine (MIGRANAL) 4 MG/ML nasal spray Place into the nose. Patient not taking: Reported on 06/18/2023 03/11/23   [provider]  estradiol (ESTRACE) 0.1 MG/GM vaginal cream SMARTSIG:2 Gram(s) Vaginal Twice a Week Patient not taking: Reported on 06/18/2023  [provider]  estradiol (ESTRACE) 0.1 MG/GM vaginal cream Place 2 g vaginally 2 (two) times a week. Patient not taking: Reported on 06/18/2023 12/15/22     ferrous sulfate 220 (44 Fe) MG/5ML solution Take 220 mg by mouth 2 (two) times daily with a meal. Patient not taking: Reported on 06/18/2023    [provider]  HYDROcodone-acetaminophen (NORCO) 5-325 MG tablet Take 1 tablet by mouth every 6 (six) hours as needed for up to 6 doses for moderate pain. Patient not  taking: Reported on 06/18/2023 11/06/22   Sung Amabile, DO  hydrocortisone 2.5 % ointment Apply 1 Application topically 2 (two) times daily  for 7 days. Patient not taking: Reported on 06/18/2023 01/21/23     hydrOXYzine (ATARAX) 10 MG tablet Take 1 tablet (10 mg total) by mouth daily as needed for Anxiety (flight travel) for up to 10 days Patient not taking: Reported on 06/18/2023 04/29/23     lubiprostone (AMITIZA) 8 MCG capsule Take 1 capsule (8 mcg total) by mouth 2 (two) times daily for constipation. Take with food. Patient not taking: Reported on 06/18/2023 05/14/23   Celso Amy, PA-C  magic mouthwash (nystatin, lidocaine, diphenhydrAMINE, alum & mag hydroxide) suspension Swish and spit 5 mLs 4 (four) times daily as needed for mouth pain. Patient not taking: Reported on 06/18/2023 12/16/22   Valinda Hoar, NP  promethazine-dextromethorphan (PROMETHAZINE-DM) 6.25-15 MG/5ML syrup Take 5 mLs by mouth 4 (four) times daily as needed for cough. Patient not taking: Reported on 06/18/2023 12/19/22   Radford Pax, NP  linaclotide [facility]) 72 MCG capsule Take 1 capsule (72 mcg total) by mouth daily. 02/19/23 04/23/23    XULANE 150-35 MCG/24HR transdermal patch 1 patch once a week. 09/04/19 11/18/19  [provider]    Allergies as of 05/14/2023 - Review Complete 05/14/2023  Allergen Reaction Noted   Vicodin [hydrocodone-acetaminophen] Rash 09/17/2015    Family History  Problem Relation Age of Onset   Hypertension Mother    Alcohol abuse Mother    Liver disease Mother    Other Father        unknown medical history   Hypertension Maternal Grandmother    Diabetes Maternal Grandmother    Dementia Maternal Grandmother    Autoimmune disease Daughter     Social History   Socioeconomic History   Marital status: Single    Spouse name: Not on file   Number of children: 5   Years of education: Not on file   Highest education level: Not on file  Occupational History   Not on file  Tobacco Use    Smoking status: Never   Smokeless tobacco: Never  Vaping Use   Vaping status: Never Used  Substance and Sexual Activity   Alcohol use: Yes    Alcohol/week: 1.0 standard drink of alcohol    Types: 1 Glasses of wine per week    Comment: occ wine   Drug use: No   Sexual activity: Not Currently  Other Topics Concern   Not on file  Social History Narrative   Not on file   Social Determinants of Health   Financial Resource Strain: Low Risk  (04/24/2023)   Overall Financial Resource Strain (CARDIA)    Difficulty of Paying Living Expenses: Not very hard  Food Insecurity: No Food Insecurity (04/24/2023)   Hunger Vital Sign    Worried About Running Out of Food in the Last Year: Never true    Ran Out of Food in the Last Year:  Never true  Transportation Needs: No Transportation Needs (04/24/2023)   PRAPARE - Administrator, Civil Service (Medical): No    Lack of Transportation (Non-Medical): No  Physical Activity: Not on file  Stress: No Stress Concern Present (04/24/2023)   [employer] of Occupational Health - Occupational Stress Questionnaire    Feeling of Stress : Not at all  Social Connections: Not on file  Intimate Partner Violence: Not At Risk (04/24/2023)   Humiliation, Afraid, Rape, and Kick questionnaire    Fear of Current or Ex-Partner: No    Emotionally Abused: No    Physically Abused: No    Sexually Abused: No    Review of Systems: See HPI, otherwise negative ROS  Physical Exam: BP 114/78   Pulse 86   Temp (!) 97.2 F (36.2 C) (Temporal)   Resp 17   Ht 5\' 1"  (1.549 m)   Wt 83.6 kg   LMP 10/27/2021 (Exact Date) Comment: having depot shots  SpO2 98%   BMI 34.84 kg/m  General:   Alert,  pleasant and cooperative in NAD Head:  Normocephalic and atraumatic. Neck:  Supple; no masses or thyromegaly. Lungs:  Clear throughout to auscultation.    Heart:  Regular rate and rhythm. Abdomen:  Soft, nontender and nondistended. Normal bowel sounds, without  guarding, and without rebound.   Neurologic:  Alert and  oriented x4;  grossly normal neurologically.  Impression/Plan: Cindy Hays is here for an endoscopy and colonoscopy to be performed for low iron anemia  Risks, benefits, limitations, and alternatives regarding  endoscopy and colonoscopy have been reviewed with the patient.  Questions have been answered.  All parties agreeable.   Midge Minium, MD  06/25/2023, 8:59 AM

## 2023-06-25 NOTE — Op Note (Signed)
[facility] Patient Name: Cindy Hays Procedure Date: 06/25/2023 9:04 AM MRN: [id number] Account #: [id number] Date of Birth: 04/14/1981 Admit Type: Outpatient Age: 42 Room: [facility] OR ROOM 01 Gender: Female Note Status: Finalized Instrument Name: [id number] Procedure:             Upper GI endoscopy Indications:           Iron deficiency anemia Providers:             Midge Minium MD, MD Referring MD:          Enid Baas, MD (Referring MD) Medicines:             Propofol per Anesthesia Complications:         No immediate complications. Procedure:             Pre-Anesthesia Assessment:                        - Prior to the procedure, a History and Physical was                         performed, and patient medications and allergies were                         reviewed. The patient's tolerance of previous                         anesthesia was also reviewed. The risks and benefits                         of the procedure and the sedation options and risks                         were discussed with the patient. All questions were                         answered, and informed consent was obtained. Prior                         Anticoagulants: The patient has taken no anticoagulant                         or antiplatelet agents. ASA Grade Assessment: II - A                         patient with mild systemic disease. After reviewing                         the risks and benefits, the patient was deemed in                         satisfactory condition to undergo the procedure.                        After obtaining informed consent, the endoscope was                         passed under direct vision. Throughout the procedure,  the patient's blood pressure, pulse, and oxygen                         saturations were monitored continuously. The was                         introduced through the mouth, and advanced to the                          second part of duodenum. The upper GI endoscopy was                         accomplished without difficulty. The patient tolerated                         the procedure well. Findings:      The examined esophagus was normal.      The stomach was normal.      The examined duodenum was normal. Biopsies were taken with a cold       forceps for histology. Impression:            - Normal esophagus.                        - Normal stomach.                        - Normal examined duodenum. Biopsied. Recommendation:        - Discharge patient to home.                        - Resume previous diet.                        - Continue present medications.                        - Await pathology results.                        - Perform a colonoscopy. Procedure Code(s):     --- Professional ---                        [phone], Esophagogastroduodenoscopy, flexible,                         transoral; with biopsy, single or multiple Diagnosis Code(s):     --- Professional ---                        D50.9, Iron deficiency anemia, unspecified CPT copyright 2022 American Medical Association. All rights reserved. The codes documented in this report are preliminary and upon coder review may  be revised to meet current compliance requirements. Midge Minium MD, MD 06/25/2023 9:16:26 AM This report has been signed electronically. Number of Addenda: 0 Note Initiated On: 06/25/2023 9:04 AM Total Procedure Duration: 0 hours 3 minutes 8 seconds  Estimated Blood Loss:  Estimated blood loss: none.      [facility]

## 2023-06-26 ENCOUNTER — Other Ambulatory Visit: Payer: Self-pay

## 2023-07-06 ENCOUNTER — Encounter: Payer: Self-pay | Admitting: Gastroenterology

## 2023-07-07 ENCOUNTER — Other Ambulatory Visit: Payer: Self-pay

## 2023-07-08 ENCOUNTER — Ambulatory Visit: Payer: 59 | Admitting: Physician Assistant

## 2023-07-08 ENCOUNTER — Other Ambulatory Visit: Payer: Self-pay

## 2023-07-08 DIAGNOSIS — R42 Dizziness and giddiness: Secondary | ICD-10-CM | POA: Diagnosis not present

## 2023-07-08 DIAGNOSIS — M7918 Myalgia, other site: Secondary | ICD-10-CM | POA: Diagnosis not present

## 2023-07-08 DIAGNOSIS — M5481 Occipital neuralgia: Secondary | ICD-10-CM | POA: Diagnosis not present

## 2023-07-08 DIAGNOSIS — F419 Anxiety disorder, unspecified: Secondary | ICD-10-CM | POA: Diagnosis not present

## 2023-07-08 DIAGNOSIS — G43109 Migraine with aura, not intractable, without status migrainosus: Secondary | ICD-10-CM | POA: Diagnosis not present

## 2023-07-08 DIAGNOSIS — F32A Depression, unspecified: Secondary | ICD-10-CM | POA: Diagnosis not present

## 2023-07-08 MED ORDER — CYCLOBENZAPRINE HCL 10 MG PO TABS
10.0000 mg | ORAL_TABLET | Freq: Two times a day (BID) | ORAL | 0 refills | Status: DC | PRN
  Filled 2023-07-08: qty 20, 10d supply, fill #0

## 2023-07-08 MED ORDER — PREDNISONE 10 MG PO TABS
ORAL_TABLET | ORAL | 0 refills | Status: DC
  Filled 2023-07-08: qty 21, 6d supply, fill #0

## 2023-07-08 NOTE — Progress Notes (Deleted)
Celso Amy, PA-C [address]  [address]  [place], [place] [address]  Main: [phone]  Fax: [phone]   Primary Care Physician: Enid Baas, MD  Primary Gastroenterologist:  Celso Amy, PA-C / Dr. Midge Minium    CC: Follow-up iron deficiency anemia  HPI: Cindy Hays is a 42 y.o. female returns for 2 month followup of iron deficiency anemia.  Continues on oral iron and vitamin B12.  She has seen hematologist Dr. Cathie Hoops.  History of chronic IDA since 2016.  Previous hysterectomy for menorrhagia.  Cholecystectomy 10/2022.  History of chronic constipation.  Takes Trulance 3 mg once daily.  Colonoscopy 06/25/2023 by Dr. Servando Snare showed 1 small 5 mm hyperplastic polyp removed from descending colon, grade 2 internal hemorrhoids, otherwise normal.  Excellent prep.  10-year repeat.  EGD 06/25/2023 was normal.  Duodenal biopsy negative for celiac.  Labs 05/2023 showed hemoglobin 10.8, hematocrit 33, MCV 82.  Normal total iron 55, ferritin 19, low iron saturation 12%.  Celiac labs negative.  Normal vitamin B12 of 719.  Hemoglobin has ranged between 9 and 10 for the past year.  Current Outpatient Medications  Medication Sig Dispense Refill   albuterol (VENTOLIN HFA) 108 (90 Base) MCG/ACT inhaler Inhale 2 puffs into the lungs every 6 (six) hours as needed for wheezing or shortness of breath. 6.7 g 2   amoxicillin (AMOXIL) 500 MG capsule Take 1 capsule (500 mg total) by mouth 3 (three) times daily until gone. (Patient not taking: Reported on 06/18/2023) 15 capsule 0   Atogepant (QULIPTA PO) Take 1 tablet by mouth daily. (Patient not taking: Reported on 06/18/2023)     busPIRone (BUSPAR) 5 MG tablet Take 1 tablet (5 mg total) by mouth 2 (two) times daily (Patient not taking: Reported on 06/18/2023) 60 tablet 2   butalbital-acetaminophen-caffeine (FIORICET) 50-325-40 MG tablet Take 2 tablets by mouth every 4 (four) hours as needed for pain (Patient not taking: Reported on 06/18/2023) 30 tablet 0    cyclobenzaprine (FLEXERIL) 10 MG tablet Take 1 tablet (10 mg total) by mouth 2 (two) times daily between meals as needed  for muscle spasms for up to 10 days 20 tablet 0   dihydroergotamine (MIGRANAL) 4 MG/ML nasal spray Place into the nose. (Patient not taking: Reported on 06/18/2023)     Erenumab-aooe (AIMOVIG) 140 MG/ML SOAJ Inject 140 mg into the skin every 28 (twenty-eight) days. 1 mL 6   escitalopram (LEXAPRO) 10 MG tablet Take 1 tablet (10 mg total) by mouth daily. Can start with 1/2 tablet daily for 1-2 weeks. 30 tablet 2   estradiol (ESTRACE) 0.1 MG/GM vaginal cream SMARTSIG:2 Gram(s) Vaginal Twice a Week (Patient not taking: Reported on 06/18/2023)     estradiol (ESTRACE) 0.1 MG/GM vaginal cream Place 2 g vaginally 2 (two) times a week. (Patient not taking: Reported on 06/18/2023) 42.5 g 1   ferrous sulfate 220 (44 Fe) MG/5ML solution Take 220 mg by mouth 2 (two) times daily with a meal. (Patient not taking: Reported on 06/18/2023)     ferrous sulfate 220 (44 Fe) MG/5ML solution Take 5 mLs (220 mg total) by mouth 2 (two) times daily. 473 mL 1   HYDROcodone-acetaminophen (NORCO) 5-325 MG tablet Take 1 tablet by mouth every 6 (six) hours as needed for up to 6 doses for moderate pain. (Patient not taking: Reported on 06/18/2023) 6 tablet 0   hydrocortisone 2.5 % ointment Apply 1 Application topically 2 (two) times daily  for 7 days. (Patient not taking: Reported  on 06/18/2023) 28.35 g 0   hydrOXYzine (ATARAX) 10 MG tablet Take 1 tablet (10 mg total) by mouth daily as needed for Anxiety (flight travel) for up to 10 days (Patient not taking: Reported on 06/18/2023) 10 tablet 0   lubiprostone (AMITIZA) 8 MCG capsule Take 1 capsule (8 mcg total) by mouth 2 (two) times daily for constipation. Take with food. (Patient not taking: Reported on 06/18/2023) 60 capsule 2   magic mouthwash (nystatin, lidocaine, diphenhydrAMINE, alum & mag hydroxide) suspension Swish and spit 5 mLs 4 (four) times daily as needed for mouth  pain. (Patient not taking: Reported on 06/18/2023) 180 mL 0   ondansetron (ZOFRAN) 4 MG tablet Take 1 tablet (4 mg total) by mouth daily as needed for nausea or vomiting. 30 tablet 1   ondansetron (ZOFRAN) 4 MG tablet Take 1 tablet (4 mg total) by mouth every 8 (eight) hours as needed. 20 tablet 0   Plecanatide (TRULANCE) 3 MG TABS Take 1 tablet (3 mg total) by mouth daily. 90 tablet 3   promethazine-dextromethorphan (PROMETHAZINE-DM) 6.25-15 MG/5ML syrup Take 5 mLs by mouth 4 (four) times daily as needed for cough. (Patient not taking: Reported on 06/18/2023) 118 mL 0   Rimegepant Sulfate (NURTEC) 75 MG TBDP Take 1 tablet (75 mg total) by mouth as directed at onset of migraine once a day as needed. 16 tablet 3   No current facility-administered medications for this visit.    Allergies as of 07/08/2023 - Review Complete 06/25/2023  Allergen Reaction Noted   Vicodin [hydrocodone-acetaminophen] Rash 09/17/2015    Past Medical History:  Diagnosis Date   Anemia    Arthritis    B12 deficiency    Gallstones 10/2022   History of gestational hypertension    Hypertension    Preterm labor    Uterine leiomyoma     Past Surgical History:  Procedure Laterality Date   COLONOSCOPY WITH PROPOFOL N/A 06/25/2023   Procedure: COLONOSCOPY WITH PROPOFOL;  Surgeon: Midge Minium, MD;  Location: [facility];  Service: Endoscopy;  Laterality: N/A;   ESOPHAGOGASTRODUODENOSCOPY (EGD) WITH PROPOFOL N/A 06/25/2023   Procedure: ESOPHAGOGASTRODUODENOSCOPY (EGD) WITH PROPOFOL;  Surgeon: Midge Minium, MD;  Location: [facility];  Service: Endoscopy;  Laterality: N/A;   POLYPECTOMY  06/25/2023   Procedure: POLYPECTOMY;  Surgeon: Midge Minium, MD;  Location: [facility];  Service: Endoscopy;;   ROBOTIC ASSISTED LAPAROSCOPIC HYSTERECTOMY AND SALPINGECTOMY Bilateral 12/20/2021   Procedure: XI ROBOTIC ASSISTED LAPAROSCOPIC HYSTERECTOMY AND SALPINGECTOMY;  Surgeon: Christeen Douglas, MD;  Location: [facility]  ORS;  Service: Gynecology;  Laterality: Bilateral;    Review of Systems:    All systems reviewed and negative except where noted in HPI.   Physical Examination:   LMP 10/27/2021 (Exact Date) Comment: having depot shots  General: Well-nourished, well-developed in no acute distress.  Lungs: Clear to auscultation bilaterally. Non-labored. Heart: Regular rate and rhythm, no murmurs rubs or gallops.  Abdomen: Bowel sounds are normal; Abdomen is Soft; No hepatosplenomegaly, masses or hernias;  No Abdominal Tenderness; No guarding or rebound tenderness. Neuro: Alert and oriented x 3.  Grossly intact.  Psych: Alert and cooperative, normal mood and affect.   Imaging Studies: DG Chest 2 View  Result Date: 06/11/2023 CLINICAL DATA:  Chest pain. EXAM: CHEST - 2 VIEW COMPARISON:  04/15/2023. FINDINGS: Clear lungs. Normal heart size and mediastinal contours. No pleural effusion or pneumothorax. Visualized bones and upper abdomen are unremarkable. IMPRESSION: No evidence of acute cardiopulmonary disease. Electronically Signed   By: Zollie Beckers  Wiggins M.D.   On: 06/11/2023 12:46    Assessment and Plan:   Rosaline Torcivia is a 42 y.o. y/o female returns for follow-up of chronic anemia.  Recent EGD and colonoscopy unrevealing.  Celiac labs negative.  1.  Chronic iron deficiency anemia and B12 deficiency.  Continue follow-up with PCP and hematology to monitor labs.  Schedule capsule endoscopy to complete her GI workup.  2.  Chronic constipation  Continue Trulance   Celso Amy, PA-C  Follow up ***  BP check ***

## 2023-07-09 ENCOUNTER — Ambulatory Visit: Payer: 59 | Attending: Neurology

## 2023-07-15 ENCOUNTER — Other Ambulatory Visit: Payer: Self-pay

## 2023-07-16 ENCOUNTER — Other Ambulatory Visit: Payer: Self-pay

## 2023-07-16 MED ORDER — AIMOVIG 140 MG/ML ~~LOC~~ SOAJ
SUBCUTANEOUS | 6 refills | Status: DC
  Filled 2023-07-16: qty 1, 28d supply, fill #0
  Filled 2023-08-07 – 2023-08-10 (×3): qty 1, 28d supply, fill #1
  Filled 2023-09-08: qty 1, 28d supply, fill #2
  Filled 2023-09-28: qty 1, 28d supply, fill #3
  Filled ????-??-??: fill #2

## 2023-07-21 ENCOUNTER — Inpatient Hospital Stay: Payer: 59 | Attending: Oncology

## 2023-07-23 ENCOUNTER — Telehealth: Payer: Self-pay

## 2023-07-23 ENCOUNTER — Inpatient Hospital Stay: Payer: 59

## 2023-07-23 ENCOUNTER — Inpatient Hospital Stay: Payer: 59 | Admitting: Oncology

## 2023-07-23 NOTE — Telephone Encounter (Signed)
Lmovm for pt to r/t my call to schedule a capsule study

## 2023-07-28 ENCOUNTER — Other Ambulatory Visit: Payer: Self-pay

## 2023-08-03 NOTE — Telephone Encounter (Signed)
Called pt, someone answered, then hung up... I will send Mychart msg

## 2023-08-10 ENCOUNTER — Other Ambulatory Visit: Payer: Self-pay

## 2023-08-11 ENCOUNTER — Other Ambulatory Visit (HOSPITAL_COMMUNITY): Payer: Self-pay

## 2023-09-03 ENCOUNTER — Other Ambulatory Visit: Payer: Self-pay

## 2023-09-04 ENCOUNTER — Other Ambulatory Visit: Payer: Self-pay

## 2023-09-11 ENCOUNTER — Other Ambulatory Visit: Payer: Self-pay

## 2023-09-11 MED ORDER — ESCITALOPRAM OXALATE 10 MG PO TABS
10.0000 mg | ORAL_TABLET | Freq: Every day | ORAL | 2 refills | Status: AC
  Filled 2023-11-16: qty 30, 30d supply, fill #0

## 2023-09-11 MED ORDER — ESCITALOPRAM OXALATE 10 MG PO TABS
10.0000 mg | ORAL_TABLET | Freq: Every day | ORAL | 2 refills | Status: AC
  Filled 2023-09-11: qty 30, 30d supply, fill #0

## 2023-09-28 ENCOUNTER — Other Ambulatory Visit: Payer: Self-pay

## 2023-10-08 ENCOUNTER — Other Ambulatory Visit: Payer: Self-pay

## 2023-10-08 MED ORDER — VALACYCLOVIR HCL 1 G PO TABS
1.0000 g | ORAL_TABLET | Freq: Two times a day (BID) | ORAL | 0 refills | Status: AC
  Filled 2023-10-08: qty 14, 7d supply, fill #0

## 2023-10-08 MED ORDER — FERROUS SULFATE 220 (44 FE) MG/5ML PO SOLN
ORAL | 1 refills | Status: DC
  Filled 2023-10-08: qty 473, 47d supply, fill #0
  Filled 2023-12-29: qty 473, 47d supply, fill #1

## 2023-10-19 ENCOUNTER — Other Ambulatory Visit: Payer: Self-pay | Admitting: Emergency Medicine

## 2023-10-19 ENCOUNTER — Other Ambulatory Visit: Payer: Self-pay

## 2023-10-19 DIAGNOSIS — G43109 Migraine with aura, not intractable, without status migrainosus: Secondary | ICD-10-CM | POA: Diagnosis not present

## 2023-10-19 DIAGNOSIS — M5481 Occipital neuralgia: Secondary | ICD-10-CM | POA: Diagnosis not present

## 2023-10-19 MED ORDER — EMGALITY 120 MG/ML ~~LOC~~ SOAJ
240.0000 mg | SUBCUTANEOUS | 0 refills | Status: AC
  Filled 2023-10-20: qty 2, 30d supply, fill #0
  Filled 2023-10-21 – 2023-11-06 (×3): qty 2, 28d supply, fill #0

## 2023-10-19 MED ORDER — ONDANSETRON HCL 4 MG PO TABS
4.0000 mg | ORAL_TABLET | Freq: Three times a day (TID) | ORAL | 2 refills | Status: DC | PRN
  Filled 2023-10-19: qty 30, 10d supply, fill #0

## 2023-10-19 MED ORDER — LASMIDITAN SUCCINATE 100 MG PO TABS
100.0000 mg | ORAL_TABLET | ORAL | 1 refills | Status: AC | PRN
Start: 2023-10-19 — End: ?
  Filled 2023-10-19: qty 8, 30d supply, fill #0
  Filled 2023-10-21: qty 8, 15d supply, fill #0

## 2023-10-19 MED ORDER — EMGALITY 120 MG/ML ~~LOC~~ SOAJ
120.0000 mg | SUBCUTANEOUS | 11 refills | Status: AC
  Filled 2023-12-04: qty 1, 28d supply, fill #0
  Filled 2023-12-29 – 2024-01-13 (×4): qty 1, 28d supply, fill #1
  Filled 2024-01-14: qty 1, 30d supply, fill #1
  Filled 2024-01-21 – 2024-02-11 (×2): qty 1, 30d supply, fill #2
  Filled 2024-03-04 – 2024-03-23 (×3): qty 1, 30d supply, fill #3
  Filled 2024-04-13 – 2024-04-22 (×3): qty 1, 30d supply, fill #4
  Filled 2024-05-18: qty 1, 30d supply, fill #5
  Filled 2024-06-07 – 2024-06-16 (×2): qty 1, 30d supply, fill #6
  Filled 2024-07-07 – 2024-07-10 (×2): qty 1, 30d supply, fill #7
  Filled 2024-09-26: qty 1, 30d supply, fill #8

## 2023-10-20 ENCOUNTER — Other Ambulatory Visit: Payer: Self-pay

## 2023-10-20 ENCOUNTER — Ambulatory Visit
Admission: EM | Admit: 2023-10-20 | Discharge: 2023-10-20 | Disposition: A | Discharge status: Home / Self Care | Payer: 59

## 2023-10-20 DIAGNOSIS — G43109 Migraine with aura, not intractable, without status migrainosus: Secondary | ICD-10-CM

## 2023-10-20 HISTORY — DX: Migraine, unspecified, not intractable, without status migrainosus: G43.909

## 2023-10-20 NOTE — ED Triage Notes (Signed)
Headache since Friday. Went to Neurologist yesterday. Given a toradol shot. Patient woke up still having a headache-nausea-pressure in the back of head.

## 2023-10-20 NOTE — Discharge Instructions (Addendum)
Take your home meds as prescribed by neurologist. Call neurologist,Dr. Sherryll Burger, for further management of complicated migraine headaches. Go to Er for new or worsening issues or concerns.

## 2023-10-20 NOTE — ED Provider Notes (Signed)
[facility]    CSN: [id number] Arrival date & time: 10/20/23  5956      History   Chief Complaint Chief Complaint  Patient presents with   Headache    HPI Noralee Calafiore is a 42 y.o. female.   41 year old female, China Gaut, presents to urgent care for evaluation of headache since Friday.  Patient went to her neurologist today was given a Toradol shot, awakened this morning still having headache, nausea, pressure in back of head.    Patient has history of occipital migraine, complicated headache.    The history is provided by the patient. No language interpreter was used.    Past Medical History:  Diagnosis Date   Anemia    Arthritis    B12 deficiency    Gallstones 10/2022   History of gestational hypertension    Hypertension    Migraine    Preterm labor    Uterine leiomyoma     Patient Active Problem List   Diagnosis Date Noted   Complicated migraine 10/20/2023   Polyp of descending colon 06/25/2023   Excessive and frequent menstruation 05/12/2023   History of uterine fibroid 05/12/2023   Carpal tunnel syndrome of right wrist 03/12/2021   NSVT (nonsustained ventricular tachycardia) (HCC)    Chronic headaches 02/06/2020   Recurrent syncope 02/06/2020   Occipital neuralgia 02/06/2020   Sinus tachycardia 02/06/2020   ANA positive 02/06/2020   Syncope 02/06/2020   Intractable menstrual migraine without status migrainosus 01/31/2020   Iron deficiency anemia 10/28/2019   Pregnancy 08/04/2018   Indication for care in labor or delivery 08/03/2018   Labor and delivery indication for care or intervention 08/02/2018   Chronic hypertension with superimposed pre-eclampsia 07/30/2018   Chronic hypertension with superimposed preeclampsia 07/21/2018   Short interval between pregnancies affecting pregnancy in first trimester, antepartum 03/18/2018   Advanced maternal age in multigravida, first trimester 03/18/2018   Hypertension 11/17/2017   Fetal  demise, greater than 22 weeks, antepartum, single gestation 11/13/2017   Hx of preeclampsia, prior pregnancy, currently pregnant 10/22/2017   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   Herpes simplex 07/29/2017   Obesity (BMI 35.0-39.9 without comorbidity) 07/23/2017    Past Surgical History:  Procedure Laterality Date   COLONOSCOPY WITH PROPOFOL N/A 06/25/2023   Procedure: COLONOSCOPY WITH PROPOFOL;  Surgeon: Midge Minium, MD;  Location: [facility];  Service: Endoscopy;  Laterality: N/A;   ESOPHAGOGASTRODUODENOSCOPY (EGD) WITH PROPOFOL N/A 06/25/2023   Procedure: ESOPHAGOGASTRODUODENOSCOPY (EGD) WITH PROPOFOL;  Surgeon: Midge Minium, MD;  Location: [facility];  Service: Endoscopy;  Laterality: N/A;   POLYPECTOMY  06/25/2023   Procedure: POLYPECTOMY;  Surgeon: Midge Minium, MD;  Location: [facility];  Service: Endoscopy;;   ROBOTIC ASSISTED LAPAROSCOPIC HYSTERECTOMY AND SALPINGECTOMY Bilateral 12/20/2021   Procedure: XI ROBOTIC ASSISTED LAPAROSCOPIC HYSTERECTOMY AND SALPINGECTOMY;  Surgeon: Christeen Douglas, MD;  Location: [facility];  Service: Gynecology;  Laterality: Bilateral;    OB History     Gravida  6   Para  6   Term  4   Preterm  1   AB      Living  5      SAB      IAB      Ectopic      Multiple  0   Live Births  1            Home Medications    Prior to Admission medications   Medication Sig Start Date End Date Taking? Authorizing Provider  cyclobenzaprine (FLEXERIL) 10 MG tablet Take 1 tablet (10 mg total) by mouth 2 (two) times daily as needed for Muscle spasms 07/08/23  Yes   Erenumab-aooe (AIMOVIG) 140 MG/ML SOAJ Inject 140 mg subcutaneously every 28 (twenty-eight) days 07/16/23  Yes   escitalopram (LEXAPRO) 10 MG tablet Take 1 tablet (10 mg total) by mouth once daily 09/11/23  Yes   ferrous sulfate 220 (44 Fe) MG/5ML solution Take 5 mLs (220 mg total) by mouth 2 (two) times daily. 04/17/23  Yes   Lasmiditan Succinate 100 MG TABS Take 1 tablet  (100 mg total) by mouth as needed for migraine rescue. No more than 1 tablet every 24 hours. 10/19/23  Yes   Rimegepant Sulfate (NURTEC) 75 MG TBDP Take 1 tablet (75 mg total) by mouth as directed at onset of migraine once a day as needed. 03/09/23  Yes   loratadine (CLARITIN) 10 MG tablet Take 10 mg by mouth daily. 02/03/20 05/13/20  [provider]  albuterol (VENTOLIN HFA) 108 (90 Base) MCG/ACT inhaler Inhale 2 puffs into the lungs every 6 (six) hours as needed for wheezing or shortness of breath. 04/16/23   Sharman Cheek, MD  amoxicillin (AMOXIL) 500 MG capsule Take 1 capsule (500 mg total) by mouth 3 (three) times daily until gone. Patient not taking: Reported on 06/18/2023 05/08/23     Atogepant (QULIPTA PO) Take 1 tablet by mouth daily. Patient not taking: Reported on 06/18/2023    [provider]  butalbital-acetaminophen-caffeine (FIORICET) 50-325-40 MG tablet Take 2 tablets by mouth every 4 (four) hours as needed for pain Patient not taking: Reported on 06/18/2023 02/19/23   Alm Bustard, NP  cyclobenzaprine (FLEXERIL) 10 MG tablet Take 1 tablet (10 mg total) by mouth 2 (two) times daily between meals as needed  for muscle spasms for up to 10 days 04/23/23     dihydroergotamine (MIGRANAL) 4 MG/ML nasal spray Place into the nose. Patient not taking: Reported on 06/18/2023 03/11/23   [provider]  Erenumab-aooe (AIMOVIG) 140 MG/ML SOAJ Inject 140 mg into the skin every 28 (twenty-eight) days. 12/24/22     escitalopram (LEXAPRO) 10 MG tablet Take 1 tablet (10 mg total) by mouth daily. 09/11/23     estradiol (ESTRACE) 0.1 MG/GM vaginal cream SMARTSIG:2 Gram(s) Vaginal Twice a Week Patient not taking: Reported on 06/18/2023    [provider]  estradiol (ESTRACE) 0.1 MG/GM vaginal cream Place 2 g vaginally 2 (two) times a week. Patient not taking: Reported on 06/18/2023 12/15/22     ferrous sulfate 220 (44 Fe) MG/5ML solution Take 220 mg by mouth 2 (two) times daily with  a meal. Patient not taking: Reported on 06/18/2023    [provider]  ferrous sulfate 220 (44 Fe) MG/5ML solution Take 5 mLs (220 mg total) by mouth 2 (two) times daily 10/08/23     Galcanezumab-gnlm (EMGALITY) 120 MG/ML SOAJ Inject 240 mg into the skin once for 1 dose. Then 28 days later start with 120 mg/1 ml per month. 10/19/23 10/20/23    Galcanezumab-gnlm (EMGALITY) 120 MG/ML SOAJ Inject 120 mg into the skin every 28 (twenty-eight) days. 10/19/23     HYDROcodone-acetaminophen (NORCO) 5-325 MG tablet Take 1 tablet by mouth every 6 (six) hours as needed for up to 6 doses for moderate pain. Patient not taking: Reported on 06/18/2023 11/06/22   Sung Amabile, DO  hydrocortisone 2.5 % ointment Apply 1 Application topically 2 (two) times daily  for 7 days. Patient not taking: Reported on  06/18/2023 01/21/23     hydrOXYzine (ATARAX) 10 MG tablet Take 1 tablet (10 mg total) by mouth daily as needed for Anxiety (flight travel) for up to 10 days Patient not taking: Reported on 06/18/2023 04/29/23     magic mouthwash (nystatin, lidocaine, diphenhydrAMINE, alum & mag hydroxide) suspension Swish and spit 5 mLs 4 (four) times daily as needed for mouth pain. Patient not taking: Reported on 06/18/2023 12/16/22   Valinda Hoar, NP  ondansetron (ZOFRAN) 4 MG tablet Take 1 tablet (4 mg total) by mouth daily as needed for nausea or vomiting. 11/08/22 11/08/23  Pilar Jarvis, MD  ondansetron (ZOFRAN) 4 MG tablet Take 1 tablet (4 mg total) by mouth every 8 (eight) hours as needed. 04/16/23   Sharman Cheek, MD  ondansetron (ZOFRAN) 4 MG tablet Take 1 tablet (4 mg total) by mouth every 8 (eight) hours as needed. 10/19/23     Plecanatide (TRULANCE) 3 MG TABS Take 1 tablet (3 mg total) by mouth daily. 05/14/23 05/08/24  Celso Amy, PA-C  promethazine-dextromethorphan (PROMETHAZINE-DM) 6.25-15 MG/5ML syrup Take 5 mLs by mouth 4 (four) times daily as needed for cough. Patient not taking: Reported on 06/18/2023 12/19/22   Radford Pax, NP  valACYclovir (VALTREX) 1000 MG tablet Take 1 tablet (1,000 mg total) by mouth 2 (two) times daily for 7days 10/08/23     linaclotide (LINZESS) 72 MCG capsule Take 1 capsule (72 mcg total) by mouth daily. 02/19/23 04/23/23    XULANE 150-35 MCG/24HR transdermal patch 1 patch once a week. 09/04/19 11/18/19  [provider]    Family History Family History  Problem Relation Age of Onset   Hypertension Mother    Alcohol abuse Mother    Liver disease Mother    Other Father        unknown medical history   Hypertension Maternal Grandmother    Diabetes Maternal Grandmother    Dementia Maternal Grandmother    Autoimmune disease Daughter     Social History Social History   Tobacco Use   Smoking status: Never   Smokeless tobacco: Never  Vaping Use   Vaping status: Never Used  Substance Use Topics   Alcohol use: Yes    Alcohol/week: 1.0 standard drink of alcohol    Types: 1 Glasses of wine per week    Comment: occ wine   Drug use: No     Allergies   Vicodin [hydrocodone-acetaminophen]   Review of Systems Review of Systems  Constitutional:  Negative for fever.  Gastrointestinal:  Positive for nausea.  Neurological:  Positive for headaches. Negative for dizziness, seizures, weakness and numbness.  All other systems reviewed and are negative.    Physical Exam Triage Vital Signs ED Triage Vitals  Encounter Vitals Group     BP 10/20/23 0907 123/89     Systolic BP Percentile --      Diastolic BP Percentile --      Pulse Rate 10/20/23 0907 82     Resp 10/20/23 0907 17     Temp 10/20/23 0907 98.3 F (36.8 C)     Temp Source 10/20/23 0907 Oral     SpO2 10/20/23 0907 99 %     Weight --      Height --      Head Circumference --      Peak Flow --      Pain Score 10/20/23 0905 7     Pain Loc --      Pain Education --  Exclude from Growth Chart --    No data found.  Updated Vital Signs BP 123/89 (BP Location: Left Arm)   Pulse 82   Temp 98.3 F  (36.8 C) (Oral)   Resp 17   LMP 10/27/2021 (Exact Date) Comment: having depot shots  SpO2 99%   Visual Acuity Right Eye Distance:   Left Eye Distance:   Bilateral Distance:    Right Eye Near:   Left Eye Near:    Bilateral Near:     Physical Exam Vitals and nursing note reviewed.  Constitutional:      General: She is not in acute distress.    Appearance: She is well-developed.  HENT:     Head: Normocephalic and atraumatic.   Eyes:     Conjunctiva/sclera: Conjunctivae normal.  Cardiovascular:     Rate and Rhythm: Normal rate and regular rhythm.     Heart sounds: No murmur heard. Pulmonary:     Effort: Pulmonary effort is normal. No respiratory distress.     Breath sounds: Normal breath sounds.  Abdominal:     Palpations: Abdomen is soft.     Tenderness: There is no abdominal tenderness.  Musculoskeletal:        General: No swelling.     Cervical back: Neck supple.  Skin:    General: Skin is warm and dry.     Capillary Refill: Capillary refill takes less than 2 seconds.  Neurological:     General: No focal deficit present.     Mental Status: She is alert and oriented to person, place, and time.     GCS: GCS eye subscore is 4. GCS verbal subscore is 5. GCS motor subscore is 6.     Cranial Nerves: No cranial nerve deficit.     Sensory: No sensory deficit.     Motor: Motor function is intact.     Coordination: Coordination is intact.     Gait: Gait is intact.  Psychiatric:        Attention and Perception: Attention normal.        Mood and Affect: Mood normal.        Speech: Speech normal.        Behavior: Behavior normal.      UC Treatments / Results  Labs (all labs ordered are listed, but only abnormal results are displayed) Labs Reviewed - No data to display  EKG   Radiology No results found.  Procedures Procedures (including critical care time)  Medications Ordered in UC Medications - No data to display  Initial Impression / Assessment and Plan  / UC Course  I have reviewed the triage vital signs and the nursing notes.  Pertinent labs & imaging results that were available during my care of the patient were reviewed by me and considered in my medical decision making (see chart for details).  Clinical Course as of 10/20/23 1218  Tue Oct 20, 2023  1610 Will refer patient back to her neurologist [JD]    Clinical Course User Index [JD] Svara Twyman, Para March, NP   Discussed exam findings and plan of care with patient, patient will use her muscle relaxers and Fioricet as prescribed , patient has a call back into neurology for further follow-up ; strict go to ER precautions given.   Patient verbalized understanding to this provider.  Ddx: Complicated migraine, recurrent headaches, viral illness, allergies Final Clinical Impressions(s) / UC Diagnoses   Final diagnoses:  Complicated migraine     Discharge Instructions  Take your home meds as prescribed by neurologist. Call neurologist,Dr. Sherryll Burger, for further management of complicated migraine headaches. Go to Er for new or worsening issues or concerns.      ED Prescriptions   None    PDMP not reviewed this encounter.   Clancy Gourd, NP 10/20/23 [phone]

## 2023-10-21 ENCOUNTER — Other Ambulatory Visit: Payer: Self-pay

## 2023-10-23 ENCOUNTER — Other Ambulatory Visit: Payer: Self-pay

## 2023-10-23 MED ORDER — EMGALITY 120 MG/ML ~~LOC~~ SOAJ
SUBCUTANEOUS | 0 refills | Status: DC
  Filled 2023-12-01: qty 2, 30d supply, fill #0

## 2023-11-03 ENCOUNTER — Other Ambulatory Visit: Payer: Self-pay

## 2023-11-06 ENCOUNTER — Other Ambulatory Visit: Payer: Self-pay

## 2023-11-16 ENCOUNTER — Other Ambulatory Visit: Payer: Self-pay

## 2023-11-17 ENCOUNTER — Other Ambulatory Visit: Payer: Self-pay

## 2023-11-17 MED ORDER — RIMEGEPANT SULFATE 75 MG PO TBDP
75.0000 mg | ORAL_TABLET | Freq: Every day | ORAL | 3 refills | Status: AC
  Filled 2023-11-17: qty 16, 32d supply, fill #0
  Filled 2024-01-25 – 2024-02-04 (×3): qty 16, 32d supply, fill #1
  Filled 2024-04-13 – 2024-06-07 (×3): qty 16, 32d supply, fill #2
  Filled 2024-07-07: qty 16, 32d supply, fill #3

## 2023-12-01 ENCOUNTER — Other Ambulatory Visit: Payer: Self-pay

## 2023-12-03 ENCOUNTER — Other Ambulatory Visit: Payer: Self-pay

## 2023-12-03 DIAGNOSIS — M5481 Occipital neuralgia: Secondary | ICD-10-CM | POA: Diagnosis not present

## 2023-12-03 DIAGNOSIS — D500 Iron deficiency anemia secondary to blood loss (chronic): Secondary | ICD-10-CM | POA: Diagnosis not present

## 2023-12-03 DIAGNOSIS — Z1231 Encounter for screening mammogram for malignant neoplasm of breast: Secondary | ICD-10-CM | POA: Diagnosis not present

## 2023-12-03 DIAGNOSIS — I10 Essential (primary) hypertension: Secondary | ICD-10-CM | POA: Diagnosis not present

## 2023-12-03 DIAGNOSIS — E66812 Obesity, class 2: Secondary | ICD-10-CM | POA: Diagnosis not present

## 2023-12-03 DIAGNOSIS — F411 Generalized anxiety disorder: Secondary | ICD-10-CM | POA: Diagnosis not present

## 2023-12-03 MED ORDER — IBUPROFEN 600 MG PO TABS
600.0000 mg | ORAL_TABLET | Freq: Four times a day (QID) | ORAL | 0 refills | Status: AC
  Filled 2023-12-03: qty 10, 3d supply, fill #0

## 2023-12-03 MED ORDER — WEGOVY 0.25 MG/0.5ML ~~LOC~~ SOAJ
0.2500 mg | SUBCUTANEOUS | 1 refills | Status: DC
  Filled 2023-12-03: qty 2, 28d supply, fill #0

## 2023-12-04 ENCOUNTER — Other Ambulatory Visit: Payer: Self-pay | Admitting: Internal Medicine

## 2023-12-04 ENCOUNTER — Other Ambulatory Visit: Payer: Self-pay

## 2023-12-04 DIAGNOSIS — Z1231 Encounter for screening mammogram for malignant neoplasm of breast: Secondary | ICD-10-CM

## 2023-12-08 ENCOUNTER — Other Ambulatory Visit: Payer: Self-pay

## 2023-12-15 ENCOUNTER — Ambulatory Visit
Admission: RE | Admit: 2023-12-15 | Discharge: 2023-12-15 | Disposition: A | Discharge status: Home / Self Care | Payer: 59 | Source: Ambulatory Visit | Attending: Internal Medicine | Admitting: Internal Medicine

## 2023-12-15 DIAGNOSIS — Z1231 Encounter for screening mammogram for malignant neoplasm of breast: Secondary | ICD-10-CM | POA: Diagnosis not present

## 2023-12-17 ENCOUNTER — Other Ambulatory Visit: Payer: Self-pay

## 2023-12-18 ENCOUNTER — Other Ambulatory Visit: Payer: Self-pay

## 2023-12-23 ENCOUNTER — Encounter: Payer: Self-pay | Admitting: Physician Assistant

## 2023-12-23 ENCOUNTER — Telehealth: Payer: Self-pay | Admitting: Physician Assistant

## 2023-12-23 NOTE — Telephone Encounter (Signed)
 I called the patient to reschedule appointment. Patient is schedule for follow up appointment.

## 2023-12-28 NOTE — Progress Notes (Deleted)
 Celso Amy, PA-C [address]  [address]  [place], [place] [address]  Main: [phone]  Fax: [phone]   Primary Care Physician: Enid Baas, MD  Primary Gastroenterologist:  ***  CC: F/U iron deficiency anemia  HPI: Cindy Hays is a 43 y.o. female returns for follow-up of iron deficiency anemia.  I saw patient June 2024 to evaluate iron deficiency anemia.  She was scheduled for EGD and colonoscopy.  She is taking OTC vitamin B-12 and iron with benefit.  Admits to taking moderate ibuprofen as needed for pain.  Most recent lab 06/2023 showed stable improved hemoglobin 11.8, MCV 83.  06/25/2023 EGD by Dr. Servando Snare: Normal.  Duodenal biopsies negative for celiac.  06/25/2023 Colonoscopy: 1 small 5 mm hyperplastic polyp removed.  Nonbleeding grade 2 internal hemorrhoids.  Excellent prep.  10-year repeat.  She saw Dr. Cathie Hoops hematologist 04/24/2023 for iron deficiency anemia.  Continued on oral iron.  History of chronic iron deficiency anemia since 2016.  Previous hysterectomy for menorrhagia.  Had cholecystectomy for cholelithiasis 10/2022.     History of chronic constipation.  Took Linzess and MiraLAX in the past.  Currently takes Trulance sporadically which helps.  Family history significant for mother deceased due to liver disease related to alcohol/HCV.  No family history of colon cancer or GI malignancies.    Current Outpatient Medications  Medication Sig Dispense Refill   albuterol (VENTOLIN HFA) 108 (90 Base) MCG/ACT inhaler Inhale 2 puffs into the lungs every 6 (six) hours as needed for wheezing or shortness of breath. 6.7 g 2   amoxicillin (AMOXIL) 500 MG capsule Take 1 capsule (500 mg total) by mouth 3 (three) times daily until gone. (Patient not taking: Reported on 06/18/2023) 15 capsule 0   Atogepant (QULIPTA PO) Take 1 tablet by mouth daily. (Patient not taking: Reported on 06/18/2023)     butalbital-acetaminophen-caffeine (FIORICET) 50-325-40 MG tablet Take 2 tablets  by mouth every 4 (four) hours as needed for pain (Patient not taking: Reported on 06/18/2023) 30 tablet 0   cyclobenzaprine (FLEXERIL) 10 MG tablet Take 1 tablet (10 mg total) by mouth 2 (two) times daily between meals as needed  for muscle spasms for up to 10 days 20 tablet 0   cyclobenzaprine (FLEXERIL) 10 MG tablet Take 1 tablet (10 mg total) by mouth 2 (two) times daily as needed for Muscle spasms 20 tablet 0   dihydroergotamine (MIGRANAL) 4 MG/ML nasal spray Place into the nose. (Patient not taking: Reported on 06/18/2023)     Erenumab-aooe (AIMOVIG) 140 MG/ML SOAJ Inject 140 mg into the skin every 28 (twenty-eight) days. 1 mL 6   escitalopram (LEXAPRO) 10 MG tablet Take 1 tablet (10 mg total) by mouth once daily 30 tablet 2   escitalopram (LEXAPRO) 10 MG tablet Take 1 tablet (10 mg total) by mouth daily. 30 tablet 2   estradiol (ESTRACE) 0.1 MG/GM vaginal cream SMARTSIG:2 Gram(s) Vaginal Twice a Week (Patient not taking: Reported on 06/18/2023)     estradiol (ESTRACE) 0.1 MG/GM vaginal cream Place 2 g vaginally 2 (two) times a week. (Patient not taking: Reported on 06/18/2023) 42.5 g 1   ferrous sulfate 220 (44 Fe) MG/5ML solution Take 220 mg by mouth 2 (two) times daily with a meal. (Patient not taking: Reported on 06/18/2023)     ferrous sulfate 220 (44 Fe) MG/5ML solution Take 5 mLs (220 mg total) by mouth 2 (two) times daily. 473 mL 1   ferrous sulfate 220 (44 Fe) MG/5ML solution Take  5 mLs (220 mg total) by mouth 2 (two) times daily 473 mL 1   Galcanezumab-gnlm (EMGALITY) 120 MG/ML SOAJ Inject 120 mg into the skin every 28 (twenty-eight) days. 1 mL 11   HYDROcodone-acetaminophen (NORCO) 5-325 MG tablet Take 1 tablet by mouth every 6 (six) hours as needed for up to 6 doses for moderate pain. (Patient not taking: Reported on 06/18/2023) 6 tablet 0   hydrocortisone 2.5 % ointment Apply 1 Application topically 2 (two) times daily  for 7 days. (Patient not taking: Reported on 06/18/2023) 28.35 g 0    hydrOXYzine (ATARAX) 10 MG tablet Take 1 tablet (10 mg total) by mouth daily as needed for Anxiety (flight travel) for up to 10 days (Patient not taking: Reported on 06/18/2023) 10 tablet 0   ibuprofen (ADVIL) 600 MG tablet Take 1 tablet (600 mg total) by mouth every 4 (four) - 6 (six) hours. 10 tablet 0   Lasmiditan Succinate 100 MG TABS Take 1 tablet (100 mg total) by mouth as needed for migraine rescue. No more than 1 tablet every 24 hours. 8 tablet 1   magic mouthwash (nystatin, lidocaine, diphenhydrAMINE, alum & mag hydroxide) suspension Swish and spit 5 mLs 4 (four) times daily as needed for mouth pain. (Patient not taking: Reported on 06/18/2023) 180 mL 0   ondansetron (ZOFRAN) 4 MG tablet Take 1 tablet (4 mg total) by mouth every 8 (eight) hours as needed. 20 tablet 0   ondansetron (ZOFRAN) 4 MG tablet Take 1 tablet (4 mg total) by mouth every 8 (eight) hours as needed. 30 tablet 2   Plecanatide (TRULANCE) 3 MG TABS Take 1 tablet (3 mg total) by mouth daily. 90 tablet 3   promethazine-dextromethorphan (PROMETHAZINE-DM) 6.25-15 MG/5ML syrup Take 5 mLs by mouth 4 (four) times daily as needed for cough. (Patient not taking: Reported on 06/18/2023) 118 mL 0   Rimegepant Sulfate (NURTEC) 75 MG TBDP Take 1 tablet (75 mg total) by mouth as directed at onset of migraine once a day as needed. 16 tablet 3   Semaglutide-Weight Management (WEGOVY) 0.25 MG/0.5ML SOAJ Inject 0.25 mg into the skin once a week. 2 mL 1   valACYclovir (VALTREX) 1000 MG tablet Take 1 tablet (1,000 mg total) by mouth 2 (two) times daily for 7days 14 tablet 0   No current facility-administered medications for this visit.    Allergies as of 12/29/2023 - Review Complete 10/20/2023  Allergen Reaction Noted   Vicodin [hydrocodone-acetaminophen] Rash 09/17/2015    Past Medical History:  Diagnosis Date   Anemia    Arthritis    B12 deficiency    Cervical lymphadenopathy 03/26/2010   Contraceptive management 01/19/2008   Dysuria  11/13/2014   Fatigue 06/15/2009   Gallstones 10/2022   High-risk pregnancy 06/27/2017   43 y.o. [id number] at [redacted]w[redacted]d by  LMP c/w  week ultrasound  Sex of baby and name: boy " "   Factors complicating this pregnancy   1. Hx IUFD last pregnancy at 26.4wks (10/2017)   MFM referral placed- seen by MFM 5/2; anti-beta 2 glycoprotein - negative   Plan q4wk growth [place]   antepartum testing nst  2x/ week + weekly NST   2. CHTN on  meds since postpartum last delivery 11/2017- currently taking Pro   History of gestational hypertension    Hypertension    Migraine    Nipple discharge 11/13/2014   Papanicolaou smear of cervix with low grade squamous intraepithelial lesion (LGSIL) 11/16/2014   Pregnancy test positive 06/15/2017  Preterm labor    Uterine leiomyoma     Past Surgical History:  Procedure Laterality Date   COLONOSCOPY WITH PROPOFOL N/A 06/25/2023   Procedure: COLONOSCOPY WITH PROPOFOL;  Surgeon: Midge Minium, MD;  Location[facility];  Service: Endoscopy;  Laterality: N/A;   ESOPHAGOGASTRODUODENOSCOPY (EGD) WITH PROPOFOL N/A 06/25/2023   Procedure: ESOPHAGOGASTRODUODENOSCOPY (EGD) WITH PROPOFOL;  Surgeon: Midge Minium, MD;  Location: [facility];  Service: Endoscopy;  Laterality: N/A;   POLYPECTOMY  06/25/2023   Procedure: POLYPECTOMY;  Surgeon: Midge Minium, MD;  Location: [facility];  Service: Endoscopy;;   ROBOTIC ASSISTED LAPAROSCOPIC HYSTERECTOMY AND SALPINGECTOMY Bilateral 12/20/2021   Procedure: XI ROBOTIC ASSISTED LAPAROSCOPIC HYSTERECTOMY AND SALPINGECTOMY;  Surgeon: Christeen Douglas, MD;  Location: [facility];  Service: Gynecology;  Laterality: Bilateral;    Review of Systems:    All systems reviewed and negative except where noted in HPI.   Physical Examination:   LMP 10/27/2021 (Exact Date) Comment: having depot shots  General: Well-nourished, well-developed in no acute distress.  Lungs: Clear to auscultation bilaterally. Non-labored. Heart: Regular rate  and rhythm, no murmurs rubs or gallops.  Abdomen: Bowel sounds are normal; Abdomen is Soft; No hepatosplenomegaly, masses or hernias;  No Abdominal Tenderness; No guarding or rebound tenderness. Neuro: Alert and oriented x 3.  Grossly intact.  Psych: Alert and cooperative, normal mood and affect.   Imaging Studies: MM 3D SCREENING MAMMOGRAM BILATERAL BREAST Result Date: 12/16/2023 CLINICAL DATA:  Screening. This is the patient's initial baseline mammogram. EXAM: DIGITAL SCREENING BILATERAL MAMMOGRAM WITH TOMOSYNTHESIS AND CAD TECHNIQUE: Bilateral screening digital craniocaudal and mediolateral oblique mammograms were obtained. Bilateral screening digital breast tomosynthesis was performed. The images were evaluated with computer-aided detection. COMPARISON:  None. ACR Breast Density Category b: There are scattered areas of fibroglandular density. FINDINGS: There are no findings suspicious for malignancy. IMPRESSION: No mammographic evidence of malignancy. A result letter of this screening mammogram will be mailed directly to the patient. RECOMMENDATION: Screening mammogram in one year. (Code:SM-B-01Y) BI-RADS CATEGORY  1: Negative. Electronically Signed   By: Hulan Saas M.D.   On: 12/16/2023 15:57    Assessment and Plan:   Cindy Hays is a 43 y.o. y/o female returns for 49-month follow-up of chronic anemia attributed to iron deficiency and B12 deficiency.  Currently improved and stable on OTC oral B12 and iron.  EGD and colonoscopy 06/2023 were unrevealing for sources of anemia.  She has no GI symptoms.  1.  Chronic iron deficiency anemia since 2016-stable and improved.  EGD and colonoscopy unrevealing 06/2023.  2.  Chronic B12 deficiency  Plan: -Labs ordered today: CBC, ferritin, iron panel, B12. -Continue oral iron and B12. -Avoid NSAIDs -If she has recurrent iron deficiency anemia, then capsule endoscopy is the next step.    Celso Amy, PA-C  Follow up ***  BP check ***

## 2023-12-29 ENCOUNTER — Ambulatory Visit: Payer: 59 | Admitting: Physician Assistant

## 2023-12-29 ENCOUNTER — Other Ambulatory Visit: Payer: Self-pay

## 2023-12-30 ENCOUNTER — Other Ambulatory Visit: Payer: Self-pay

## 2023-12-31 ENCOUNTER — Other Ambulatory Visit: Payer: Self-pay

## 2024-01-01 ENCOUNTER — Other Ambulatory Visit: Payer: Self-pay

## 2024-01-04 ENCOUNTER — Other Ambulatory Visit: Payer: Self-pay

## 2024-01-04 MED ORDER — CYCLOBENZAPRINE HCL 10 MG PO TABS
10.0000 mg | ORAL_TABLET | Freq: Two times a day (BID) | ORAL | 0 refills | Status: DC | PRN
Start: 2024-01-04 — End: 2024-02-21
  Filled 2024-01-04: qty 20, 10d supply, fill #0

## 2024-01-05 ENCOUNTER — Other Ambulatory Visit: Payer: Self-pay

## 2024-01-06 ENCOUNTER — Other Ambulatory Visit: Payer: Self-pay

## 2024-01-06 MED ORDER — AMOXICILLIN 500 MG PO CAPS
500.0000 mg | ORAL_CAPSULE | Freq: Three times a day (TID) | ORAL | 0 refills | Status: DC
Start: 2024-01-06 — End: 2024-02-21
  Filled 2024-01-06: qty 21, 7d supply, fill #0

## 2024-01-13 ENCOUNTER — Other Ambulatory Visit: Payer: Self-pay

## 2024-01-14 ENCOUNTER — Other Ambulatory Visit: Payer: Self-pay

## 2024-01-21 ENCOUNTER — Other Ambulatory Visit: Payer: Self-pay

## 2024-01-25 ENCOUNTER — Other Ambulatory Visit: Payer: Self-pay

## 2024-01-26 ENCOUNTER — Other Ambulatory Visit: Payer: Self-pay

## 2024-02-04 ENCOUNTER — Other Ambulatory Visit: Payer: Self-pay

## 2024-02-04 DIAGNOSIS — G5603 Carpal tunnel syndrome, bilateral upper limbs: Secondary | ICD-10-CM | POA: Diagnosis not present

## 2024-02-04 DIAGNOSIS — M1710 Unilateral primary osteoarthritis, unspecified knee: Secondary | ICD-10-CM | POA: Diagnosis not present

## 2024-02-04 DIAGNOSIS — G43109 Migraine with aura, not intractable, without status migrainosus: Secondary | ICD-10-CM | POA: Diagnosis not present

## 2024-02-11 ENCOUNTER — Other Ambulatory Visit: Payer: Self-pay

## 2024-02-12 ENCOUNTER — Other Ambulatory Visit (HOSPITAL_BASED_OUTPATIENT_CLINIC_OR_DEPARTMENT_OTHER): Payer: Self-pay

## 2024-02-15 ENCOUNTER — Other Ambulatory Visit: Payer: Self-pay

## 2024-02-18 ENCOUNTER — Other Ambulatory Visit (HOSPITAL_COMMUNITY): Payer: Self-pay

## 2024-02-18 DIAGNOSIS — Z0000 Encounter for general adult medical examination without abnormal findings: Secondary | ICD-10-CM | POA: Diagnosis not present

## 2024-02-18 DIAGNOSIS — R5383 Other fatigue: Secondary | ICD-10-CM | POA: Diagnosis not present

## 2024-02-18 DIAGNOSIS — F411 Generalized anxiety disorder: Secondary | ICD-10-CM | POA: Diagnosis not present

## 2024-02-18 DIAGNOSIS — M5481 Occipital neuralgia: Secondary | ICD-10-CM | POA: Diagnosis not present

## 2024-02-18 DIAGNOSIS — I10 Essential (primary) hypertension: Secondary | ICD-10-CM | POA: Diagnosis not present

## 2024-02-21 ENCOUNTER — Encounter: Payer: Self-pay | Admitting: Emergency Medicine

## 2024-02-21 ENCOUNTER — Ambulatory Visit
Admission: EM | Admit: 2024-02-21 | Discharge: 2024-02-21 | Disposition: A | Discharge status: Home / Self Care | Attending: Family Medicine | Admitting: Family Medicine

## 2024-02-21 DIAGNOSIS — R5383 Other fatigue: Secondary | ICD-10-CM | POA: Diagnosis not present

## 2024-02-21 DIAGNOSIS — G43109 Migraine with aura, not intractable, without status migrainosus: Secondary | ICD-10-CM | POA: Diagnosis not present

## 2024-02-21 DIAGNOSIS — M791 Myalgia, unspecified site: Secondary | ICD-10-CM | POA: Diagnosis not present

## 2024-02-21 DIAGNOSIS — R7989 Other specified abnormal findings of blood chemistry: Secondary | ICD-10-CM | POA: Diagnosis not present

## 2024-02-21 LAB — COMPREHENSIVE METABOLIC PANEL WITH GFR
ALT: 62 U/L — ABNORMAL HIGH (ref 0–44)
AST: 62 U/L — ABNORMAL HIGH (ref 15–41)
Albumin: 4.1 g/dL (ref 3.5–5.0)
Alkaline Phosphatase: 52 U/L (ref 38–126)
Anion gap: 12 (ref 5–15)
BUN: 10 mg/dL (ref 6–20)
CO2: 21 mmol/L — ABNORMAL LOW (ref 22–32)
Calcium: 9.6 mg/dL (ref 8.9–10.3)
Chloride: 101 mmol/L (ref 98–111)
Creatinine, Ser: 0.57 mg/dL (ref 0.44–1.00)
GFR, Estimated: >60 mL/min (ref >60)
Glucose, Bld: 116 mg/dL — ABNORMAL HIGH (ref 70–99)
Potassium: 3.7 mmol/L (ref 3.5–5.1)
Sodium: 134 mmol/L — ABNORMAL LOW (ref 135–145)
Total Bilirubin: 0.5 mg/dL (ref 0.0–1.2)
Total Protein: 8.5 g/dL — ABNORMAL HIGH (ref 6.5–8.1)

## 2024-02-21 LAB — CBC WITH DIFFERENTIAL/PLATELET
Abs Immature Granulocytes: 0.02 10*3/uL (ref 0.00–0.07)
Basophils Absolute: 0 10*3/uL (ref 0.0–0.1)
Basophils Relative: 1 %
Eosinophils Absolute: 0.1 10*3/uL (ref 0.0–0.5)
Eosinophils Relative: 2 %
HCT: 35.8 % — ABNORMAL LOW (ref 36.0–46.0)
Hemoglobin: 11.7 g/dL — ABNORMAL LOW (ref 12.0–15.0)
Immature Granulocytes: 0 %
Lymphocytes Relative: 41 %
Lymphs Abs: 2.3 10*3/uL (ref 0.7–4.0)
MCH: 27 pg (ref 26.0–34.0)
MCHC: 32.7 g/dL (ref 30.0–36.0)
MCV: 82.5 fL (ref 80.0–100.0)
Monocytes Absolute: 0.6 10*3/uL (ref 0.1–1.0)
Monocytes Relative: 10 %
Neutro Abs: 2.7 10*3/uL (ref 1.7–7.7)
Neutrophils Relative %: 46 %
Platelets: 350 10*3/uL (ref 150–400)
RBC: 4.34 MIL/uL (ref 3.87–5.11)
RDW: 15.4 % (ref 11.5–15.5)
WBC: 5.7 10*3/uL (ref 4.0–10.5)
nRBC: 0 % (ref 0.0–0.2)

## 2024-02-21 LAB — RESP PANEL BY RT-PCR (FLU A&B, COVID) ARPGX2
Influenza A by PCR: NEGATIVE
Influenza B by PCR: NEGATIVE
SARS Coronavirus 2 by RT PCR: NEGATIVE

## 2024-02-21 MED ORDER — PREDNISONE 10 MG (21) PO TBPK: ORAL_TABLET | Freq: Every day | ORAL | 0 refills | Status: DC

## 2024-02-21 MED ORDER — CYCLOBENZAPRINE HCL 10 MG PO TABS: 10.0000 mg | ORAL_TABLET | Freq: Two times a day (BID) | ORAL | 0 refills | Status: AC | PRN

## 2024-02-21 NOTE — ED Provider Notes (Signed)
 [facility]    CSN: [id number] Arrival date & time: 02/21/24  0944      History   Chief Complaint Chief Complaint  Patient presents with   Headache   Diarrhea    HPI Cindy Hays is a 43 y.o. female.   HPI  History obtained from the patient. Cindy Hays presents for headache, nausea, diarrhea, numbness, tingling, sweats, dizziness that started yesterday after eating chicken salad. Took Nrytec as she has history of migraines. Started first with nausea then diarrhea and then the headache started.  Symptoms are resolving but continues to have tightness in the back of her neck with a small headache.  When she sits up she feels off balance. Endorses "waves of hot flashes." Has history of carpal tunnel in her hands and arms.   Denies fever, cough, nasal congestion, current headache, chest pain or shortness of breath.    Past Medical History:  Diagnosis Date   Anemia    Arthritis    B12 deficiency    Cervical lymphadenopathy 03/26/2010   Contraceptive management 01/19/2008   Dysuria 11/13/2014   Fatigue 06/15/2009   Gallstones 10/2022   High-risk pregnancy 06/27/2017   43 y.o. [id number] at [redacted]w[redacted]d by  LMP c/w  week ultrasound  Sex of baby and name: boy " "   Factors complicating this pregnancy   1. Hx IUFD last pregnancy at 26.4wks (10/2017)   MFM referral placed- seen by MFM 5/2; anti-beta 2 glycoprotein - negative   Plan q4wk growth [place]   antepartum testing nst  2x/ week + weekly NST   2. CHTN on  meds since postpartum last delivery 11/2017- currently taking Pro   History of gestational hypertension    Hypertension    Migraine    Nipple discharge 11/13/2014   Papanicolaou smear of cervix with low grade squamous intraepithelial lesion (LGSIL) 11/16/2014   Pregnancy test positive 06/15/2017   Preterm labor    Uterine leiomyoma     Patient Active Problem List   Diagnosis Date Noted   Complicated migraine 10/20/2023   Polyp of descending colon 06/25/2023    Excessive and frequent menstruation 05/12/2023   History of uterine fibroid 05/12/2023   Carpal tunnel syndrome of right wrist 03/12/2021   NSVT (nonsustained ventricular tachycardia) (HCC)    Chronic headaches 02/06/2020   Recurrent syncope 02/06/2020   Occipital neuralgia 02/06/2020   Sinus tachycardia 02/06/2020   ANA positive 02/06/2020   Syncope 02/06/2020   Intractable menstrual migraine without status migrainosus 01/31/2020   Iron deficiency anemia 10/28/2019   Pregnancy 08/04/2018   Indication for care in labor or delivery 08/03/2018   Labor and delivery indication for care or intervention 08/02/2018   Chronic hypertension with superimposed pre-eclampsia 07/30/2018   Chronic hypertension with superimposed preeclampsia 07/21/2018   Short interval between pregnancies affecting pregnancy in first trimester, antepartum 03/18/2018   Advanced maternal age in multigravida, first trimester 03/18/2018   Hypertension 11/17/2017   Fetal demise, greater than 22 weeks, antepartum, single gestation 11/13/2017   Hx of preeclampsia, prior pregnancy, currently pregnant 10/22/2017   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   Herpes simplex 07/29/2017   Obesity (BMI 35.0-39.9 without comorbidity) 07/23/2017    Past Surgical History:  Procedure Laterality Date   COLONOSCOPY WITH PROPOFOL N/A 06/25/2023   Procedure: COLONOSCOPY WITH PROPOFOL;  Surgeon: Midge Minium, MD;  Location: [facility];  Service: Endoscopy;  Laterality: N/A;   ESOPHAGOGASTRODUODENOSCOPY (EGD) WITH PROPOFOL N/A 06/25/2023   Procedure: ESOPHAGOGASTRODUODENOSCOPY (EGD) WITH PROPOFOL;  Surgeon:  Midge Minium, MD;  Location: [facility];  Service: Endoscopy;  Laterality: N/A;   POLYPECTOMY  06/25/2023   Procedure: POLYPECTOMY;  Surgeon: Midge Minium, MD;  Location: [facility];  Service: Endoscopy;;   ROBOTIC ASSISTED LAPAROSCOPIC HYSTERECTOMY AND SALPINGECTOMY Bilateral 12/20/2021   Procedure: XI ROBOTIC ASSISTED  LAPAROSCOPIC HYSTERECTOMY AND SALPINGECTOMY;  Surgeon: Christeen Douglas, MD;  Location: [facility];  Service: Gynecology;  Laterality: Bilateral;    OB History     Gravida  6   Para  6   Term  4   Preterm  1   AB      Living  5      SAB      IAB      Ectopic      Multiple  0   Live Births  1            Home Medications    Prior to Admission medications   Medication Sig Start Date End Date Taking? Authorizing Provider  predniSONE (STERAPRED UNI-PAK 21 TAB) 10 MG (21) TBPK tablet Take by mouth daily. Take 6 tabs by mouth daily for 1, then 5 tabs for 1 day, then 4 tabs for 1 day, then 3 tabs for 1 day, then 2 tabs for 1 day, then 1 tab for 1 day. 02/21/24  Yes Kejon Feild, DO  Rimegepant Sulfate (NURTEC) 75 MG TBDP Take 1 tablet (75 mg total) by mouth as directed at onset of migraine once a day as needed. 11/17/23  Yes   loratadine (CLARITIN) 10 MG tablet Take 10 mg by mouth daily. 02/03/20 05/13/20  [provider]  albuterol (VENTOLIN HFA) 108 (90 Base) MCG/ACT inhaler Inhale 2 puffs into the lungs every 6 (six) hours as needed for wheezing or shortness of breath. 04/16/23   Sharman Cheek, MD  Atogepant (QULIPTA PO) Take 1 tablet by mouth daily. Patient not taking: Reported on 06/18/2023    [provider]  butalbital-acetaminophen-caffeine (FIORICET) 50-325-40 MG tablet Take 2 tablets by mouth every 4 (four) hours as needed for pain Patient not taking: Reported on 06/18/2023 02/19/23   Alm Bustard, NP  cyclobenzaprine (FLEXERIL) 10 MG tablet Take 1 tablet (10 mg total) by mouth 2 (two) times daily as needed for Muscle spasms 02/21/24   Katha Cabal, DO  dihydroergotamine (MIGRANAL) 4 MG/ML nasal spray Place into the nose. Patient not taking: Reported on 06/18/2023 03/11/23   [provider]  Erenumab-aooe (AIMOVIG) 140 MG/ML SOAJ Inject 140 mg into the skin every 28 (twenty-eight) days. 12/24/22     escitalopram (LEXAPRO) 10 MG tablet Take 1  tablet (10 mg total) by mouth once daily Patient taking differently: Take 5 mg by mouth daily. 09/11/23     escitalopram (LEXAPRO) 10 MG tablet Take 1 tablet (10 mg total) by mouth daily. 09/11/23     estradiol (ESTRACE) 0.1 MG/GM vaginal cream SMARTSIG:2 Gram(s) Vaginal Twice a Week Patient not taking: Reported on 06/18/2023    [provider]  estradiol (ESTRACE) 0.1 MG/GM vaginal cream Place 2 g vaginally 2 (two) times a week. Patient not taking: Reported on 06/18/2023 12/15/22     ferrous sulfate 220 (44 Fe) MG/5ML solution Take 220 mg by mouth 2 (two) times daily with a meal. Patient not taking: Reported on 06/18/2023    [provider]  ferrous sulfate 220 (44 Fe) MG/5ML solution Take 5 mLs (220 mg total) by mouth 2 (two) times daily. 04/17/23     ferrous sulfate 220 (44  Fe) MG/5ML solution Take 5 mLs (220 mg total) by mouth 2 (two) times daily 10/08/23     Galcanezumab-gnlm (EMGALITY) 120 MG/ML SOAJ Inject 120 mg into the skin every 28 (twenty-eight) days. 10/19/23     HYDROcodone-acetaminophen (NORCO) 5-325 MG tablet Take 1 tablet by mouth every 6 (six) hours as needed for up to 6 doses for moderate pain. Patient not taking: Reported on 06/18/2023 11/06/22   Sung Amabile, DO  hydrocortisone 2.5 % ointment Apply 1 Application topically 2 (two) times daily  for 7 days. Patient not taking: Reported on 06/18/2023 01/21/23     hydrOXYzine (ATARAX) 10 MG tablet Take 1 tablet (10 mg total) by mouth 3 (three) times daily as needed for anxiety. 02/23/24   Marinell Blight, MD  ibuprofen (ADVIL) 600 MG tablet Take 1 tablet (600 mg total) by mouth every 4 (four) - 6 (six) hours. 12/03/23     Lasmiditan Succinate 100 MG TABS Take 1 tablet (100 mg total) by mouth as needed for migraine rescue. No more than 1 tablet every 24 hours. 10/19/23     magic mouthwash (nystatin, lidocaine, diphenhydrAMINE, alum & mag hydroxide) suspension Swish and spit 5 mLs 4 (four) times daily as needed for mouth  pain. Patient not taking: Reported on 06/18/2023 12/16/22   Valinda Hoar, NP  ondansetron (ZOFRAN) 4 MG tablet Take 1 tablet (4 mg total) by mouth every 8 (eight) hours as needed. 04/16/23   Sharman Cheek, MD  ondansetron (ZOFRAN) 4 MG tablet Take 1 tablet (4 mg total) by mouth every 8 (eight) hours as needed. 10/19/23     Plecanatide (TRULANCE) 3 MG TABS Take 1 tablet (3 mg total) by mouth daily. 05/14/23 05/08/24  Celso Amy, PA-C  Semaglutide-Weight Management (WEGOVY) 0.25 MG/0.5ML SOAJ Inject 0.25 mg into the skin once a week. 12/03/23     valACYclovir (VALTREX) 1000 MG tablet Take 1 tablet (1,000 mg total) by mouth 2 (two) times daily for 7days 10/08/23     linaclotide (LINZESS) 72 MCG capsule Take 1 capsule (72 mcg total) by mouth daily. 02/19/23 04/23/23    XULANE 150-35 MCG/24HR transdermal patch 1 patch once a week. 09/04/19 11/18/19  [provider]    Family History Family History  Problem Relation Age of Onset   Hypertension Mother    Alcohol abuse Mother    Liver disease Mother    Other Father        unknown medical history   Hypertension Maternal Grandmother    Diabetes Maternal Grandmother    Dementia Maternal Grandmother    Autoimmune disease Daughter     Social History Social History   Tobacco Use   Smoking status: Never   Smokeless tobacco: Never  Vaping Use   Vaping status: Never Used  Substance Use Topics   Alcohol use: Yes    Alcohol/week: 1.0 standard drink of alcohol    Types: 1 Glasses of wine per week    Comment: occ wine   Drug use: No     Allergies   Vicodin [hydrocodone-acetaminophen]   Review of Systems Review of Systems: negative unless otherwise stated in HPI.      Physical Exam Triage Vital Signs ED Triage Vitals  Encounter Vitals Group     BP --      Systolic BP Percentile --      Diastolic BP Percentile --      Pulse --      Resp --      Temp --  Temp src --      SpO2 --      Weight 02/21/24 0953 184 lb 4.9  oz (83.6 kg)     Height 02/21/24 0953 5\' 1"  (1.549 m)     Head Circumference --      Peak Flow --      Pain Score 02/21/24 0952 2     Pain Loc --      Pain Education --      Exclude from Growth Chart --    No data found.  Updated Vital Signs BP 100/75 (BP Location: Right Arm)   Pulse 93   Temp 98.3 F (36.8 C) (Oral)   Resp 14   Ht 5\' 1"  (1.549 m)   Wt 83.6 kg   LMP 10/27/2021 (Exact Date) Comment: having depot shots  SpO2 100%   BMI 34.82 kg/m   Visual Acuity Right Eye Distance:   Left Eye Distance:   Bilateral Distance:    Right Eye Near:   Left Eye Near:    Bilateral Near:     Physical Exam GEN:     alert, non-toxic appearing female in no distress    HENT:  mucus membranes tachy,  oropharyngeal without lesions or erythema, no tonsillar hypertrophy or exudates, no nasal discharge, bilateral TM normal EYES:   pupils equal and reactive, EOMi, no scleral injection or discharge NECK:  normal ROM, no meningismus   RESP:  no increased work of breathing, clear to auscultation bilaterally CVS:   regular rate and rhythm NEURO:  alert, oriented, speech normal, CN 2-12 grossly intact, no facial droop,  sensation grossly intact, strength 5/5 bilateral UE and LE, normal coordination Skin:   warm and dry    UC Treatments / Results  Labs (all labs ordered are listed, but only abnormal results are displayed) Labs Reviewed  CBC WITH DIFFERENTIAL/PLATELET - Abnormal; Notable for the following components:      Result Value   Hemoglobin 11.7 (*)    HCT 35.8 (*)    All other components within normal limits  COMPREHENSIVE METABOLIC PANEL WITH GFR - Abnormal; Notable for the following components:   Sodium 134 (*)    CO2 21 (*)    Glucose, Bld 116 (*)    Total Protein 8.5 (*)    AST 62 (*)    ALT 62 (*)    All other components within normal limits  RESP PANEL BY RT-PCR (FLU A&B, COVID) [id number]    EKG   Radiology   Procedures Procedures (including critical care  time)  Medications Ordered in UC Medications - No data to display  Initial Impression / Assessment and Plan / UC Course  I have reviewed the triage vital signs and the nursing notes.  Pertinent labs & imaging results that were available during my care of the patient were reviewed by me and considered in my medical decision making (see chart for details).      Patient is a 43 y.o. female with history of migraines who presents for headache for the past day.  Has been having diarrhea and nausea.   Differential diagnosis includes but is not limited to migraine headache, tension headache, cluster headache, CVA, depression, intracranial hemorrhage, meningitis/encephalitis, giant cell arteritis, idiopathic intracranial hypertension, dehydration and analgesia abuse. There are no red flag symptoms associated with headache.  On chart review, CT Head from April 2023 was normal.   Vital signs are stable.  Overall, patient is well-appearing, well-hydrated and in no acute distress.  Cardiopulmonary  and neuro exams are normal.  EKG obtained as she reporting upper extremity tingling with nausea.  EKG obtained showing, sinus tachycardia with possible anterior infarct but anterior infarct is not new compared to previous EKG. No history of DVT or PE. Denies chest pain and shortness of breath.     COVID and influenza testing is negative. CBC showing stable normocytic anemia that has improved since July 2024.  CMP significant for elevated liver enzymes. Per chart review, pt with history of complicated migraines. Treat with headache cocktail (prednisone, antiemetic, muscle relaxer and Fioricet) at home. ED and return precautions reviewed. Advised to follow up with PCP.   Discussed MDM, treatment plan and plan for follow-up with patient who agrees with plan.      Final Clinical Impressions(s) / UC Diagnoses   Final diagnoses:  Fatigue, unspecified type  Myalgia  Elevated liver function tests  Complicated  migraine     Discharge Instructions      Your liver tests were slightly elevated today.  Your COVID and influenza tests are negative. Keep an eye on your LDL (bad cholesterol).  Increase you diet and exercise.  Follow up with your primary care doctor   For your headache: take your muscle relaxer, nausea medication and Fiorcet.  Stop by the pharmacy to pick up your prescriptions.  Follow up with your neurologist, if not improving.       ED Prescriptions     Medication Sig Dispense Auth. Provider   cyclobenzaprine (FLEXERIL) 10 MG tablet Take 1 tablet (10 mg total) by mouth 2 (two) times daily as needed for Muscle spasms 30 tablet Amina Menchaca, DO   predniSONE (STERAPRED UNI-PAK 21 TAB) 10 MG (21) TBPK tablet Take by mouth daily. Take 6 tabs by mouth daily for 1, then 5 tabs for 1 day, then 4 tabs for 1 day, then 3 tabs for 1 day, then 2 tabs for 1 day, then 1 tab for 1 day. 21 tablet Katha Cabal, DO      PDMP not reviewed this encounter.   Katha Cabal, DO 02/24/24 1022

## 2024-02-21 NOTE — Discharge Instructions (Addendum)
 Your liver tests were slightly elevated today.  Your COVID and influenza tests are negative. Keep an eye on your LDL (bad cholesterol).  Increase you diet and exercise.  Follow up with your primary care doctor   For your headache: take your muscle relaxer, nausea medication and Fiorcet.  Stop by the pharmacy to pick up your prescriptions.  Follow up with your neurologist, if not improving.

## 2024-02-21 NOTE — ED Triage Notes (Signed)
 Patient states that she developed a migraine headache last night.  Patient states that she took her Nurtec and started to feel dizzy and had diarrhea.  Patient states that this morning she has had some tingling in her hands.  Patient denies any cold symptoms.  Patient denies any fevers.

## 2024-02-23 ENCOUNTER — Emergency Department

## 2024-02-23 ENCOUNTER — Other Ambulatory Visit: Payer: Self-pay

## 2024-02-23 ENCOUNTER — Encounter: Payer: Self-pay | Admitting: Emergency Medicine

## 2024-02-23 ENCOUNTER — Emergency Department: Admission: EM | Admit: 2024-02-23 | Discharge: 2024-02-23 | Disposition: A | Discharge status: Home / Self Care

## 2024-02-23 DIAGNOSIS — R42 Dizziness and giddiness: Secondary | ICD-10-CM | POA: Diagnosis not present

## 2024-02-23 DIAGNOSIS — R202 Paresthesia of skin: Secondary | ICD-10-CM | POA: Insufficient documentation

## 2024-02-23 DIAGNOSIS — I10 Essential (primary) hypertension: Secondary | ICD-10-CM | POA: Insufficient documentation

## 2024-02-23 DIAGNOSIS — R55 Syncope and collapse: Secondary | ICD-10-CM | POA: Diagnosis not present

## 2024-02-23 DIAGNOSIS — R079 Chest pain, unspecified: Secondary | ICD-10-CM | POA: Diagnosis not present

## 2024-02-23 LAB — URINALYSIS, ROUTINE W REFLEX MICROSCOPIC
Bilirubin Urine: NEGATIVE
Glucose, UA: NEGATIVE mg/dL
Hgb urine dipstick: NEGATIVE
Ketones, ur: NEGATIVE mg/dL
Leukocytes,Ua: NEGATIVE
Nitrite: NEGATIVE
Protein, ur: NEGATIVE mg/dL
Specific Gravity, Urine: 1.001 — ABNORMAL LOW (ref 1.005–1.030)
pH: 7 (ref 5.0–8.0)

## 2024-02-23 LAB — BASIC METABOLIC PANEL WITH GFR
Anion gap: 8 (ref 5–15)
BUN: 8 mg/dL (ref 6–20)
CO2: 24 mmol/L (ref 22–32)
Calcium: 9.3 mg/dL (ref 8.9–10.3)
Chloride: 103 mmol/L (ref 98–111)
Creatinine, Ser: 0.63 mg/dL (ref 0.44–1.00)
GFR, Estimated: >60 mL/min (ref >60)
Glucose, Bld: 140 mg/dL — ABNORMAL HIGH (ref 70–99)
Potassium: 3.7 mmol/L (ref 3.5–5.1)
Sodium: 135 mmol/L (ref 135–145)

## 2024-02-23 LAB — TROPONIN I (HIGH SENSITIVITY): Troponin I (High Sensitivity): <2 ng/L (ref <18)

## 2024-02-23 LAB — CBC
HCT: 35.4 % — ABNORMAL LOW (ref 36.0–46.0)
Hemoglobin: 11.4 g/dL — ABNORMAL LOW (ref 12.0–15.0)
MCH: 26.6 pg (ref 26.0–34.0)
MCHC: 32.2 g/dL (ref 30.0–36.0)
MCV: 82.7 fL (ref 80.0–100.0)
Platelets: 321 10*3/uL (ref 150–400)
RBC: 4.28 MIL/uL (ref 3.87–5.11)
RDW: 15.1 % (ref 11.5–15.5)
WBC: 5.2 10*3/uL (ref 4.0–10.5)
nRBC: 0 % (ref 0.0–0.2)

## 2024-02-23 LAB — PREGNANCY, URINE: Preg Test, Ur: NEGATIVE

## 2024-02-23 LAB — MAGNESIUM: Magnesium: 1.6 mg/dL — ABNORMAL LOW (ref 1.7–2.4)

## 2024-02-23 MED ORDER — HYDROXYZINE HCL 25 MG PO TABS
25.0000 mg | ORAL_TABLET | Freq: Once | ORAL | Status: AC
  Administered 2024-02-23: 25 mg via ORAL
  Filled 2024-02-23: qty 1

## 2024-02-23 MED ORDER — HYDROXYZINE HCL 10 MG PO TABS
10.0000 mg | ORAL_TABLET | Freq: Three times a day (TID) | ORAL | 0 refills | Status: DC | PRN
  Filled 2024-02-23: qty 30, 10d supply, fill #0

## 2024-02-23 MED ORDER — KETOROLAC TROMETHAMINE 15 MG/ML IJ SOLN
15.0000 mg | Freq: Once | INTRAMUSCULAR | Status: AC
  Administered 2024-02-23: 15 mg via INTRAVENOUS
  Filled 2024-02-23: qty 1

## 2024-02-23 MED ORDER — METOCLOPRAMIDE HCL 5 MG/ML IJ SOLN
10.0000 mg | Freq: Once | INTRAMUSCULAR | Status: AC
  Administered 2024-02-23: 10 mg via INTRAVENOUS
  Filled 2024-02-23: qty 2

## 2024-02-23 MED ORDER — SODIUM CHLORIDE 0.9 % IV BOLUS
1000.0000 mL | Freq: Once | INTRAVENOUS | Status: AC
  Administered 2024-02-23: 1000 mL via INTRAVENOUS

## 2024-02-23 NOTE — ED Notes (Signed)
 Pt to ED for weakness, posterior HA, fast HR, dizziness, nausea (no vomiting) since Saturday. Seen Sunday at [facility], told was dehydrated. Pt ambulatory, skin dry.

## 2024-02-23 NOTE — ED Provider Notes (Signed)
 [name]    Event Date/Time   First MD Initiated Contact with Patient 02/23/24 1004     (approximate)   History   Dizziness Patient to ED via POV for dizziness. States she has been having numbness and tingling in all extremities as well. Ongoing since Saturday night, Seen at [facility] for same on 4/6. NAD noted. C/o headache. States she is having some anxiety.    HPI  Cindy Hays is a 43 y.o. female PMH SVT, iron deficiency anemia, hx hysterectomy, hypertension, current syncope presents for evaluation of dizziness -Per chart review, last seen in urgent care 2 days ago complaining of headache, nausea tingling sweats, dizziness that started after eating a chicken salad the day before.  Viral swab, CBC, CMP overall unremarkable at that time.   - Today, patient states she has been having intermittent lightheadedness over the past few days.  No vomiting or diarrhea, some intermittent nausea.  Also endorses increased anxiety and episodes of full body tingling sensation, denies any focal weakness or numbness.  Did experience some mild palpitations and chest tightness earlier today that have since resolved. -Notes that 2 family members have died over the past 2 years and endorses this as a recent source of stress. -No urinary symptoms -Also endorses headache similar to her chronic migraines, no preceding trauma, not acute onset, not worst HA of life      Physical Exam   Triage Vital Signs: ED Triage Vitals  Encounter Vitals Group     BP 02/23/24 0949 133/77     Systolic BP Percentile --      Diastolic BP Percentile --      Pulse Rate 02/23/24 0949 (!) 145     Resp 02/23/24 0949 18     Temp 02/23/24 0949 98.4 F (36.9 C)     Temp Source 02/23/24 0949 Oral     SpO2 02/23/24 0949 100 %     Weight 02/23/24 0948 190 lb (86.2 kg)     Height 02/23/24 0948 5' (1.524 m)     Head Circumference --      Peak Flow --      Pain Score 02/23/24 0948 6      Pain Loc --      Pain Education --      Exclude from Growth Chart --     Most recent vital signs: Vitals:   02/23/24 1130 02/23/24 1210  BP: 132/73   Pulse: (!) 127 (!) 109  Resp: 18 16  Temp:    SpO2: 98% 98%     General: Awake, no distress.  Does appear somewhat anxious. CV:  Good peripheral perfusion.  Tachycardic, regular rhythm, RP 2+, no murmurs appreciated. Resp:  Normal effort.  CTAB. Abd:  No distention.  No tenderness to the palpation throughout. Neuro:  Aox4, CN II-XII intact, FNF wnl, finger taps fast b/l, 5/5 strength in bilateral finger extension/grip, EHL/FHL. BUE AG 10+ sec no drift, BLE AG 5+ sec no drift. Ambulates with steady gait. SILT. Negative Rhomberg.    ED Results / Procedures / Treatments   Labs (all labs ordered are listed, but only abnormal results are displayed) Labs Reviewed  BASIC METABOLIC PANEL WITH GFR - Abnormal; Notable for the following components:      Result Value   Glucose, Bld 140 (*)    All other components within normal limits  CBC - Abnormal; Notable for the following components:   Hemoglobin 11.4 (*)  HCT 35.4 (*)    All other components within normal limits  URINALYSIS, ROUTINE W REFLEX MICROSCOPIC - Abnormal; Notable for the following components:   Color, Urine STRAW (*)    APPearance HAZY (*)    Specific Gravity, Urine 1.001 (*)    All other components within normal limits  MAGNESIUM - Abnormal; Notable for the following components:   Magnesium 1.6 (*)    All other components within normal limits  PREGNANCY, URINE  CBG MONITORING, ED  TROPONIN I (HIGH SENSITIVITY)  TROPONIN I (HIGH SENSITIVITY)     EKG  See ED course below.    RADIOLOGY X-ray unremarkable on my interpretation, radiology report reviewed  PROCEDURES:  Critical Care performed: No  Procedures   MEDICATIONS ORDERED IN ED: Medications  sodium chloride 0.9 % bolus 1,000 mL (0 mLs Intravenous Stopped 02/23/24 1210)  hydrOXYzine (ATARAX)  tablet 25 mg (25 mg Oral Given 02/23/24 1045)  metoCLOPramide (REGLAN) injection 10 mg (10 mg Intravenous Given 02/23/24 1125)  ketorolac (TORADOL) 15 MG/ML injection 15 mg (15 mg Intravenous Given 02/23/24 1125)     IMPRESSION / MDM / ASSESSMENT AND PLAN / ED COURSE  I reviewed the triage vital signs and the nursing notes.                              Differential diagnosis includes, but is not limited to, anemia, electrolyte abnormality, highly doubt ACS, consider transient arrhythmia, suspect some component of anxiety likely contributing to presentation as well.  Consider underlying dehydration.  No clinical findings to suggest CVA or other intracranial pathology, no headache red flags on my eval.  Suspect benign headache type, likely migraine.  Plan -Basic labs -IV fluid -EKG -IV fluid, Reglan, Toradol -Hydroxyzine -Reassess  Patient's presentation is most consistent with acute complicated illness / injury requiring diagnostic workup.  The patient is on the cardiac monitor to evaluate for evidence of arrhythmia and/or significant heart rate changes.  ED course below.  Workup unremarkable.  Overall unclear etiology of presentation, do suspect strong component of anxiety contributing.  Clinical concern for ACS at this time.  Possibility of transient arrhythmia.  Feeling much better after hydroxyzine and IV fluids.  Plan for PMD follow-up.  ED return precautions in place.  Patient agrees with plan.  Clinical Course as of 02/23/24 1216  Tue Feb 23, 2024  1005 Cbc w/ mild anemia, stable from prior. No leukocytosis.  [MM]  1007 Ecg = sinus tachycardia, rate 127, no ST elevation or depression, no significant repolarization normality, normal axis, normal intervals.  No evidence of ischemia nor arrhythmia on my read. [MM]  1121 Trop wnl [MM]  1151 Chest X-ray reviewed, unremarkable on my interpretation [MM]  1214 Patient feeling much better, symptoms have resolved, much less anxious after  hydroxyzine.  Heart rate has improved with IV fluid and antianxiety medications, now down to about 109.  No chest pain or shortness of breath.  Per chart review on prior encounters, frequently has mild tachycardia.  Discussed unremarkable workup here, unclear etiology of lightheadedness though no evidence of acute pathology today.  Consider possibility of transient arrhythmia, recommend patient discuss further with PMD.  Can consider Holter monitor in outpatient setting if recurrent and persistent.  Rx hydroxyzine.  ED return precautions in place.  Patient agrees with plan. [MM]    Clinical Course User Index [MM] Marinell Blight, MD     FINAL CLINICAL IMPRESSION(S) / ED DIAGNOSES  Final diagnoses:  Lightheadedness  Near syncope     Rx / DC Orders   ED Discharge Orders          Ordered    hydrOXYzine (ATARAX) 10 MG tablet  3 times daily PRN        02/23/24 1209             Note:  This document was prepared using Dragon voice recognition software and may include unintentional dictation errors.   Marinell Blight, MD 02/23/24 [phone]

## 2024-02-23 NOTE — ED Notes (Signed)
 Pt ambulatory to bathroom

## 2024-02-23 NOTE — Discharge Instructions (Addendum)
 Your evaluation in the emergency department was overall reassuring.  I am unsure as to the exact cause of your symptoms but saw no concerning findings today.  I prescribed an antianxiety medication to use as needed for any exacerbations of your anxiety, I recommend you follow-up with your primary care doctor for reevaluation.  Return to the emergency department with any new or worsening symptoms, and continue drink plenty of water daily in the meantime.

## 2024-02-23 NOTE — ED Triage Notes (Signed)
 Patient to ED via POV for dizziness. States she has been having numbness and tingling in all extremities as well. Ongoing since Saturday night, Seen at [facility] for same on 4/6. NAD noted. C/o headache. States she is having some anxiety.

## 2024-02-25 ENCOUNTER — Other Ambulatory Visit: Payer: Self-pay

## 2024-02-25 DIAGNOSIS — R899 Unspecified abnormal finding in specimens from other organs, systems and tissues: Secondary | ICD-10-CM | POA: Diagnosis not present

## 2024-02-25 DIAGNOSIS — I10 Essential (primary) hypertension: Secondary | ICD-10-CM | POA: Diagnosis not present

## 2024-02-25 DIAGNOSIS — E66812 Obesity, class 2: Secondary | ICD-10-CM | POA: Diagnosis not present

## 2024-02-25 DIAGNOSIS — M5489 Other dorsalgia: Secondary | ICD-10-CM | POA: Diagnosis not present

## 2024-02-25 DIAGNOSIS — M1711 Unilateral primary osteoarthritis, right knee: Secondary | ICD-10-CM | POA: Diagnosis not present

## 2024-02-25 DIAGNOSIS — F411 Generalized anxiety disorder: Secondary | ICD-10-CM | POA: Diagnosis not present

## 2024-02-25 DIAGNOSIS — G5603 Carpal tunnel syndrome, bilateral upper limbs: Secondary | ICD-10-CM | POA: Diagnosis not present

## 2024-02-25 DIAGNOSIS — Z0000 Encounter for general adult medical examination without abnormal findings: Secondary | ICD-10-CM | POA: Diagnosis not present

## 2024-02-25 MED ORDER — BUSPIRONE HCL 5 MG PO TABS
5.0000 mg | ORAL_TABLET | Freq: Two times a day (BID) | ORAL | 1 refills | Status: DC
  Filled 2024-02-25: qty 60, 30d supply, fill #0

## 2024-02-25 MED ORDER — CYCLOBENZAPRINE HCL 10 MG PO TABS
10.0000 mg | ORAL_TABLET | Freq: Two times a day (BID) | ORAL | 0 refills | Status: DC | PRN
  Filled 2024-02-25 – 2024-06-07 (×3): qty 20, 10d supply, fill #0

## 2024-02-25 MED ORDER — METFORMIN HCL ER 500 MG PO TB24
500.0000 mg | ORAL_TABLET | Freq: Every day | ORAL | 1 refills | Status: DC
  Filled 2024-02-25: qty 30, 30d supply, fill #0
  Filled 2024-06-07: qty 30, 30d supply, fill #1

## 2024-02-27 ENCOUNTER — Ambulatory Visit
Admission: EM | Admit: 2024-02-27 | Discharge: 2024-02-27 | Disposition: A | Discharge status: Home / Self Care | Attending: Emergency Medicine | Admitting: Emergency Medicine

## 2024-02-27 ENCOUNTER — Encounter: Payer: Self-pay | Admitting: Emergency Medicine

## 2024-02-27 DIAGNOSIS — K047 Periapical abscess without sinus: Secondary | ICD-10-CM | POA: Diagnosis not present

## 2024-02-27 MED ORDER — AMOXICILLIN-POT CLAVULANATE 875-125 MG PO TABS
1.0000 | ORAL_TABLET | Freq: Two times a day (BID) | ORAL | 0 refills | Status: AC
Start: 2024-02-27 — End: 2024-03-05

## 2024-02-27 NOTE — Discharge Instructions (Addendum)
 Take the Augmentin 875 mg twice daily with food for 7 days for treatment of your dental abscess.  Continue your salt water rinses to help soothe the gum tissue and possibly bring the abscess to ahead and allow it to rupture on its own.  If the abscess ruptures, do not swallow the pus, spit it out.  You should also continue salt water gargles if the abscess ruptures to prevent food collection in the pocket.  Continue rotating Tylenol and/or ibuprofen as needed for pain.  As we discussed, you can use food grade clove oil, or a whole clove, apply directly to the gum tissue that is sore.  This has a topical, local anesthetic effect.  Keep your follow-up appointment with your dentist on Monday.  If you develop any worsening pain, facial swelling, or fevers I recommend you go to [facility] where you can be evaluated by the dentist or oral surgeon on-call.

## 2024-02-27 NOTE — ED Provider Notes (Signed)
 [facility]    CSN: [id number] Arrival date & time: 02/27/24  0808      History   Chief Complaint Chief Complaint  Patient presents with   Dental Pain   Facial Swelling    HPI Cindy Hays is a 43 y.o. female.   HPI  43 year old female with past medical history significant for anemia, migraine headaches, hypertension, and uterine leiomyoma presents for evaluation of dental pain on her right upper gumline.  She had an extraction of her second molar 2 to 3 weeks ago and then developed the pain and swelling to the right side of her face 2 days ago.  She denies any fever or drainage.  She has been rotating ibuprofen and Tylenol to help with pain as well as using salt water gargles.  She has contacted her dentist and they are able to see her on Monday.  Past Medical History:  Diagnosis Date   Anemia    Arthritis    B12 deficiency    Cervical lymphadenopathy 03/26/2010   Contraceptive management 01/19/2008   Dysuria 11/13/2014   Fatigue 06/15/2009   Gallstones 10/2022   High-risk pregnancy 06/27/2017   43 y.o. [id number] at [redacted]w[redacted]d by  LMP c/w  week ultrasound  Sex of baby and name: boy " "   Factors complicating this pregnancy   1. Hx IUFD last pregnancy at 26.4wks (10/2017)   MFM referral placed- seen by MFM 5/2; anti-beta 2 glycoprotein - negative   Plan q4wk growth [place]   antepartum testing nst  2x/ week + weekly NST   2. CHTN on  meds since postpartum last delivery 11/2017- currently taking Pro   History of gestational hypertension    Hypertension    Migraine    Nipple discharge 11/13/2014   Papanicolaou smear of cervix with low grade squamous intraepithelial lesion (LGSIL) 11/16/2014   Pregnancy test positive 06/15/2017   Preterm labor    Uterine leiomyoma     Patient Active Problem List   Diagnosis Date Noted   Complicated migraine 10/20/2023   Polyp of descending colon 06/25/2023   Excessive and frequent menstruation 05/12/2023   History of uterine  fibroid 05/12/2023   Carpal tunnel syndrome of right wrist 03/12/2021   NSVT (nonsustained ventricular tachycardia) (HCC)    Chronic headaches 02/06/2020   Recurrent syncope 02/06/2020   Occipital neuralgia 02/06/2020   Sinus tachycardia 02/06/2020   ANA positive 02/06/2020   Syncope 02/06/2020   Intractable menstrual migraine without status migrainosus 01/31/2020   Iron deficiency anemia 10/28/2019   Pregnancy 08/04/2018   Indication for care in labor or delivery 08/03/2018   Labor and delivery indication for care or intervention 08/02/2018   Chronic hypertension with superimposed pre-eclampsia 07/30/2018   Chronic hypertension with superimposed preeclampsia 07/21/2018   Short interval between pregnancies affecting pregnancy in first trimester, antepartum 03/18/2018   Advanced maternal age in multigravida, first trimester 03/18/2018   Hypertension 11/17/2017   Fetal demise, greater than 22 weeks, antepartum, single gestation 11/13/2017   Hx of preeclampsia, prior pregnancy, currently pregnant 10/22/2017   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   Herpes simplex 07/29/2017   Obesity (BMI 35.0-39.9 without comorbidity) 07/23/2017    Past Surgical History:  Procedure Laterality Date   COLONOSCOPY WITH PROPOFOL N/A 06/25/2023   Procedure: COLONOSCOPY WITH PROPOFOL;  Surgeon: Midge Minium, MD;  Location: [facility];  Service: Endoscopy;  Laterality: N/A;   ESOPHAGOGASTRODUODENOSCOPY (EGD) WITH PROPOFOL N/A 06/25/2023   Procedure: ESOPHAGOGASTRODUODENOSCOPY (EGD) WITH PROPOFOL;  Surgeon: Servando Snare,  Darren, MD;  Location: [facility];  Service: Endoscopy;  Laterality: N/A;   POLYPECTOMY  06/25/2023   Procedure: POLYPECTOMY;  Surgeon: Marnee Sink, MD;  Location: [facility];  Service: Endoscopy;;   ROBOTIC ASSISTED LAPAROSCOPIC HYSTERECTOMY AND SALPINGECTOMY Bilateral 12/20/2021   Procedure: XI ROBOTIC ASSISTED LAPAROSCOPIC HYSTERECTOMY AND SALPINGECTOMY;  Surgeon: Prescilla Brod, MD;   Location: [facility];  Service: Gynecology;  Laterality: Bilateral;    OB History     Gravida  6   Para  6   Term  4   Preterm  1   AB      Living  5      SAB      IAB      Ectopic      Multiple  0   Live Births  1            Home Medications    Prior to Admission medications   Medication Sig Start Date End Date Taking? Authorizing Provider  amoxicillin-clavulanate (AUGMENTIN) 875-125 MG tablet Take 1 tablet by mouth every 12 (twelve) hours for 7 days. 02/27/24 03/05/24 Yes Kent Pear, NP  loratadine (CLARITIN) 10 MG tablet Take 10 mg by mouth daily. 02/03/20 05/13/20  [provider]  albuterol (VENTOLIN HFA) 108 (90 Base) MCG/ACT inhaler Inhale 2 puffs into the lungs every 6 (six) hours as needed for wheezing or shortness of breath. 04/16/23   Jacquie Maudlin, MD  Atogepant (QULIPTA PO) Take 1 tablet by mouth daily. Patient not taking: Reported on 06/18/2023    [provider]  busPIRone (BUSPAR) 5 MG tablet Take 1 tablet (5 mg total) by mouth 2 (two) times daily 02/25/24     butalbital-acetaminophen-caffeine (FIORICET) 50-325-40 MG tablet Take 2 tablets by mouth every 4 (four) hours as needed for pain Patient not taking: Reported on 06/18/2023 02/19/23   Will Hare, NP  cyclobenzaprine (FLEXERIL) 10 MG tablet Take 1 tablet (10 mg total) by mouth 2 (two) times daily as needed for Muscle spasms 02/21/24   Brimage, Vondra, DO  cyclobenzaprine (FLEXERIL) 10 MG tablet Take 1 tablet (10 mg total) by mouth 2 (two) times daily as needed for Muscle spasms 02/25/24     dihydroergotamine (MIGRANAL) 4 MG/ML nasal spray Place into the nose. Patient not taking: Reported on 06/18/2023 03/11/23   [provider]  Erenumab-aooe (AIMOVIG) 140 MG/ML SOAJ Inject 140 mg into the skin every 28 (twenty-eight) days. 12/24/22     escitalopram (LEXAPRO) 10 MG tablet Take 1 tablet (10 mg total) by mouth once daily Patient taking differently: Take 5 mg by mouth daily.  09/11/23     escitalopram (LEXAPRO) 10 MG tablet Take 1 tablet (10 mg total) by mouth daily. 09/11/23     estradiol (ESTRACE) 0.1 MG/GM vaginal cream SMARTSIG:2 Gram(s) Vaginal Twice a Week Patient not taking: Reported on 06/18/2023    [provider]  estradiol (ESTRACE) 0.1 MG/GM vaginal cream Place 2 g vaginally 2 (two) times a week. Patient not taking: Reported on 06/18/2023 12/15/22     ferrous sulfate 220 (44 Fe) MG/5ML solution Take 220 mg by mouth 2 (two) times daily with a meal. Patient not taking: Reported on 06/18/2023    [provider]  ferrous sulfate 220 (44 Fe) MG/5ML solution Take 5 mLs (220 mg total) by mouth 2 (two) times daily. 04/17/23     ferrous sulfate 220 (44 Fe) MG/5ML solution Take 5 mLs (220 mg total) by mouth 2 (two) times daily 10/08/23  Galcanezumab-gnlm (EMGALITY) 120 MG/ML SOAJ Inject 120 mg into the skin every 28 (twenty-eight) days. 10/19/23     HYDROcodone-acetaminophen (NORCO) 5-325 MG tablet Take 1 tablet by mouth every 6 (six) hours as needed for up to 6 doses for moderate pain. Patient not taking: Reported on 06/18/2023 11/06/22   Sakai, Isami, DO  hydrocortisone 2.5 % ointment Apply 1 Application topically 2 (two) times daily  for 7 days. Patient not taking: Reported on 06/18/2023 01/21/23     hydrOXYzine (ATARAX) 10 MG tablet Take 1 tablet (10 mg total) by mouth 3 (three) times daily as needed for anxiety. 02/23/24   Collis Deaner, MD  ibuprofen (ADVIL) 600 MG tablet Take 1 tablet (600 mg total) by mouth every 4 (four) - 6 (six) hours. 12/03/23     Lasmiditan Succinate 100 MG TABS Take 1 tablet (100 mg total) by mouth as needed for migraine rescue. No more than 1 tablet every 24 hours. 10/19/23     magic mouthwash (nystatin, lidocaine, diphenhydrAMINE, alum & mag hydroxide) suspension Swish and spit 5 mLs 4 (four) times daily as needed for mouth pain. Patient not taking: Reported on 06/18/2023 12/16/22   Reena Canning, NP  metFORMIN (GLUCOPHAGE-XR)  500 MG 24 hr tablet Take 1 tablet (500 mg total) by mouth daily with supper. 02/25/24     ondansetron (ZOFRAN) 4 MG tablet Take 1 tablet (4 mg total) by mouth every 8 (eight) hours as needed. 04/16/23   Jacquie Maudlin, MD  ondansetron (ZOFRAN) 4 MG tablet Take 1 tablet (4 mg total) by mouth every 8 (eight) hours as needed. 10/19/23     Plecanatide (TRULANCE) 3 MG TABS Take 1 tablet (3 mg total) by mouth daily. 05/14/23 05/08/24  Brigitte Canard, PA-C  predniSONE (STERAPRED UNI-PAK 21 TAB) 10 MG (21) TBPK tablet Take by mouth daily. Take 6 tabs by mouth daily for 1, then 5 tabs for 1 day, then 4 tabs for 1 day, then 3 tabs for 1 day, then 2 tabs for 1 day, then 1 tab for 1 day. 02/21/24   Brimage, Vondra, DO  Rimegepant Sulfate (NURTEC) 75 MG TBDP Take 1 tablet (75 mg total) by mouth as directed at onset of migraine once a day as needed. 11/17/23     valACYclovir (VALTREX) 1000 MG tablet Take 1 tablet (1,000 mg total) by mouth 2 (two) times daily for 7days 10/08/23     linaclotide (LINZESS) 72 MCG capsule Take 1 capsule (72 mcg total) by mouth daily. 02/19/23 04/23/23    XULANE 150-35 MCG/24HR transdermal patch 1 patch once a week. 09/04/19 11/18/19  [provider]    Family History Family History  Problem Relation Age of Onset   Hypertension Mother    Alcohol abuse Mother    Liver disease Mother    Other Father        unknown medical history   Hypertension Maternal Grandmother    Diabetes Maternal Grandmother    Dementia Maternal Grandmother    Autoimmune disease Daughter     Social History Social History   Tobacco Use   Smoking status: Never   Smokeless tobacco: Never  Vaping Use   Vaping status: Never Used  Substance Use Topics   Alcohol use: Yes    Alcohol/week: 1.0 standard drink of alcohol    Types: 1 Glasses of wine per week    Comment: occ wine   Drug use: No     Allergies   Vicodin [hydrocodone-acetaminophen]   Review of  Systems Review of Systems  Constitutional:   Negative for fever.  HENT:  Positive for dental problem.      Physical Exam Triage Vital Signs ED Triage Vitals  Encounter Vitals Group     BP 02/27/24 0815 (!) 142/83     Systolic BP Percentile --      Diastolic BP Percentile --      Pulse Rate 02/27/24 0815 87     Resp 02/27/24 0815 14     Temp 02/27/24 0815 98 F (36.7 C)     Temp Source 02/27/24 0815 Oral     SpO2 02/27/24 0815 98 %     Weight 02/27/24 0813 190 lb 0.6 oz (86.2 kg)     Height 02/27/24 0813 5' (1.524 m)     Head Circumference --      Peak Flow --      Pain Score 02/27/24 0813 10     Pain Loc --      Pain Education --      Exclude from Growth Chart --    No data found.  Updated Vital Signs BP (!) 142/83 (BP Location: Right Arm)   Pulse 87   Temp 98 F (36.7 C) (Oral)   Resp 14   Ht 5' (1.524 m)   Wt 190 lb 0.6 oz (86.2 kg)   LMP 10/27/2021 (Exact Date) Comment: having depot shots  SpO2 98%   BMI 37.11 kg/m   Visual Acuity Right Eye Distance:   Left Eye Distance:   Bilateral Distance:    Right Eye Near:   Left Eye Near:    Bilateral Near:     Physical Exam Vitals and nursing note reviewed.  Constitutional:      Appearance: Normal appearance. She is not ill-appearing.  HENT:     Head: Normocephalic and atraumatic.     Mouth/Throat:     Mouth: Mucous membranes are moist.     Pharynx: Oropharynx is clear. Posterior oropharyngeal erythema present. No oropharyngeal exudate.     Comments: Gum tissue is erythematous with a visible pus collection behind the right upper canine. Skin:    General: Skin is warm and dry.     Capillary Refill: Capillary refill takes less than 2 seconds.  Neurological:     General: No focal deficit present.     Mental Status: She is alert and oriented to person, place, and time.      UC Treatments / Results  Labs (all labs ordered are listed, but only abnormal results are displayed) Labs Reviewed - No data to display  EKG   Radiology No results  found.  Procedures Procedures (including critical care time)  Medications Ordered in UC Medications - No data to display  Initial Impression / Assessment and Plan / UC Course  I have reviewed the triage vital signs and the nursing notes.  Pertinent labs & imaging results that were available during my care of the patient were reviewed by me and considered in my medical decision making (see chart for details).   Patient is a pleasant, nontoxic-appearing 43 year old female presenting for evaluation of dental pain as outlined HPI above.  As you can see in image above, there is mild swelling to the superior aspect of the patient's right cheek.  This area is tender to palpation but there is no induration or fluctuance.  As you can see in image above, there is a visible bleb on the gum tissue behind the first molar that has a yellow head  on it indicating a possible pus collection.  This area is tender to touch.  I will treat the patient for a dental abscess with Augmentin 875 mg twice daily for 7 days, have her continue rotating Tylenol and ibuprofen for the pain, have her continue salt water gargles and rinses to soothe the gum tissue, and we discussed using food grade clove oil or whole close apply directly to the area of pain to help with discomfort.  She will follow-up with her dentist on Monday.  Final Clinical Impressions(s) / UC Diagnoses   Final diagnoses:  Dental abscess     Discharge Instructions      Take the Augmentin 875 mg twice daily with food for 7 days for treatment of your dental abscess.  Continue your salt water rinses to help soothe the gum tissue and possibly bring the abscess to ahead and allow it to rupture on its own.  If the abscess ruptures, do not swallow the pus, spit it out.  You should also continue salt water gargles if the abscess ruptures to prevent food collection in the pocket.  Continue rotating Tylenol and/or ibuprofen as needed for pain.  As we  discussed, you can use food grade clove oil, or a whole clove, apply directly to the gum tissue that is sore.  This has a topical, local anesthetic effect.  Keep your follow-up appointment with your dentist on Monday.  If you develop any worsening pain, facial swelling, or fevers I recommend you go to [facility] where you can be evaluated by the dentist or oral surgeon on-call.     ED Prescriptions     Medication Sig Dispense Auth. Provider   amoxicillin-clavulanate (AUGMENTIN) 875-125 MG tablet Take 1 tablet by mouth every 12 (twelve) hours for 7 days. 14 tablet Kent Pear, NP      PDMP not reviewed this encounter.   Kent Pear, NP 02/27/24 [phone]

## 2024-02-27 NOTE — ED Triage Notes (Signed)
 Patient states that she had a tooth extracted 2-3 weeks ago.  Patient reports dental pain and right sided facial swelling that started Thursday.

## 2024-03-04 ENCOUNTER — Other Ambulatory Visit: Payer: Self-pay

## 2024-03-07 ENCOUNTER — Other Ambulatory Visit: Payer: Self-pay

## 2024-03-07 MED ORDER — FLUCONAZOLE 150 MG PO TABS
150.0000 mg | ORAL_TABLET | Freq: Once | ORAL | 0 refills | Status: AC
  Filled 2024-03-07: qty 1, 1d supply, fill #0

## 2024-03-17 ENCOUNTER — Other Ambulatory Visit: Payer: Self-pay

## 2024-03-17 DIAGNOSIS — M2201 Recurrent dislocation of patella, right knee: Secondary | ICD-10-CM | POA: Diagnosis not present

## 2024-03-17 DIAGNOSIS — G5603 Carpal tunnel syndrome, bilateral upper limbs: Secondary | ICD-10-CM | POA: Diagnosis not present

## 2024-03-21 DIAGNOSIS — G5601 Carpal tunnel syndrome, right upper limb: Secondary | ICD-10-CM | POA: Diagnosis not present

## 2024-03-21 DIAGNOSIS — G43109 Migraine with aura, not intractable, without status migrainosus: Secondary | ICD-10-CM | POA: Diagnosis not present

## 2024-03-21 DIAGNOSIS — R300 Dysuria: Secondary | ICD-10-CM | POA: Diagnosis not present

## 2024-03-21 DIAGNOSIS — M7918 Myalgia, other site: Secondary | ICD-10-CM | POA: Diagnosis not present

## 2024-03-23 ENCOUNTER — Other Ambulatory Visit: Payer: Self-pay

## 2024-04-07 ENCOUNTER — Other Ambulatory Visit: Payer: Self-pay

## 2024-04-07 DIAGNOSIS — R600 Localized edema: Secondary | ICD-10-CM | POA: Diagnosis not present

## 2024-04-07 MED ORDER — IBUPROFEN 600 MG PO TABS
600.0000 mg | ORAL_TABLET | ORAL | 0 refills | Status: AC
  Filled 2024-04-07: qty 10, 2d supply, fill #0
  Filled 2024-07-07: qty 10, 3d supply, fill #0

## 2024-04-08 ENCOUNTER — Other Ambulatory Visit: Payer: Self-pay

## 2024-04-08 MED ORDER — FUROSEMIDE 20 MG PO TABS
20.0000 mg | ORAL_TABLET | Freq: Every day | ORAL | 0 refills | Status: DC
  Filled 2024-04-08: qty 30, 30d supply, fill #0

## 2024-04-13 ENCOUNTER — Other Ambulatory Visit: Payer: Self-pay

## 2024-04-13 ENCOUNTER — Ambulatory Visit
Admission: EM | Admit: 2024-04-13 | Discharge: 2024-04-13 | Disposition: A | Discharge status: Home / Self Care | Attending: Family Medicine | Admitting: Family Medicine

## 2024-04-13 ENCOUNTER — Encounter: Payer: Self-pay | Admitting: Pharmacist

## 2024-04-13 DIAGNOSIS — L299 Pruritus, unspecified: Secondary | ICD-10-CM | POA: Diagnosis not present

## 2024-04-13 MED ORDER — PERMETHRIN 5 % EX CREA: TOPICAL_CREAM | CUTANEOUS | 0 refills | Status: DC

## 2024-04-13 MED ORDER — HYDROXYZINE HCL 25 MG PO TABS: 25.0000 mg | ORAL_TABLET | Freq: Three times a day (TID) | ORAL | 0 refills | Status: AC | PRN

## 2024-04-13 MED ORDER — TRIAMCINOLONE ACETONIDE 0.1 % EX OINT: 1.0000 | TOPICAL_OINTMENT | Freq: Two times a day (BID) | CUTANEOUS | 0 refills | Status: DC

## 2024-04-13 MED ORDER — FERROUS SULFATE 220 (44 FE) MG/5ML PO SOLN
220.0000 mg | Freq: Two times a day (BID) | ORAL | 1 refills | Status: AC
  Filled 2024-04-13 – 2024-04-25 (×3): qty 473, 47d supply, fill #0
  Filled 2024-07-07 – 2024-10-12 (×4): qty 473, 47d supply, fill #1

## 2024-04-13 NOTE — ED Triage Notes (Signed)
 Sx x 2 days  Itching all over body. No visible rash. Using cream and benadryl  with no relief. Patient used some old hydrocortisone  cream that seemed to help some.

## 2024-04-13 NOTE — ED Provider Notes (Signed)
 [facility]    CSN: [id number] Arrival date & time: 04/13/24  7829      History   Chief Complaint Chief Complaint  Patient presents with   Pruritis    HPI Cindy Hays is a 43 y.o. female.   HPI  Cindy Hays presents for itching throughout her body that started 2 days. Took benadryl  and hydrocortisone  cream that help somewhat.  She sleeps on her mattress on the floor.  Itching gets worse at night.  She did notice that she had some bites about 2 weeks ago on her right arm.  Says that her sons and occasionally her daughter will say they are itching but only after she says she is itching.  She has been using a new body spray.  There is been no new soaps and detergents.  No eye irritation, sore throat, difficulty breathing, nausea, vomiting or diarrhea.  Denies belly pain, joint pain and fever.  There has been no medication changes or new supplements.  Denies any new foods or drinks.     Past Medical History:  Diagnosis Date   Anemia    Arthritis    B12 deficiency    Cervical lymphadenopathy 03/26/2010   Contraceptive management 01/19/2008   Dysuria 11/13/2014   Fatigue 06/15/2009   Gallstones 10/2022   High-risk pregnancy 06/27/2017   43 y.o. [id number] at [redacted]w[redacted]d by  LMP c/w  week ultrasound  Sex of baby and name: boy " "   Factors complicating this pregnancy   1. Hx IUFD last pregnancy at 26.4wks (10/2017)   MFM referral placed- seen by MFM 5/2; anti-beta 2 glycoprotein - negative   Plan q4wk growth US    antepartum testing nst  2x/ week + weekly NST   2. CHTN on  meds since postpartum last delivery 11/2017- currently taking Pro   History of gestational hypertension    Hypertension    Migraine    Nipple discharge 11/13/2014   Papanicolaou smear of cervix with low grade squamous intraepithelial lesion (LGSIL) 11/16/2014   Pregnancy test positive 06/15/2017   Preterm labor    Uterine leiomyoma     Patient Active Problem List   Diagnosis Date Noted    Complicated migraine 10/20/2023   Polyp of descending colon 06/25/2023   Excessive and frequent menstruation 05/12/2023   History of uterine fibroid 05/12/2023   Carpal tunnel syndrome of right wrist 03/12/2021   NSVT (nonsustained ventricular tachycardia) (HCC)    Chronic headaches 02/06/2020   Recurrent syncope 02/06/2020   Occipital neuralgia 02/06/2020   Sinus tachycardia 02/06/2020   ANA positive 02/06/2020   Syncope 02/06/2020   Intractable menstrual migraine without status migrainosus 01/31/2020   Iron deficiency anemia 10/28/2019   Pregnancy 08/04/2018   Indication for care in labor or delivery 08/03/2018   Labor and delivery indication for care or intervention 08/02/2018   Chronic hypertension with superimposed pre-eclampsia 07/30/2018   Chronic hypertension with superimposed preeclampsia 07/21/2018   Short interval between pregnancies affecting pregnancy in first trimester, antepartum 03/18/2018   Advanced maternal age in multigravida, first trimester 03/18/2018   Hypertension 11/17/2017   Fetal demise, greater than 22 weeks, antepartum, single gestation 11/13/2017   Hx of preeclampsia, prior pregnancy, currently pregnant 10/22/2017   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   Herpes simplex 07/29/2017   Obesity (BMI 35.0-39.9 without comorbidity) 07/23/2017    Past Surgical History:  Procedure Laterality Date   COLONOSCOPY WITH PROPOFOL  N/A 06/25/2023   Procedure: COLONOSCOPY WITH PROPOFOL ;  Surgeon: Marnee Sink, MD;  Location: [facility];  Service: Endoscopy;  Laterality: N/A;   ESOPHAGOGASTRODUODENOSCOPY (EGD) WITH PROPOFOL  N/A 06/25/2023   Procedure: ESOPHAGOGASTRODUODENOSCOPY (EGD) WITH PROPOFOL ;  Surgeon: Marnee Sink, MD;  Location: [facility];  Service: Endoscopy;  Laterality: N/A;   POLYPECTOMY  06/25/2023   Procedure: POLYPECTOMY;  Surgeon: Marnee Sink, MD;  Location: [facility];  Service: Endoscopy;;   ROBOTIC ASSISTED LAPAROSCOPIC HYSTERECTOMY AND  SALPINGECTOMY Bilateral 12/20/2021   Procedure: XI ROBOTIC ASSISTED LAPAROSCOPIC HYSTERECTOMY AND SALPINGECTOMY;  Surgeon: Prescilla Brod, MD;  Location: [facility];  Service: Gynecology;  Laterality: Bilateral;    OB History     Gravida  6   Para  6   Term  4   Preterm  1   AB      Living  5      SAB      IAB      Ectopic      Multiple  0   Live Births  1            Home Medications    Prior to Admission medications   Medication Sig Start Date End Date Taking? Authorizing Provider  furosemide (LASIX) 20 MG tablet Take 1 tablet (20 mg total) by mouth daily. 04/08/24  Yes   Galcanezumab -gnlm (EMGALITY ) 120 MG/ML SOAJ Inject 120 mg into the skin every 28 (twenty-eight) days. 10/19/23  Yes   metFORMIN  (GLUCOPHAGE -XR) 500 MG 24 hr tablet Take 1 tablet (500 mg total) by mouth daily with supper. 02/25/24  Yes   permethrin (ELIMITE) 5 % cream Apply cream from head to feet, leave on for 8 to 14 hours, then wash with soap/water , repeat application if living mites present 14 days after initial treatment 04/13/24  Yes Duy Lemming, DO  triamcinolone  ointment (KENALOG) 0.1 % Apply 1 Application topically 2 (two) times daily. 04/13/24  Yes Lacy Sofia, DO  loratadine  (CLARITIN ) 10 MG tablet Take 10 mg by mouth daily. 02/03/20 05/13/20  [provider]  albuterol  (VENTOLIN  HFA) 108 (90 Base) MCG/ACT inhaler Inhale 2 puffs into the lungs every 6 (six) hours as needed for wheezing or shortness of breath. 04/16/23   Jacquie Maudlin, MD  busPIRone  (BUSPAR ) 5 MG tablet Take 1 tablet (5 mg total) by mouth 2 (two) times daily 02/25/24     butalbital -acetaminophen -caffeine  (FIORICET ) 50-325-40 MG tablet Take 2 tablets by mouth every 4 (four) hours as needed for pain Patient not taking: Reported on 06/18/2023 02/19/23   Will Hare, NP  cyclobenzaprine  (FLEXERIL ) 10 MG tablet Take 1 tablet (10 mg total) by mouth 2 (two) times daily as needed for Muscle spasms 02/21/24   Gwendalynn Eckstrom,  Huzaifa Viney, DO  cyclobenzaprine  (FLEXERIL ) 10 MG tablet Take 1 tablet (10 mg total) by mouth 2 (two) times daily as needed for Muscle spasms 02/25/24     dihydroergotamine  (MIGRANAL ) 4 MG/ML nasal spray Place into the nose. Patient not taking: Reported on 06/18/2023 03/11/23   [provider]  Erenumab -aooe (AIMOVIG ) 140 MG/ML SOAJ Inject 140 mg into the skin every 28 (twenty-eight) days. 12/24/22     escitalopram  (LEXAPRO ) 10 MG tablet Take 1 tablet (10 mg total) by mouth once daily Patient taking differently: Take 5 mg by mouth daily. 09/11/23     escitalopram  (LEXAPRO ) 10 MG tablet Take 1 tablet (10 mg total) by mouth daily. 09/11/23     estradiol  (ESTRACE ) 0.1 MG/GM vaginal cream SMARTSIG:2 Gram(s) Vaginal Twice a Week Patient not taking: Reported on 06/18/2023    [provider]  estradiol  (ESTRACE ) 0.1 MG/GM vaginal cream Place 2 g vaginally 2 (two) times a week. Patient not taking: Reported on 06/18/2023 12/15/22     ferrous sulfate  220 (44 Fe) MG/5ML solution Take 220 mg by mouth 2 (two) times daily with a meal. Patient not taking: Reported on 06/18/2023    [provider]  ferrous sulfate  220 (44 Fe) MG/5ML solution Take 5 mLs (220 mg total) by mouth 2 (two) times daily. 04/17/23     ferrous sulfate  220 (44 Fe) MG/5ML solution Take 5 mLs (220 mg total) by mouth 2 (two) times daily 10/08/23     HYDROcodone -acetaminophen  (NORCO) 5-325 MG tablet Take 1 tablet by mouth every 6 (six) hours as needed for up to 6 doses for moderate pain. Patient not taking: Reported on 06/18/2023 11/06/22   Sakai, Isami, DO  hydrocortisone  2.5 % ointment Apply 1 Application topically 2 (two) times daily  for 7 days. Patient not taking: Reported on 06/18/2023 01/21/23     hydrOXYzine  (ATARAX ) 25 MG tablet Take 1 tablet (25 mg total) by mouth 3 (three) times daily as needed for anxiety. 04/13/24   Raijon Lindfors, DO  ibuprofen  (ADVIL ) 600 MG tablet Take 1 tablet (600 mg total) by mouth every 4 (four) - 6  (six) hours. 12/03/23     ibuprofen  (ADVIL ) 600 MG tablet Take 1 tablet (600mg ) by mouth every 4 to 6 hours. 04/07/24     Lasmiditan  Succinate 100 MG TABS Take 1 tablet (100 mg total) by mouth as needed for migraine rescue. No more than 1 tablet every 24 hours. 10/19/23     ondansetron  (ZOFRAN ) 4 MG tablet Take 1 tablet (4 mg total) by mouth every 8 (eight) hours as needed. 04/16/23   Jacquie Maudlin, MD  ondansetron  (ZOFRAN ) 4 MG tablet Take 1 tablet (4 mg total) by mouth every 8 (eight) hours as needed. 10/19/23     Plecanatide  (TRULANCE ) 3 MG TABS Take 1 tablet (3 mg total) by mouth daily. 05/14/23 05/08/24  Brigitte Canard, PA-C  predniSONE  (STERAPRED UNI-PAK 21 TAB) 10 MG (21) TBPK tablet Take by mouth daily. Take 6 tabs by mouth daily for 1, then 5 tabs for 1 day, then 4 tabs for 1 day, then 3 tabs for 1 day, then 2 tabs for 1 day, then 1 tab for 1 day. 02/21/24   Gicela Schwarting, DO  Rimegepant Sulfate  (NURTEC) 75 MG TBDP Take 1 tablet (75 mg total) by mouth as directed at onset of migraine once a day as needed. 11/17/23     valACYclovir  (VALTREX ) 1000 MG tablet Take 1 tablet (1,000 mg total) by mouth 2 (two) times daily for 7days 10/08/23     linaclotide  (LINZESS ) 72 MCG capsule Take 1 capsule (72 mcg total) by mouth daily. 02/19/23 04/23/23    XULANE 150-35 MCG/24HR transdermal patch 1 patch once a week. 09/04/19 11/18/19  [provider]    Family History Family History  Problem Relation Age of Onset   Hypertension Mother    Alcohol abuse Mother    Liver disease Mother    Other Father        unknown medical history   Hypertension Maternal Grandmother    Diabetes Maternal Grandmother    Dementia Maternal Grandmother    Autoimmune disease Daughter     Social History Social History   Tobacco Use   Smoking status: Never   Smokeless tobacco: Never  Vaping Use   Vaping status: Never Used  Substance Use Topics   Alcohol  use: Yes    Alcohol/week: 1.0 standard drink of alcohol     Types: 1 Glasses of wine per week    Comment: occ wine   Drug use: No     Allergies   Vicodin [hydrocodone -acetaminophen ]   Review of Systems Review of Systems :negative unless otherwise stated in HPI.      Physical Exam Triage Vital Signs ED Triage Vitals  Encounter Vitals Group     BP 04/13/24 0832 131/82     Systolic BP Percentile --      Diastolic BP Percentile --      Pulse Rate 04/13/24 0832 91     Resp 04/13/24 0832 17     Temp 04/13/24 0832 98.2 F (36.8 C)     Temp Source 04/13/24 0832 Oral     SpO2 04/13/24 0832 98 %     Weight --      Height --      Head Circumference --      Peak Flow --      Pain Score 04/13/24 0830 0     Pain Loc --      Pain Education --      Exclude from Growth Chart --    No data found.  Updated Vital Signs BP 131/82 (BP Location: Right Arm)   Pulse 91   Temp 98.2 F (36.8 C) (Oral)   Resp 17   LMP 10/27/2021 (Exact Date) Comment: having depot shots  SpO2 98%   Visual Acuity Right Eye Distance:   Left Eye Distance:   Bilateral Distance:    Right Eye Near:   Left Eye Near:    Bilateral Near:     Physical Exam  GEN: alert, well appearing female, in no acute distress  EYES: no scleral injection or discharge CV: regular rate, brisk cap refill RESP: no increased work of breathing MSK: no extremity edema  NEURO: alert, moves all extremities appropriately SKIN: warm and dry; erythematous patch without warmth on the lateral epicondyle of her right elbow, scaly hyperpigmented lesion on the top of her right shoulder, no rash on lower extremities, palms or soles   UC Treatments / Results  Labs (all labs ordered are listed, but only abnormal results are displayed) Labs Reviewed - No data to display  EKG   Radiology No results found.  Procedures Procedures (including critical care time)  Medications Ordered in UC Medications - No data to display  Initial Impression / Assessment and Plan / UC Course  I have  reviewed the triage vital signs and the nursing notes.  Pertinent labs & imaging results that were available during my care of the patient were reviewed by me and considered in my medical decision making (see chart for details).     Patient is a 43 y.o. femalewho presents for nocturnal pruritus.  Overall, patient is well-appearing and well-hydrated.  Vital signs stable.  Cindy Hays is afebrile.  History is concerning for possible scabies versus insect bites given the itching occurs only at night and she is the only one itching.  Exam unfortunately leans towards bug bites however will treat with permethrin  for possible scabies.  Given Kenalog  ointment for itching.  Hydroxyzine  for itching and to allow her to sleep. No sign of infection to suggest antibiotics or antifungals at this time.  Not likely viral exanthem.    Reviewed expectations regarding course of current medical issues.  All questions asked were answered.  Outlined signs and symptoms indicating need for more acute intervention.  Patient verbalized understanding. After Visit Summary given.   Final Clinical Impressions(s) / UC Diagnoses   Final diagnoses:  Pruritus     Discharge Instructions      For itching: apply ointment 2 times a day as needed. Take hydroxyzine  for itching. Two tablets can be taken at night to help you sleep.   For possible scabies treatment: Apply cream from head to feet, leave on for 8 to 14 hours, then wash with soap/water , repeat application if living mites present 14 days after initial treatment   Regarding possible bed bugs:  The best way to get rid of bedbugs is to call a professional pest control company. They use special pesticides and other equipment to kill the bugs and their eggs.  If you decide to buy your own pesticide, check the label. Make sure it says that it is for bedbugs. Be sure to follow the directions carefully. You may have to use the pesticide more than once.  Do other cleaning steps  such as:  Vacuum often. Be sure to empty the vacuum after each use. If you use a vacuum bag, seal it and throw it out in an outdoor trash can. If you don't use a vacuum bag, empty the container and clean it with hot, soapy water . Launder things that might hide bugs. Washing and then drying items in a dryer on a hot setting is adequate to kill bedbugs in clothing or linens. Turn the dryer to the hottest setting that the fabric can handle. Use mattress, box spring, and pillow (encasement) sacks to trap bed bugs and help get rid of them. Be sure to follow the directions on the package.   ED Prescriptions     Medication Sig Dispense Auth. Provider   hydrOXYzine  (ATARAX ) 25 MG tablet Take 1 tablet (25 mg total) by mouth 3 (three) times daily as needed for anxiety. 30 tablet Cindy Beers, DO   triamcinolone  ointment (KENALOG ) 0.1 % Apply 1 Application topically 2 (two) times daily. 30 g Cindy Mcmillen, DO   permethrin  (ELIMITE ) 5 % cream Apply cream from head to feet, leave on for 8 to 14 hours, then wash with soap/water , repeat application if living mites present 14 days after initial treatment 60 g Cindy Norlander, DO      PDMP not reviewed this encounter.              Cindy Bolding, DO 04/13/24 1252

## 2024-04-13 NOTE — Discharge Instructions (Addendum)
 For itching: apply ointment 2 times a day as needed. Take hydroxyzine  for itching. Two tablets can be taken at night to help you sleep.   For possible scabies treatment: Apply cream from head to feet, leave on for 8 to 14 hours, then wash with soap/water , repeat application if living mites present 14 days after initial treatment   Regarding possible bed bugs:  The best way to get rid of bedbugs is to call a professional pest control company. They use special pesticides and other equipment to kill the bugs and their eggs.  If you decide to buy your own pesticide, check the label. Make sure it says that it is for bedbugs. Be sure to follow the directions carefully. You may have to use the pesticide more than once.  Do other cleaning steps such as:  Vacuum often. Be sure to empty the vacuum after each use. If you use a vacuum bag, seal it and throw it out in an outdoor trash can. If you don't use a vacuum bag, empty the container and clean it with hot, soapy water . Launder things that might hide bugs. Washing and then drying items in a dryer on a hot setting is adequate to kill bedbugs in clothing or linens. Turn the dryer to the hottest setting that the fabric can handle. Use mattress, box spring, and pillow (encasement) sacks to trap bed bugs and help get rid of them. Be sure to follow the directions on the package.

## 2024-04-14 ENCOUNTER — Other Ambulatory Visit: Payer: Self-pay

## 2024-04-18 ENCOUNTER — Other Ambulatory Visit: Payer: Self-pay

## 2024-04-22 ENCOUNTER — Encounter: Payer: Self-pay | Admitting: Emergency Medicine

## 2024-04-22 ENCOUNTER — Ambulatory Visit
Admission: EM | Admit: 2024-04-22 | Discharge: 2024-04-22 | Disposition: A | Discharge status: Home / Self Care | Attending: Family Medicine | Admitting: Family Medicine

## 2024-04-22 ENCOUNTER — Other Ambulatory Visit: Payer: Self-pay

## 2024-04-22 ENCOUNTER — Emergency Department
Admission: EM | Admit: 2024-04-22 | Discharge: 2024-04-22 | Disposition: A | Discharge status: Home / Self Care | Source: Home / Self Care

## 2024-04-22 DIAGNOSIS — R0781 Pleurodynia: Secondary | ICD-10-CM | POA: Diagnosis not present

## 2024-04-22 DIAGNOSIS — R071 Chest pain on breathing: Secondary | ICD-10-CM | POA: Diagnosis not present

## 2024-04-22 DIAGNOSIS — Z885 Allergy status to narcotic agent status: Secondary | ICD-10-CM | POA: Diagnosis not present

## 2024-04-22 DIAGNOSIS — R079 Chest pain, unspecified: Secondary | ICD-10-CM

## 2024-04-22 DIAGNOSIS — E119 Type 2 diabetes mellitus without complications: Secondary | ICD-10-CM | POA: Diagnosis not present

## 2024-04-22 DIAGNOSIS — F419 Anxiety disorder, unspecified: Secondary | ICD-10-CM | POA: Diagnosis not present

## 2024-04-22 DIAGNOSIS — D509 Iron deficiency anemia, unspecified: Secondary | ICD-10-CM | POA: Diagnosis not present

## 2024-04-22 NOTE — ED Triage Notes (Signed)
 Patient states that she was at her son's Graduation today and started having left sided back pain that radiated to the left of her chest with SOB that started around 3:45pm.  Patient was seen at [facility] but left and came here.  Patient reports some nausea.  Patient denies vomiting.  Patient reports some numbness and tingling.

## 2024-04-22 NOTE — ED Notes (Signed)
 Patient is being discharged from the Urgent Care and sent to the [facility] Emergency Department via private vehicle with daughter . Per Ramonita Burow, NP, patient is in need of higher level of care due to chest pain. Patient is aware and verbalizes understanding of plan of care.  Vitals:   04/22/24 1808  BP: 130/85  Pulse: (!) 112  Resp: 14  Temp: 99 F (37.2 C)  SpO2: 100%

## 2024-04-22 NOTE — Discharge Instructions (Addendum)
 Pt to ER for further evaluation of chest pain

## 2024-04-22 NOTE — ED Provider Notes (Addendum)
 [facility]    CSN: [id number] Arrival date & time: 04/22/24  1803      History   Chief Complaint Chief Complaint  Patient presents with   Back Pain   Chest Pain    HPI Cindy Hays is a 43 y.o. female with a past medical history of migraines, HLP, SVT, and prediabetes who presents for chest pain.  Patient reports 3 hours ago while at her son's graduation she developed a left sided back pain that wrapped around to her mid sternal chest.  States since then it has been a constant midsternal chest pressure that is not radiating.  It is associated with dizziness, shortness of breath, nausea and indigestion.  She denies any syncope or palpitations.  She does states symptoms are better with rest and worse with exertion.  She did take a hydroxyzine  thinking it was her anxiety but had no change in her symptoms.  She also took an over-the-counter antacid with no change in symptoms.  She denies any history of asthma or smoking.  No recent respiratory illnesses.  Reports a family history of hypertension but no known CAD. Reports a hx of pregnancy induced HTN only.  She was at [facility] but left without being seen. No other concerns at this time.   Back Pain Associated symptoms: chest pain   Chest Pain Associated symptoms: back pain, dizziness, nausea and shortness of breath     Past Medical History:  Diagnosis Date   Anemia    Arthritis    B12 deficiency    Cervical lymphadenopathy 03/26/2010   Contraceptive management 01/19/2008   Dysuria 11/13/2014   Fatigue 06/15/2009   Gallstones 10/2022   High-risk pregnancy 06/27/2017   43 y.o. [id number] at [redacted]w[redacted]d by  LMP c/w  week ultrasound  Sex of baby and name: boy " "   Factors complicating this pregnancy   1. Hx IUFD last pregnancy at 26.4wks (10/2017)   MFM referral placed- seen by MFM 5/2; anti-beta 2 glycoprotein - negative   Plan q4wk growth US    antepartum testing nst  2x/ week + weekly NST   2. CHTN on  meds since postpartum  last delivery 11/2017- currently taking Pro   History of gestational hypertension    Hypertension    Migraine    Nipple discharge 11/13/2014   Papanicolaou smear of cervix with low grade squamous intraepithelial lesion (LGSIL) 11/16/2014   Pregnancy test positive 06/15/2017   Preterm labor    Uterine leiomyoma     Patient Active Problem List   Diagnosis Date Noted   Complicated migraine 10/20/2023   Polyp of descending colon 06/25/2023   Excessive and frequent menstruation 05/12/2023   History of uterine fibroid 05/12/2023   Carpal tunnel syndrome of right wrist 03/12/2021   NSVT (nonsustained ventricular tachycardia) (HCC)    Chronic headaches 02/06/2020   Recurrent syncope 02/06/2020   Occipital neuralgia 02/06/2020   Sinus tachycardia 02/06/2020   ANA positive 02/06/2020   Syncope 02/06/2020   Intractable menstrual migraine without status migrainosus 01/31/2020   Iron deficiency anemia 10/28/2019   Pregnancy 08/04/2018   Indication for care in labor or delivery 08/03/2018   Labor and delivery indication for care or intervention 08/02/2018   Chronic hypertension with superimposed pre-eclampsia 07/30/2018   Chronic hypertension with superimposed preeclampsia 07/21/2018   Short interval between pregnancies affecting pregnancy in first trimester, antepartum 03/18/2018   Advanced maternal age in multigravida, first trimester 03/18/2018   Hypertension 11/17/2017   Fetal demise, greater than  22 weeks, antepartum, single gestation 11/13/2017   Hx of preeclampsia, prior pregnancy, currently pregnant 10/22/2017   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   Herpes simplex 07/29/2017   Obesity (BMI 35.0-39.9 without comorbidity) 07/23/2017    Past Surgical History:  Procedure Laterality Date   COLONOSCOPY WITH PROPOFOL  N/A 06/25/2023   Procedure: COLONOSCOPY WITH PROPOFOL ;  Surgeon: Marnee Sink, MD;  Location: [facility];  Service: Endoscopy;  Laterality: N/A;   ESOPHAGOGASTRODUODENOSCOPY  (EGD) WITH PROPOFOL  N/A 06/25/2023   Procedure: ESOPHAGOGASTRODUODENOSCOPY (EGD) WITH PROPOFOL ;  Surgeon: Marnee Sink, MD;  Location: [facility];  Service: Endoscopy;  Laterality: N/A;   POLYPECTOMY  06/25/2023   Procedure: POLYPECTOMY;  Surgeon: Marnee Sink, MD;  Location: [facility];  Service: Endoscopy;;   ROBOTIC ASSISTED LAPAROSCOPIC HYSTERECTOMY AND SALPINGECTOMY Bilateral 12/20/2021   Procedure: XI ROBOTIC ASSISTED LAPAROSCOPIC HYSTERECTOMY AND SALPINGECTOMY;  Surgeon: Prescilla Brod, MD;  Location: [facility];  Service: Gynecology;  Laterality: Bilateral;    OB History     Gravida  6   Para  6   Term  4   Preterm  1   AB      Living  5      SAB      IAB      Ectopic      Multiple  0   Live Births  1            Home Medications    Prior to Admission medications   Medication Sig Start Date End Date Taking? Authorizing Provider  hydrOXYzine  (ATARAX ) 25 MG tablet Take 1 tablet by mouth 3 (three) times daily as needed.   Yes [provider]  loratadine  (CLARITIN ) 10 MG tablet Take 10 mg by mouth daily. 02/03/20 05/13/20  [provider]  albuterol  (VENTOLIN  HFA) 108 (90 Base) MCG/ACT inhaler Inhale 2 puffs into the lungs every 6 (six) hours as needed for wheezing or shortness of breath. 04/16/23   Jacquie Maudlin, MD  busPIRone  (BUSPAR ) 5 MG tablet Take 1 tablet (5 mg total) by mouth 2 (two) times daily 02/25/24     butalbital -acetaminophen -caffeine  (FIORICET ) 50-325-40 MG tablet Take 2 tablets by mouth every 4 (four) hours as needed for pain Patient not taking: Reported on 06/18/2023 02/19/23   Will Hare, NP  cyclobenzaprine  (FLEXERIL ) 10 MG tablet Take 1 tablet (10 mg total) by mouth 2 (two) times daily as needed for Muscle spasms 02/21/24   Brimage, Vondra, DO  cyclobenzaprine  (FLEXERIL ) 10 MG tablet Take 1 tablet (10 mg total) by mouth 2 (two) times daily as needed for Muscle spasms 02/25/24     dihydroergotamine  (MIGRANAL ) 4  MG/ML nasal spray Place into the nose. Patient not taking: Reported on 06/18/2023 03/11/23   [provider]  Erenumab -aooe (AIMOVIG ) 140 MG/ML SOAJ Inject 140 mg into the skin every 28 (twenty-eight) days. 12/24/22     escitalopram  (LEXAPRO ) 10 MG tablet Take 1 tablet (10 mg total) by mouth once daily Patient taking differently: Take 5 mg by mouth daily. 09/11/23     escitalopram  (LEXAPRO ) 10 MG tablet Take 1 tablet (10 mg total) by mouth daily. 09/11/23     estradiol  (ESTRACE ) 0.1 MG/GM vaginal cream SMARTSIG:2 Gram(s) Vaginal Twice a Week Patient not taking: Reported on 06/18/2023    [provider]  estradiol  (ESTRACE ) 0.1 MG/GM vaginal cream Place 2 g vaginally 2 (two) times a week. Patient not taking: Reported on 06/18/2023 12/15/22     ferrous sulfate  220 (44 Fe) MG/5ML solution Take  220 mg by mouth 2 (two) times daily with a meal. Patient not taking: Reported on 06/18/2023    [provider]  ferrous sulfate  220 (44 Fe) MG/5ML solution Take 5 mLs (220 mg total) by mouth 2 (two) times daily. 04/17/23     ferrous sulfate  220 (44 Fe) MG/5ML solution Take 5 mLs (220 mg total) by mouth 2 (two) times daily. 04/13/24     furosemide  (LASIX ) 20 MG tablet Take 1 tablet (20 mg total) by mouth daily. 04/08/24     Galcanezumab -gnlm (EMGALITY ) 120 MG/ML SOAJ Inject 120 mg into the skin every 28 (twenty-eight) days. 10/19/23     HYDROcodone -acetaminophen  (NORCO) 5-325 MG tablet Take 1 tablet by mouth every 6 (six) hours as needed for up to 6 doses for moderate pain. Patient not taking: Reported on 06/18/2023 11/06/22   Sakai, Isami, DO  hydrocortisone  2.5 % ointment Apply 1 Application topically 2 (two) times daily  for 7 days. Patient not taking: Reported on 06/18/2023 01/21/23     hydrOXYzine  (ATARAX ) 25 MG tablet Take 1 tablet (25 mg total) by mouth 3 (three) times daily as needed for anxiety. 04/13/24   Brimage, Vondra, DO  ibuprofen  (ADVIL ) 600 MG tablet Take 1 tablet (600 mg total) by mouth  every 4 (four) - 6 (six) hours. 12/03/23     ibuprofen  (ADVIL ) 600 MG tablet Take 1 tablet (600mg ) by mouth every 4 to 6 hours. 04/07/24     Lasmiditan  Succinate 100 MG TABS Take 1 tablet (100 mg total) by mouth as needed for migraine rescue. No more than 1 tablet every 24 hours. 10/19/23     metFORMIN  (GLUCOPHAGE -XR) 500 MG 24 hr tablet Take 1 tablet (500 mg total) by mouth daily with supper. 02/25/24     ondansetron  (ZOFRAN ) 4 MG tablet Take 1 tablet (4 mg total) by mouth every 8 (eight) hours as needed. 04/16/23   Jacquie Maudlin, MD  ondansetron  (ZOFRAN ) 4 MG tablet Take 1 tablet (4 mg total) by mouth every 8 (eight) hours as needed. 10/19/23     permethrin  (ELIMITE ) 5 % cream Apply cream from head to feet, leave on for 8 to 14 hours, then wash with soap/water , repeat application if living mites present 14 days after initial treatment 04/13/24   Brimage, Vondra, DO  Plecanatide  (TRULANCE ) 3 MG TABS Take 1 tablet (3 mg total) by mouth daily. 05/14/23 05/08/24  Brigitte Canard, PA-C  predniSONE  (STERAPRED UNI-PAK 21 TAB) 10 MG (21) TBPK tablet Take by mouth daily. Take 6 tabs by mouth daily for 1, then 5 tabs for 1 day, then 4 tabs for 1 day, then 3 tabs for 1 day, then 2 tabs for 1 day, then 1 tab for 1 day. 02/21/24   Brimage, Vondra, DO  Rimegepant Sulfate  (NURTEC) 75 MG TBDP Take 1 tablet (75 mg total) by mouth as directed at onset of migraine once a day as needed. 11/17/23     triamcinolone  ointment (KENALOG ) 0.1 % Apply 1 Application topically 2 (two) times daily. 04/13/24   Brimage, Vondra, DO  valACYclovir  (VALTREX ) 1000 MG tablet Take 1 tablet (1,000 mg total) by mouth 2 (two) times daily for 7days 10/08/23     linaclotide  (LINZESS ) 72 MCG capsule Take 1 capsule (72 mcg total) by mouth daily. 02/19/23 04/23/23    XULANE 150-35 MCG/24HR transdermal patch 1 patch once a week. 09/04/19 11/18/19  [provider]    Family History Family History  Problem Relation Age of Onset   Hypertension Mother  Alcohol abuse Mother    Liver disease Mother    Other Father        unknown medical history   Hypertension Maternal Grandmother    Diabetes Maternal Grandmother    Dementia Maternal Grandmother    Autoimmune disease Daughter     Social History Social History   Tobacco Use   Smoking status: Never   Smokeless tobacco: Never  Vaping Use   Vaping status: Never Used  Substance Use Topics   Alcohol use: Yes    Alcohol/week: 1.0 standard drink of alcohol    Types: 1 Glasses of wine per week    Comment: occ wine   Drug use: No     Allergies   Vicodin [hydrocodone -acetaminophen ]   Review of Systems Review of Systems  Respiratory:  Positive for shortness of breath.   Cardiovascular:  Positive for chest pain.  Gastrointestinal:  Positive for nausea.  Musculoskeletal:  Positive for back pain.  Neurological:  Positive for dizziness.     Physical Exam Triage Vital Signs ED Triage Vitals  Encounter Vitals Group     BP 04/22/24 1808 130/85     Systolic BP Percentile --      Diastolic BP Percentile --      Pulse Rate 04/22/24 1808 (!) 112     Resp 04/22/24 1808 14     Temp 04/22/24 1808 99 F (37.2 C)     Temp Source 04/22/24 1808 Oral     SpO2 04/22/24 1808 100 %     Weight 04/22/24 1806 190 lb 0.6 oz (86.2 kg)     Height 04/22/24 1806 5' (1.524 m)     Head Circumference --      Peak Flow --      Pain Score 04/22/24 1806 6     Pain Loc --      Pain Education --      Exclude from Growth Chart --    No data found.  Updated Vital Signs BP 130/85 (BP Location: Left Arm)   Pulse (!) 112   Temp 99 F (37.2 C) (Oral)   Resp 14   Ht 5' (1.524 m)   Wt 190 lb 0.6 oz (86.2 kg)   LMP 10/27/2021 (Exact Date) Comment: having depot shots  SpO2 100%   BMI 37.11 kg/m   Visual Acuity Right Eye Distance:   Left Eye Distance:   Bilateral Distance:    Right Eye Near:   Left Eye Near:    Bilateral Near:     Physical Exam Vitals and nursing note reviewed.   Constitutional:      General: She is not in acute distress.    Appearance: Normal appearance. She is not ill-appearing.  HENT:     Head: Normocephalic and atraumatic.  Eyes:     Pupils: Pupils are equal, round, and reactive to light.  Cardiovascular:     Rate and Rhythm: Regular rhythm. Tachycardia present.     Heart sounds: Normal heart sounds.  Pulmonary:     Effort: Pulmonary effort is normal.     Breath sounds: Normal breath sounds.  Neurological:     General: No focal deficit present.     Mental Status: She is alert and oriented to person, place, and time.  Psychiatric:        Mood and Affect: Mood normal.        Behavior: Behavior normal.      UC Treatments / Results  Labs (all labs ordered are listed, but  only abnormal results are displayed) Labs Reviewed - No data to display  EKG   Radiology No results found.  Procedures ED EKG  Date/Time: 04/22/2024 6:27 PM  Performed by: Alleen Arbour, NP Authorized by: Floydene Hy, PA-C   ECG interpreted by ED Physician in the absence of a cardiologist: no   Interpretation:    Interpretation: abnormal   Rate:    ECG rate:  111   ECG rate assessment: tachycardic   Rhythm:    Rhythm: sinus tachycardia   Ectopy:    Ectopy: none   QRS:    QRS axis:  Normal ST segments:    ST segments:  Normal T waves:    T waves: normal    (including critical care time)  Medications Ordered in UC Medications - No data to display  Initial Impression / Assessment and Plan / UC Course  I have reviewed the triage vital signs and the nursing notes.  Pertinent labs & imaging results that were available during my care of the patient were reviewed by me and considered in my medical decision making (see chart for details).     Reviewed exam and symptoms with patient.  Discussed limitations and abilities of urgent care.  Given patient's acute presentation I did advise that she go to the emergency room to rule out any  life-threatening causes of her symptoms including cardiac and pulmonary.  Patient is in agreement with plan and will go POV with her daughter driving to the emergency room. She declined EMS transfer.  She was instructed to pull over and call 911 for any worsening symptoms that occur in transit and she verbalized understanding. Final Clinical Impressions(s) / UC Diagnoses   Final diagnoses:  Chest pain, unspecified type     Discharge Instructions      Pt to ER for further evaluation of chest pain    ED Prescriptions   None    PDMP not reviewed this encounter.   Alleen Arbour, NP 04/22/24 1830    Alleen Arbour, NP 04/22/24 1831    Alleen Arbour, NP 04/22/24 [phone]

## 2024-04-23 DIAGNOSIS — R079 Chest pain, unspecified: Secondary | ICD-10-CM | POA: Diagnosis not present

## 2024-04-25 ENCOUNTER — Other Ambulatory Visit: Payer: Self-pay

## 2024-04-25 ENCOUNTER — Ambulatory Visit (INDEPENDENT_AMBULATORY_CARE_PROVIDER_SITE_OTHER): Payer: 59 | Admitting: Dermatology

## 2024-04-25 DIAGNOSIS — L821 Other seborrheic keratosis: Secondary | ICD-10-CM | POA: Diagnosis not present

## 2024-04-25 DIAGNOSIS — L918 Other hypertrophic disorders of the skin: Secondary | ICD-10-CM | POA: Diagnosis not present

## 2024-04-25 DIAGNOSIS — L669 Cicatricial alopecia, unspecified: Secondary | ICD-10-CM

## 2024-04-25 MED ORDER — FLUOCINOLONE ACETONIDE SCALP 0.01 % EX OIL
1.0000 | TOPICAL_OIL | Freq: Two times a day (BID) | CUTANEOUS | 1 refills | Status: AC | PRN
  Filled 2024-04-25: qty 118.28, 30d supply, fill #0

## 2024-04-25 NOTE — Progress Notes (Signed)
 New Patient Visit   Subjective  Cindy Hays is a 43 y.o. female who presents for the following: patient here today concerning alopecia dealing with for over 3 years, has tried black castro oil and rosemary oil and other otc oils. Patient reports she is under a lot of stress denies any hospitalization or sickness. Mother also has hair loss.  She get weaves put in her hair.  Patient denies sore areas at scalp. Occasional itching.  The patient has spots, moles and lesions to be evaluated, some may be new or changing and the patient may have concern these could be cancer.   The following portions of the chart were reviewed this encounter and updated as appropriate: medications, allergies, medical history  Review of Systems:  No other skin or systemic complaints except as noted in HPI or Assessment and Plan.  Objective  Well appearing patient in no apparent distress; mood and affect are within normal limits.   A focused examination was performed of the following areas: scalp  Relevant exam findings are noted in the Assessment and Plan.  Scalp BL frontal scalp with scarring alopecia with loss of follicles.  Some hair follicles at frontal hairline. 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 - Observe - Call for any changes  Acrochordons (Skin Tags) Left lower axilla - Fleshy, skin-colored pedunculated papules - Benign appearing.  - Observe. - If desired, they can be removed with an in office procedure that is not covered by insurance. - Please call the clinic if you notice any new or changing lesions.    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 Condition is bothersome/symptomatic for patient. Currently flared.   Central Centrifugal  Cicatricial Alopecia (CCCA) is a chronic/progressive and irreversible patterned form of scarring alopecia that most commonly affects middle-aged women of African descent that presents with progressive hair loss, starting as a single patch at the vertex of the scalp and then expanding in a centrifugal and symmetrical pattern on the crown. Triggering or aggravation of the disease may occur following traumatic hair care practices, such as cornrows and braiding, extensions, weaves with sewn-in or glued-on hair, use of hot combs, and frequent use of hair relaxers. These practices should be discontinued to slow progression of disease. Therapies may stop or slow progression but generally do not lead to hair regrowth in scarred areas.    Frontal fibrosing alopecia (FFA) is a chronic/progressive and irreversible patterned form of scarring alopecia that presents as a symmetric band-like zone of hair loss along the anterior hairline. The eyebrows are often thinned or absent. It is considered a variant of lichen planopilaris in a patterned distribution and is more common in women. Therapies may stop or slow progression but generally do not lead to hair regrowth.   Recommend punch biopsies x 2 at scalp 3.5 mm punch to confirm diagnosis R/o Central Centrifugal Scarring Alopecia vs LPP/FFA  Patient unable to do bx today, will schedule to come back   Discussed that hair does not regrow in areas of scarring, even with treatment. Advised to minimize weaves, and avoid chemical/heat treatments to scalp hair  Start dermasmooth fs oil - apply topically qd / bid daily as needed to itchy areas on scalp May start 5% Rogaine to crown at bedtime  Recommend minoxidil 5% (Rogaine for men) solution or foam  to be applied to the scalp and left in. This should ideally be used twice daily for best results but it helps with hair regrowth when used at least three times per week. Rogaine initially can cause increased hair shedding for the  first few weeks but this will stop with continued use. In studies, people who used minoxidil (Rogaine) for at least 6 months had thicker hair than people who did not. Minoxidil topical (Rogaine) only works as long as it continues to be used. If if it is no longer used then the hair it has been helping to regrow can fall out. Minoxidil topical (Rogaine) can cause increased facial hair growth which can usually be managed easily with a battery-operated hair trimmer. If facial hair growth is bothersome, switching to the 2% women's version can decrease the risk of unwanted facial hair growth.  Fluocinolone Acetonide Scalp (DERMA-SMOOTHE/FS SCALP) 0.01 % OIL - Scalp Apply 1 Application topically 1 to  2  times daily as needed.  Return for schedule for punch bx x 2 at scalp.  I, Randee Busing, CMA, am acting as scribe for Artemio Larry, MD.   Documentation: I have reviewed the above documentation for accuracy and completeness, and I agree with the above.  Artemio Larry, MD

## 2024-04-25 NOTE — Patient Instructions (Addendum)
 Recommend minoxidil 5% (Rogaine for men) solution or foam to be applied to the scalp and left in. This should ideally be used twice daily for best results but it helps with hair regrowth when used at least three times per week. Rogaine initially can cause increased hair shedding for the first few weeks but this will stop with continued use. In studies, people who used minoxidil (Rogaine) for at least 6 months had thicker hair than people who did not. Minoxidil topical (Rogaine) only works as long as it continues to be used. If if it is no longer used then the hair it has been helping to regrow can fall out. Minoxidil topical (Rogaine) can cause increased facial hair growth which can usually be managed easily with a battery-operated hair trimmer. If facial hair growth is bothersome, switching to the 2% women's version can decrease the risk of unwanted facial hair growth.      Recommend avoid using very hot treatments like curling irons , hot combs or straighteners  Recommend avoid chemical treatments like perms  Recommend avoiding tight hair styles such as braids corn rolls etc     Recommend cerave itch relief cream or lotion apply after shower  For dermasmooth oil can use after shower to itchy spots on body   Gentle Skin Care Guide  1. Bathe no more than once a day.  2. Avoid bathing in hot water   3. Use a mild soap like Dove, Vanicream, Cetaphil, CeraVe. Can use Lever 2000 or Cetaphil antibacterial soap  4. Use soap only where you need it. On most days, use it under your arms, between your legs, and on your feet. Let the water  rinse other areas unless visibly dirty.  5. When you get out of the bath/shower, use a towel to gently blot your skin dry, don't rub it.  6. While your skin is still a little damp, apply a moisturizing cream such as Vanicream, CeraVe, Cetaphil, Eucerin, Sarna lotion or plain Vaseline Jelly. For hands apply Neutrogena [place] Hand Cream or Excipial Hand  Cream.  7. Reapply moisturizer any time you start to itch or feel dry.  8. Sometimes using free and clear laundry detergents can be helpful. Fabric softener sheets should be avoided. Downy Free & Gentle liquid, or any liquid fabric softener that is free of dyes and perfumes, it acceptable to use  9. If your doctor has given you prescription creams you may apply moisturizers over them    Seborrheic Keratosis  What causes seborrheic keratoses? Seborrheic keratoses are harmless, common skin growths that first appear during adult life.  As time goes by, more growths appear.  Some people may develop a large number of them.  Seborrheic keratoses appear on both covered and uncovered body parts.  They are not caused by sunlight.  The tendency to develop seborrheic keratoses can be inherited.  They vary in color from skin-colored to gray, brown, or even black.  They can be either smooth or have a rough, warty surface.   Seborrheic keratoses are superficial and look as if they were stuck on the skin.  Under the microscope this type of keratosis looks like layers upon layers of skin.  That is why at times the top layer may seem to fall off, but the rest of the growth remains and re-grows.    Treatment Seborrheic keratoses do not need to be treated, but can easily be removed in the office.  Seborrheic keratoses often cause symptoms when they rub on clothing or  jewelry.  Lesions can be in the way of shaving.  If they become inflamed, they can cause itching, soreness, or burning.  Removal of a seborrheic keratosis can be accomplished by freezing, burning, or surgery. If any spot bleeds, scabs, or grows rapidly, please return to have it checked, as these can be an indication of a skin cancer.  Due to recent changes in healthcare laws, you may see results of your pathology and/or laboratory studies on MyChart before the doctors have had a chance to review them. We understand that in some cases there may be  results that are confusing or concerning to you. Please understand that not all results are received at the same time and often the doctors may need to interpret multiple results in order to provide you with the best plan of care or course of treatment. Therefore, we ask that you please give us  2 business days to thoroughly review all your results before contacting the office for clarification. Should we see a critical lab result, you will be contacted sooner.   If You Need Anything After Your Visit  If you have any questions or concerns for your doctor, please call our main line at [phone] and press option 4 to reach your doctor's medical assistant. If no one answers, please leave a voicemail as directed and we will return your call as soon as possible. Messages left after 4 pm will be answered the following business day.   You may also send us  a message via MyChart. We typically respond to MyChart messages within 1-2 business days.  For prescription refills, please ask your pharmacy to contact our office. Our fax number is [phone].  If you have an urgent issue when the clinic is closed that cannot wait until the next business day, you can page your doctor at the number below.    Please note that while we do our best to be available for urgent issues outside of office hours, we are not available 24/7.   If you have an urgent issue and are unable to reach us , you may choose to seek medical care at your doctor's office, retail clinic, urgent care center, or emergency room.  If you have a medical emergency, please immediately call 911 or go to the emergency department.  Pager Numbers  - Dr. Bary Likes: [phone]  - Dr. Annette Barters: [phone]  - Dr. Felipe Horton: [phone]   In the event of inclement weather, please call our main line at [phone] for an update on the status of any delays or closures.  Dermatology Medication Tips: Please keep the boxes that topical medications  come in in order to help keep track of the instructions about where and how to use these. Pharmacies typically print the medication instructions only on the boxes and not directly on the medication tubes.   If your medication is too expensive, please contact our office at [phone] option 4 or send us  a message through MyChart.   We are unable to tell what your co-pay for medications will be in advance as this is different depending on your insurance coverage. However, we may be able to find a substitute medication at lower cost or fill out paperwork to get insurance to cover a needed medication.   If a prior authorization is required to get your medication covered by your insurance company, please allow us  1-2 business days to complete this process.  Drug prices often vary depending on where the prescription is filled and some pharmacies may offer  cheaper prices.  The website www.goodrx.com contains coupons for medications through different pharmacies. The prices here do not account for what the cost may be with help from insurance (it may be cheaper with your insurance), but the website can give you the price if you did not use any insurance.  - You can print the associated coupon and take it with your prescription to the pharmacy.  - You may also stop by our office during regular business hours and pick up a GoodRx coupon card.  - If you need your prescription sent electronically to a different pharmacy, notify our office through [facility] or by phone at [phone] option 4.     Si Usted Necesita Algo Despus de Su Visita  Tambin puede enviarnos un mensaje a travs de Clinical cytogeneticist. Por lo general respondemos a los mensajes de MyChart en el transcurso de 1 a 2 das hbiles.  Para renovar recetas, por favor pida a su farmacia que se ponga en contacto con nuestra oficina. Franz Jacks de fax es [place] [phone].  Si tiene un asunto urgente cuando la clnica est cerrada y que no  puede esperar hasta el siguiente da hbil, puede llamar/localizar a su doctor(a) al nmero que aparece a continuacin.   Por favor, tenga en cuenta que aunque hacemos todo lo posible para estar disponibles para asuntos urgentes fuera del horario de [place], no estamos disponibles las 24 horas del da, los 7 [address]  [address] de la [place].   Si tiene un problema urgente y no puede comunicarse con nosotros, puede optar por buscar atencin mdica  en el consultorio de su doctor(a), en una clnica privada, en un centro de atencin urgente o en una sala de emergencias.  Si tiene Engineer, drilling, por favor llame inmediatamente al 911 o vaya a la sala de emergencias.  Nmeros de bper  - Dr. Bary Likes: [phone]  - Dra. Annette Barters: [phone]  - Dr. Felipe Horton: [phone]   En caso de inclemencias del tiempo, por favor llame a Lajuan Pila principal al [phone] para una actualizacin sobre el [place] de cualquier retraso o cierre.  Consejos para la medicacin en dermatologa: Por favor, guarde las cajas en las que vienen los medicamentos de uso tpico para ayudarle a seguir las instrucciones sobre dnde y cmo usarlos. Las farmacias generalmente imprimen las instrucciones del medicamento slo en las cajas y no directamente en los tubos del [place].   Si su medicamento es muy caro, por favor, pngase en contacto con Bettyjane Brunet llamando al [phone] y presione la opcin 4 o envenos un mensaje a travs de Clinical cytogeneticist.   No podemos decirle cul ser su copago por los medicamentos por adelantado ya que esto es diferente dependiendo de la cobertura de su seguro. Sin embargo, es posible que podamos encontrar un medicamento sustituto a Audiological scientist un formulario para que el seguro cubra el medicamento que se considera necesario.   Si se requiere una autorizacin previa para que su compaa de seguros [place] su medicamento, por favor permtanos de 1 a 2 das hbiles para completar este  proceso.  Los precios de los medicamentos varan con frecuencia dependiendo del Environmental consultant de dnde se surte la receta y alguna farmacias pueden ofrecer precios ms baratos.  El sitio web www.goodrx.com tiene cupones para medicamentos de Health and safety inspector. Los precios aqu no tienen en cuenta lo que podra costar con la ayuda del seguro (puede ser ms barato con su seguro), pero el sitio web puede darle el precio si no Dance movement psychotherapist  ningn seguro.  - Puede imprimir el cupn correspondiente y llevarlo con su receta a la farmacia.  - Tambin puede pasar por nuestra oficina durante el horario de atencin regular y Education officer, museum una tarjeta de cupones de GoodRx.  - Si necesita que su receta se enve electrnicamente a una farmacia diferente, informe a nuestra oficina a travs de MyChart de [place] o por telfono llamando al [phone] y presione la opcin 4.

## 2024-04-28 ENCOUNTER — Ambulatory Visit
Admission: EM | Admit: 2024-04-28 | Discharge: 2024-04-28 | Disposition: A | Discharge status: Home / Self Care | Attending: Physician Assistant | Admitting: Physician Assistant

## 2024-04-28 ENCOUNTER — Encounter: Payer: Self-pay | Admitting: *Deleted

## 2024-04-28 DIAGNOSIS — R21 Rash and other nonspecific skin eruption: Secondary | ICD-10-CM | POA: Diagnosis not present

## 2024-04-28 DIAGNOSIS — W57XXXA Bitten or stung by nonvenomous insect and other nonvenomous arthropods, initial encounter: Secondary | ICD-10-CM | POA: Diagnosis not present

## 2024-04-28 MED ORDER — HYDROXYZINE HCL 25 MG PO TABS: 25.0000 mg | ORAL_TABLET | Freq: Four times a day (QID) | ORAL | 1 refills | Status: AC | PRN

## 2024-04-28 MED ORDER — TRIAMCINOLONE ACETONIDE 0.5 % EX OINT: 1.0000 | TOPICAL_OINTMENT | Freq: Two times a day (BID) | CUTANEOUS | 0 refills | Status: AC

## 2024-04-28 NOTE — Discharge Instructions (Signed)
-   I sent more triamcinolone  but stronger. - I also refilled the hydroxyzine  for itching. - Cool compresses to the area. - Have a second opinion about potential bugs in your house.  I am concerned for possible bedbugs that were missed on the first exam.  Also check your son's animal for fleas and if they have fleas animal and the house should be treated for fleas.

## 2024-04-28 NOTE — ED Triage Notes (Signed)
 Patient states she was seen 2 weeks ago for rash and completed treatment, and has had recurrence of bumps and itching.

## 2024-04-28 NOTE — ED Provider Notes (Signed)
 [facility]    CSN: [id number] Arrival date & time: 04/28/24  1747      History   Chief Complaint Chief Complaint  Patient presents with   Rash    HPI Cindy Hays is a 43 y.o. female presenting for 2-week history of full body pruritus and bumps on body.  Patient reports she was seen here a couple weeks ago and treated for possible scabies with permethrin  cream and she was also given 0.1 percent topical triamcinolone  cream.  Reports these medications seem to have helped but in the past couple days she has noticed larger big red bumps pop up on her body.  She reports that her son is being seen today as well for bumps on his knees.  He sleeps in the same bed with her.  She also has another child sleep in the same bed but he does not have any rash.  She reports the extremity came out to her house and did not find any bugs.  She says her other son has a dog and she does not know if it has fleas but she will check it.  She denies any changes to her body washes or detergents.  She denies sleeping in any other beds.  She says that she has washed all of her bedding and clothing.  HPI  Past Medical History:  Diagnosis Date   Anemia    Arthritis    B12 deficiency    Cervical lymphadenopathy 03/26/2010   Contraceptive management 01/19/2008   Dysuria 11/13/2014   Fatigue 06/15/2009   Gallstones 10/2022   High-risk pregnancy 06/27/2017   43 y.o. [id number] at [redacted]w[redacted]d by  LMP c/w  week ultrasound  Sex of baby and name: boy     Factors complicating this pregnancy   1. Hx IUFD last pregnancy at 26.4wks (10/2017)   MFM referral placed- seen by MFM 5/2; anti-beta 2 glycoprotein - negative   Plan q4wk growth US    antepartum testing nst  2x/ week + weekly NST   2. CHTN on  meds since postpartum last delivery 11/2017- currently taking Pro   History of gestational hypertension    Hypertension    Migraine    Nipple discharge 11/13/2014   Papanicolaou smear of cervix with low grade  squamous intraepithelial lesion (LGSIL) 11/16/2014   Pregnancy test positive 06/15/2017   Preterm labor    Uterine leiomyoma     Patient Active Problem List   Diagnosis Date Noted   Complicated migraine 10/20/2023   Polyp of descending colon 06/25/2023   Excessive and frequent menstruation 05/12/2023   History of uterine fibroid 05/12/2023   Carpal tunnel syndrome of right wrist 03/12/2021   NSVT (nonsustained ventricular tachycardia) (HCC)    Chronic headaches 02/06/2020   Recurrent syncope 02/06/2020   Occipital neuralgia 02/06/2020   Sinus tachycardia 02/06/2020   ANA positive 02/06/2020   Syncope 02/06/2020   Intractable menstrual migraine without status migrainosus 01/31/2020   Iron deficiency anemia 10/28/2019   Pregnancy 08/04/2018   Indication for care in labor or delivery 08/03/2018   Labor and delivery indication for care or intervention 08/02/2018   Chronic hypertension with superimposed pre-eclampsia 07/30/2018   Chronic hypertension with superimposed preeclampsia 07/21/2018   Short interval between pregnancies affecting pregnancy in first trimester, antepartum 03/18/2018   Advanced maternal age in multigravida, first trimester 03/18/2018   Hypertension 11/17/2017   Fetal demise, greater than 22 weeks, antepartum, single gestation 11/13/2017   Hx of preeclampsia, prior pregnancy, currently pregnant  10/22/2017   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   Herpes simplex 07/29/2017   Obesity (BMI 35.0-39.9 without comorbidity) 07/23/2017    Past Surgical History:  Procedure Laterality Date   COLONOSCOPY WITH PROPOFOL  N/A 06/25/2023   Procedure: COLONOSCOPY WITH PROPOFOL ;  Surgeon: Marnee Sink, MD;  Location: [facility];  Service: Endoscopy;  Laterality: N/A;   ESOPHAGOGASTRODUODENOSCOPY (EGD) WITH PROPOFOL  N/A 06/25/2023   Procedure: ESOPHAGOGASTRODUODENOSCOPY (EGD) WITH PROPOFOL ;  Surgeon: Marnee Sink, MD;  Location: [facility];  Service: Endoscopy;  Laterality:  N/A;   POLYPECTOMY  06/25/2023   Procedure: POLYPECTOMY;  Surgeon: Marnee Sink, MD;  Location: [facility];  Service: Endoscopy;;   ROBOTIC ASSISTED LAPAROSCOPIC HYSTERECTOMY AND SALPINGECTOMY Bilateral 12/20/2021   Procedure: XI ROBOTIC ASSISTED LAPAROSCOPIC HYSTERECTOMY AND SALPINGECTOMY;  Surgeon: Prescilla Brod, MD;  Location: [facility];  Service: Gynecology;  Laterality: Bilateral;    OB History     Gravida  6   Para  6   Term  4   Preterm  1   AB      Living  5      SAB      IAB      Ectopic      Multiple  0   Live Births  1            Home Medications    Prior to Admission medications   Medication Sig Start Date End Date Taking? Authorizing Provider  triamcinolone  ointment (KENALOG ) 0.5 % Apply 1 Application topically 2 (two) times daily. 04/28/24  Yes Nancy Axon B, PA-C  loratadine  (CLARITIN ) 10 MG tablet Take 10 mg by mouth daily. 02/03/20 05/13/20  [provider]  albuterol  (VENTOLIN  HFA) 108 (90 Base) MCG/ACT inhaler Inhale 2 puffs into the lungs every 6 (six) hours as needed for wheezing or shortness of breath. 04/16/23   Jacquie Maudlin, MD  busPIRone  (BUSPAR ) 5 MG tablet Take 1 tablet (5 mg total) by mouth 2 (two) times daily 02/25/24     butalbital -acetaminophen -caffeine  (FIORICET ) 50-325-40 MG tablet Take 2 tablets by mouth every 4 (four) hours as needed for pain Patient not taking: Reported on 06/18/2023 02/19/23   Will Hare, NP  cyclobenzaprine  (FLEXERIL ) 10 MG tablet Take 1 tablet (10 mg total) by mouth 2 (two) times daily as needed for Muscle spasms 02/21/24   Brimage, Vondra, DO  cyclobenzaprine  (FLEXERIL ) 10 MG tablet Take 1 tablet (10 mg total) by mouth 2 (two) times daily as needed for Muscle spasms 02/25/24     dihydroergotamine  (MIGRANAL ) 4 MG/ML nasal spray Place into the nose. 03/11/23   [provider]  Erenumab -aooe (AIMOVIG ) 140 MG/ML SOAJ Inject 140 mg into the skin every 28 (twenty-eight) days. 12/24/22      escitalopram  (LEXAPRO ) 10 MG tablet Take 1 tablet (10 mg total) by mouth once daily Patient taking differently: Take 5 mg by mouth daily. 09/11/23     escitalopram  (LEXAPRO ) 10 MG tablet Take 1 tablet (10 mg total) by mouth daily. 09/11/23     estradiol  (ESTRACE ) 0.1 MG/GM vaginal cream     [provider]  estradiol  (ESTRACE ) 0.1 MG/GM vaginal cream Place 2 g vaginally 2 (two) times a week. 12/15/22     ferrous sulfate  220 (44 Fe) MG/5ML solution Take 220 mg by mouth 2 (two) times daily with a meal.    [provider]  ferrous sulfate  220 (44 Fe) MG/5ML solution Take 5 mLs (220 mg total) by mouth 2 (two) times daily. 04/17/23  ferrous sulfate  220 (44 Fe) MG/5ML solution Take 5 mLs (220 mg total) by mouth 2 (two) times daily. 04/13/24     Fluocinolone Acetonide Scalp (DERMA-SMOOTHE/FS SCALP) 0.01 % OIL Apply 1 Application topically 1 to  2  times daily as needed. 04/25/24   Artemio Larry, MD  furosemide  (LASIX ) 20 MG tablet Take 1 tablet (20 mg total) by mouth daily. 04/08/24     Galcanezumab -gnlm (EMGALITY ) 120 MG/ML SOAJ Inject 120 mg into the skin every 28 (twenty-eight) days. 10/19/23     HYDROcodone -acetaminophen  (NORCO) 5-325 MG tablet Take 1 tablet by mouth every 6 (six) hours as needed for up to 6 doses for moderate pain. 11/06/22   Rosea Conch, Isami, DO  hydrocortisone  2.5 % ointment Apply 1 Application topically 2 (two) times daily  for 7 days. 01/21/23     hydrOXYzine  (ATARAX ) 25 MG tablet Take 1 tablet (25 mg total) by mouth 3 (three) times daily as needed for anxiety. 04/13/24   Brimage, Vondra, DO  hydrOXYzine  (ATARAX ) 25 MG tablet Take 1 tablet by mouth 3 (three) times daily as needed.    [provider]  ibuprofen  (ADVIL ) 600 MG tablet Take 1 tablet (600 mg total) by mouth every 4 (four) - 6 (six) hours. 12/03/23     ibuprofen  (ADVIL ) 600 MG tablet Take 1 tablet (600mg ) by mouth every 4 to 6 hours. 04/07/24     Lasmiditan  Succinate 100 MG TABS Take 1 tablet (100 mg  total) by mouth as needed for migraine rescue. No more than 1 tablet every 24 hours. 10/19/23     metFORMIN  (GLUCOPHAGE -XR) 500 MG 24 hr tablet Take 1 tablet (500 mg total) by mouth daily with supper. 02/25/24     ondansetron  (ZOFRAN ) 4 MG tablet Take 1 tablet (4 mg total) by mouth every 8 (eight) hours as needed. 04/16/23   Jacquie Maudlin, MD  ondansetron  (ZOFRAN ) 4 MG tablet Take 1 tablet (4 mg total) by mouth every 8 (eight) hours as needed. 10/19/23     permethrin  (ELIMITE ) 5 % cream Apply cream from head to feet, leave on for 8 to 14 hours, then wash with soap/water , repeat application if living mites present 14 days after initial treatment 04/13/24   Fidel Huddle, DO  Plecanatide  (TRULANCE ) 3 MG TABS Take 1 tablet (3 mg total) by mouth daily. 05/14/23 05/08/24  Brigitte Canard, PA-C  predniSONE  (STERAPRED UNI-PAK 21 TAB) 10 MG (21) TBPK tablet Take by mouth daily. Take 6 tabs by mouth daily for 1, then 5 tabs for 1 day, then 4 tabs for 1 day, then 3 tabs for 1 day, then 2 tabs for 1 day, then 1 tab for 1 day. 02/21/24   Brimage, Vondra, DO  Rimegepant Sulfate  (NURTEC) 75 MG TBDP Take 1 tablet (75 mg total) by mouth as directed at onset of migraine once a day as needed. 11/17/23     valACYclovir  (VALTREX ) 1000 MG tablet Take 1 tablet (1,000 mg total) by mouth 2 (two) times daily for 7days 10/08/23     linaclotide  (LINZESS ) 72 MCG capsule Take 1 capsule (72 mcg total) by mouth daily. 02/19/23 04/23/23    XULANE 150-35 MCG/24HR transdermal patch 1 patch once a week. 09/04/19 11/18/19  [provider]    Family History Family History  Problem Relation Age of Onset   Hypertension Mother    Alcohol abuse Mother    Liver disease Mother    Other Father        unknown medical history  Hypertension Maternal Grandmother    Diabetes Maternal Grandmother    Dementia Maternal Grandmother    Autoimmune disease Daughter     Social History Social History   Tobacco Use   Smoking status: Never    Smokeless tobacco: Never  Vaping Use   Vaping status: Never Used  Substance Use Topics   Alcohol use: Yes    Alcohol/week: 1.0 standard drink of alcohol    Types: 1 Glasses of wine per week    Comment: occ wine   Drug use: No     Allergies   Vicodin [hydrocodone -acetaminophen ]   Review of Systems Review of Systems  Constitutional:  Negative for fatigue and fever.  Skin:  Positive for rash. Negative for wound.     Physical Exam Triage Vital Signs ED Triage Vitals  Encounter Vitals Group     BP 04/28/24 1809 (!) 127/94     Girls Systolic BP Percentile --      Girls Diastolic BP Percentile --      Boys Systolic BP Percentile --      Boys Diastolic BP Percentile --      Pulse Rate 04/28/24 1809 87     Resp --      Temp 04/28/24 1809 99.1 F (37.3 C)     Temp Source 04/28/24 1809 Oral     SpO2 04/28/24 1809 100 %     Weight 04/28/24 1814 209 lb 12.8 oz (95.2 kg)     Height --      Head Circumference --      Peak Flow --      Pain Score 04/28/24 1810 0     Pain Loc --      Pain Education --      Exclude from Growth Chart --    No data found.  Updated Vital Signs BP (!) 127/94 (BP Location: Left Arm)   Pulse 87   Temp 99.1 F (37.3 C) (Oral)   Wt 209 lb 12.8 oz (95.2 kg)   LMP 10/27/2021 (Exact Date) Comment: having depot shots  SpO2 100%   BMI 40.97 kg/m    Physical Exam Vitals and nursing note reviewed.  Constitutional:      General: She is not in acute distress.    Appearance: Normal appearance. She is not ill-appearing or toxic-appearing.  HENT:     Head: Normocephalic and atraumatic.   Eyes:     General: No scleral icterus.       Right eye: No discharge.        Left eye: No discharge.     Conjunctiva/sclera: Conjunctivae normal.    Cardiovascular:     Rate and Rhythm: Normal rate.  Pulmonary:     Effort: Pulmonary effort is normal. No respiratory distress.   Musculoskeletal:     Cervical back: Neck supple.   Skin:    General: Skin is  dry.     Findings: Rash present. Erythema: large erythematous papules scattered on body (5 seen).  Neurological:     General: No focal deficit present.     Mental Status: She is alert. Mental status is at baseline.     Motor: No weakness.     Gait: Gait normal.   Psychiatric:        Mood and Affect: Mood normal.        Behavior: Behavior normal.      UC Treatments / Results  Labs (all labs ordered are listed, but only abnormal results are displayed) Labs Reviewed -  No data to display  EKG   Radiology No results found.  Procedures Procedures (including critical care time)  Medications Ordered in UC Medications - No data to display  Initial Impression / Assessment and Plan / UC Course  I have reviewed the triage vital signs and the nursing notes.  Pertinent labs & imaging results that were available during my care of the patient were reviewed by me and considered in my medical decision making (see chart for details).   43 year old female presents for full body pruritus and itchy bumps for the past couple weeks.  Initially thought to have scabies or other insect bite and prescribed permethrin  and topical 0.1% triamcinolone  cream.  Continues to have rash.  Son also has similar rash on his legs.  States exterminator found nothing when they came out.  Presentation is most consistent with bug bites, highest suspicion for bedbugs.  Sent 0.5% topical triamcinolone  ointment.  Advised her to check the animal for fleas and treat if animal has fleas.  Advised getting a second opinion from another exterminator.  Follow-up with dermatology if still not improving.   Final Clinical Impressions(s) / UC Diagnoses   Final diagnoses:  Rash  Insect bite, unspecified site, initial encounter     Discharge Instructions      - I sent more triamcinolone  but stronger. - I also refilled the hydroxyzine  for itching. - Cool compresses to the area. - Have a second opinion about potential  bugs in your house.  I am concerned for possible bedbugs that were missed on the first exam.  Also check your son's animal for fleas and if they have fleas animal and the house should be treated for fleas.   ED Prescriptions     Medication Sig Dispense Auth. Provider   triamcinolone  ointment (KENALOG ) 0.5 % Apply 1 Application topically 2 (two) times daily. 45 g Shaunna [place]      PDMP not reviewed this encounter.   Floydene Hy, PA-C 04/28/24 1836

## 2024-04-29 ENCOUNTER — Other Ambulatory Visit: Payer: Self-pay

## 2024-04-29 DIAGNOSIS — I10 Essential (primary) hypertension: Secondary | ICD-10-CM | POA: Diagnosis not present

## 2024-04-29 DIAGNOSIS — G43839 Menstrual migraine, intractable, without status migrainosus: Secondary | ICD-10-CM | POA: Diagnosis not present

## 2024-04-29 DIAGNOSIS — E669 Obesity, unspecified: Secondary | ICD-10-CM | POA: Diagnosis not present

## 2024-04-29 DIAGNOSIS — F411 Generalized anxiety disorder: Secondary | ICD-10-CM | POA: Diagnosis not present

## 2024-04-29 DIAGNOSIS — R7989 Other specified abnormal findings of blood chemistry: Secondary | ICD-10-CM | POA: Diagnosis not present

## 2024-04-29 MED ORDER — SERTRALINE HCL 50 MG PO TABS
50.0000 mg | ORAL_TABLET | Freq: Every day | ORAL | 1 refills | Status: DC
  Filled 2024-04-29: qty 30, 30d supply, fill #0

## 2024-05-02 ENCOUNTER — Ambulatory Visit: Admitting: Dietician

## 2024-05-04 ENCOUNTER — Encounter: Payer: Self-pay | Admitting: Emergency Medicine

## 2024-05-04 ENCOUNTER — Ambulatory Visit
Admission: EM | Admit: 2024-05-04 | Discharge: 2024-05-04 | Disposition: A | Discharge status: Home / Self Care | Attending: Family Medicine | Admitting: Family Medicine

## 2024-05-04 DIAGNOSIS — F411 Generalized anxiety disorder: Secondary | ICD-10-CM

## 2024-05-04 DIAGNOSIS — G43909 Migraine, unspecified, not intractable, without status migrainosus: Secondary | ICD-10-CM | POA: Diagnosis not present

## 2024-05-04 DIAGNOSIS — R42 Dizziness and giddiness: Secondary | ICD-10-CM | POA: Diagnosis not present

## 2024-05-04 DIAGNOSIS — H6992 Unspecified Eustachian tube disorder, left ear: Secondary | ICD-10-CM | POA: Diagnosis not present

## 2024-05-04 MED ORDER — PROCHLORPERAZINE MALEATE 10 MG PO TABS: 10.0000 mg | ORAL_TABLET | Freq: Two times a day (BID) | ORAL | 0 refills | Status: DC | PRN

## 2024-05-04 MED ORDER — MECLIZINE HCL 25 MG PO TABS: 25.0000 mg | ORAL_TABLET | Freq: Three times a day (TID) | ORAL | 0 refills | Status: DC | PRN

## 2024-05-04 MED ORDER — PREDNISONE 10 MG (21) PO TBPK: ORAL_TABLET | Freq: Every day | ORAL | 0 refills | Status: DC

## 2024-05-04 NOTE — ED Provider Notes (Incomplete)
 [facility]    CSN: [id number] Arrival date & time: 05/04/24  1515      History   Chief Complaint Chief Complaint  Patient presents with   Dizziness   Nausea    HPI Cindy Hays is a 43 y.o. female.   HPI   Cindy Hays presents for nausea, room spinning dizziness and decreased appetite with headache that started yesterday. She has history of migraines. She took her Nurtec and laid down. She has been drinking more water . Started Zoloft on Monday which she started for anxiety. She didn't take it yesterday or today.  She takes hydroxyzine  during her anxiety attacks.     Her son and herself were having frequent soft bowel movements with abdominal cramping.  No chest pain or shortness of breath.  Denies fever, body aches.   Took zofran  for nausea prior to arrival. Has ringing in her left ear     Past Medical History:  Diagnosis Date   Anemia    Arthritis    B12 deficiency    Cervical lymphadenopathy 03/26/2010   Contraceptive management 01/19/2008   Dysuria 11/13/2014   Fatigue 06/15/2009   Gallstones 10/2022   High-risk pregnancy 06/27/2017   43 y.o. [id number] at [redacted]w[redacted]d by  LMP c/w  week ultrasound  Sex of baby and name: boy     Factors complicating this pregnancy   1. Hx IUFD last pregnancy at 26.4wks (10/2017)   MFM referral placed- seen by MFM 5/2; anti-beta 2 glycoprotein - negative   Plan q4wk growth US    antepartum testing nst  2x/ week + weekly NST   2. CHTN on  meds since postpartum last delivery 11/2017- currently taking Pro   History of gestational hypertension    Hypertension    Migraine    Nipple discharge 11/13/2014   Papanicolaou smear of cervix with low grade squamous intraepithelial lesion (LGSIL) 11/16/2014   Pregnancy test positive 06/15/2017   Preterm labor    Uterine leiomyoma     Patient Active Problem List   Diagnosis Date Noted   Complicated migraine 10/20/2023   Polyp of descending colon 06/25/2023   Excessive and frequent  menstruation 05/12/2023   History of uterine fibroid 05/12/2023   Carpal tunnel syndrome of right wrist 03/12/2021   NSVT (nonsustained ventricular tachycardia) (HCC)    Chronic headaches 02/06/2020   Recurrent syncope 02/06/2020   Occipital neuralgia 02/06/2020   Sinus tachycardia 02/06/2020   ANA positive 02/06/2020   Syncope 02/06/2020   Intractable menstrual migraine without status migrainosus 01/31/2020   Iron deficiency anemia 10/28/2019   Pregnancy 08/04/2018   Indication for care in labor or delivery 08/03/2018   Labor and delivery indication for care or intervention 08/02/2018   Chronic hypertension with superimposed pre-eclampsia 07/30/2018   Chronic hypertension with superimposed preeclampsia 07/21/2018   Short interval between pregnancies affecting pregnancy in first trimester, antepartum 03/18/2018   Advanced maternal age in multigravida, first trimester 03/18/2018   Hypertension 11/17/2017   Fetal demise, greater than 22 weeks, antepartum, single gestation 11/13/2017   Hx of preeclampsia, prior pregnancy, currently pregnant 10/22/2017   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   Herpes simplex 07/29/2017   Obesity (BMI 35.0-39.9 without comorbidity) 07/23/2017    Past Surgical History:  Procedure Laterality Date   COLONOSCOPY WITH PROPOFOL  N/A 06/25/2023   Procedure: COLONOSCOPY WITH PROPOFOL ;  Surgeon: Marnee Sink, MD;  Location: [facility];  Service: Endoscopy;  Laterality: N/A;   ESOPHAGOGASTRODUODENOSCOPY (EGD) WITH PROPOFOL  N/A 06/25/2023   Procedure: ESOPHAGOGASTRODUODENOSCOPY (  EGD) WITH PROPOFOL ;  Surgeon: Marnee Sink, MD;  Location: [facility];  Service: Endoscopy;  Laterality: N/A;   POLYPECTOMY  06/25/2023   Procedure: POLYPECTOMY;  Surgeon: Marnee Sink, MD;  Location: [facility];  Service: Endoscopy;;   ROBOTIC ASSISTED LAPAROSCOPIC HYSTERECTOMY AND SALPINGECTOMY Bilateral 12/20/2021   Procedure: XI ROBOTIC ASSISTED LAPAROSCOPIC HYSTERECTOMY AND  SALPINGECTOMY;  Surgeon: Prescilla Brod, MD;  Location: [facility];  Service: Gynecology;  Laterality: Bilateral;    OB History     Gravida  6   Para  6   Term  4   Preterm  1   AB      Living  5      SAB      IAB      Ectopic      Multiple  0   Live Births  1            Home Medications    Prior to Admission medications   Medication Sig Start Date End Date Taking? Authorizing Provider  Galcanezumab -gnlm (EMGALITY ) 120 MG/ML SOAJ Inject 120 mg into the skin every 28 (twenty-eight) days. 10/19/23  Yes   loratadine  (CLARITIN ) 10 MG tablet Take 10 mg by mouth daily. 02/03/20 05/13/20  [provider]  albuterol  (VENTOLIN  HFA) 108 (90 Base) MCG/ACT inhaler Inhale 2 puffs into the lungs every 6 (six) hours as needed for wheezing or shortness of breath. 04/16/23   Jacquie Maudlin, MD  butalbital -acetaminophen -caffeine  (FIORICET ) 50-325-40 MG tablet Take 2 tablets by mouth every 4 (four) hours as needed for pain Patient not taking: Reported on 06/18/2023 02/19/23   Will Hare, NP  cyclobenzaprine  (FLEXERIL ) 10 MG tablet Take 1 tablet (10 mg total) by mouth 2 (two) times daily as needed for Muscle spasms 02/21/24   Nilani Hugill, DO  cyclobenzaprine  (FLEXERIL ) 10 MG tablet Take 1 tablet (10 mg total) by mouth 2 (two) times daily as needed for Muscle spasms 02/25/24     dihydroergotamine  (MIGRANAL ) 4 MG/ML nasal spray Place into the nose. 03/11/23   [provider]  Erenumab -aooe (AIMOVIG ) 140 MG/ML SOAJ Inject 140 mg into the skin every 28 (twenty-eight) days. 12/24/22     escitalopram  (LEXAPRO ) 10 MG tablet Take 1 tablet (10 mg total) by mouth once daily Patient taking differently: Take 5 mg by mouth daily. 09/11/23     escitalopram  (LEXAPRO ) 10 MG tablet Take 1 tablet (10 mg total) by mouth daily. 09/11/23     estradiol  (ESTRACE ) 0.1 MG/GM vaginal cream     [provider]  estradiol  (ESTRACE ) 0.1 MG/GM vaginal cream Place 2 g vaginally 2 (two)  times a week. 12/15/22     ferrous sulfate  220 (44 Fe) MG/5ML solution Take 220 mg by mouth 2 (two) times daily with a meal.    [provider]  ferrous sulfate  220 (44 Fe) MG/5ML solution Take 5 mLs (220 mg total) by mouth 2 (two) times daily. 04/17/23     ferrous sulfate  220 (44 Fe) MG/5ML solution Take 5 mLs (220 mg total) by mouth 2 (two) times daily. 04/13/24     Fluocinolone  Acetonide Scalp (DERMA-SMOOTHE /FS SCALP) 0.01 % OIL Apply 1 Application topically 1 to  2  times daily as needed. 04/25/24   Artemio Larry, MD  HYDROcodone -acetaminophen  (NORCO) 5-325 MG tablet Take 1 tablet by mouth every 6 (six) hours as needed for up to 6 doses for moderate pain. 11/06/22   Rosea Conch, Isami, DO  hydrocortisone  2.5 % ointment Apply 1 Application topically 2 (  two) times daily  for 7 days. 01/21/23     hydrOXYzine  (ATARAX ) 25 MG tablet Take 1 tablet (25 mg total) by mouth 3 (three) times daily as needed for anxiety. 04/13/24   Jordani Nunn, DO  hydrOXYzine  (ATARAX ) 25 MG tablet Take 1 tablet by mouth 3 (three) times daily as needed.    [provider]  hydrOXYzine  (ATARAX ) 25 MG tablet Take 1 tablet (25 mg total) by mouth every 6 (six) hours as needed for itching. 04/28/24   Floydene Hy, PA-C  ibuprofen  (ADVIL ) 600 MG tablet Take 1 tablet (600 mg total) by mouth every 4 (four) - 6 (six) hours. 12/03/23     ibuprofen  (ADVIL ) 600 MG tablet Take 1 tablet (600mg ) by mouth every 4 to 6 hours. 04/07/24     Lasmiditan  Succinate 100 MG TABS Take 1 tablet (100 mg total) by mouth as needed for migraine rescue. No more than 1 tablet every 24 hours. 10/19/23     metFORMIN  (GLUCOPHAGE -XR) 500 MG 24 hr tablet Take 1 tablet (500 mg total) by mouth daily with supper. 02/25/24     ondansetron  (ZOFRAN ) 4 MG tablet Take 1 tablet (4 mg total) by mouth every 8 (eight) hours as needed. 04/16/23   Jacquie Maudlin, MD  ondansetron  (ZOFRAN ) 4 MG tablet Take 1 tablet (4 mg total) by mouth every 8 (eight) hours as needed.  10/19/23     permethrin  (ELIMITE ) 5 % cream Apply cream from head to feet, leave on for 8 to 14 hours, then wash with soap/water , repeat application if living mites present 14 days after initial treatment 04/13/24   Devean Skoczylas, DO  Plecanatide  (TRULANCE ) 3 MG TABS Take 1 tablet (3 mg total) by mouth daily. 05/14/23 05/08/24  Brigitte Canard, PA-C  predniSONE  (STERAPRED UNI-PAK 21 TAB) 10 MG (21) TBPK tablet Take by mouth daily. Take 6 tabs by mouth daily for 1, then 5 tabs for 1 day, then 4 tabs for 1 day, then 3 tabs for 1 day, then 2 tabs for 1 day, then 1 tab for 1 day. 02/21/24   Eloyse Causey, DO  Rimegepant Sulfate  (NURTEC) 75 MG TBDP Take 1 tablet (75 mg total) by mouth as directed at onset of migraine once a day as needed. 11/17/23     sertraline (ZOLOFT) 50 MG tablet Take 1 tablet (50 mg total) by mouth daily. Start with 0.5 tablet (25 mg) for first week, if tolerating well, take the full tablet daily. 04/29/24     triamcinolone  ointment (KENALOG ) 0.5 % Apply 1 Application topically 2 (two) times daily. 04/28/24   Floydene Hy, PA-C  valACYclovir  (VALTREX ) 1000 MG tablet Take 1 tablet (1,000 mg total) by mouth 2 (two) times daily for 7days 10/08/23     furosemide  (LASIX ) 20 MG tablet Take 1 tablet (20 mg total) by mouth daily. 04/08/24 04/29/24    linaclotide  (LINZESS ) 72 MCG capsule Take 1 capsule (72 mcg total) by mouth daily. 02/19/23 04/23/23    XULANE 150-35 MCG/24HR transdermal patch 1 patch once a week. 09/04/19 11/18/19  [provider]    Family History Family History  Problem Relation Age of Onset   Hypertension Mother    Alcohol abuse Mother    Liver disease Mother    Other Father        unknown medical history   Hypertension Maternal Grandmother    Diabetes Maternal Grandmother    Dementia Maternal Grandmother    Autoimmune disease Daughter     Social  History Social History   Tobacco Use   Smoking status: Never   Smokeless tobacco: Never  Vaping Use   Vaping  status: Never Used  Substance Use Topics   Alcohol use: Yes    Alcohol/week: 1.0 standard drink of alcohol    Types: 1 Glasses of wine per week    Comment: occ wine   Drug use: No     Allergies   Vicodin [hydrocodone -acetaminophen ]   Review of Systems Review of Systems: negative unless otherwise stated in HPI.      Physical Exam Triage Vital Signs ED Triage Vitals  Encounter Vitals Group     BP 05/04/24 1531 123/83     Girls Systolic BP Percentile --      Girls Diastolic BP Percentile --      Boys Systolic BP Percentile --      Boys Diastolic BP Percentile --      Pulse Rate 05/04/24 1531 (!) 120     Resp 05/04/24 1531 18     Temp 05/04/24 1531 98.6 F (37 C)     Temp Source 05/04/24 1531 Oral     SpO2 05/04/24 1531 99 %     Weight --      Height --      Head Circumference --      Peak Flow --      Pain Score 05/04/24 1528 2     Pain Loc --      Pain Education --      Exclude from Growth Chart --    No data found.  Updated Vital Signs BP 123/83 (BP Location: Right Arm)   Pulse (!) 120   Temp 98.6 F (37 C) (Oral)   Resp 18   LMP 10/27/2021 (Exact Date) Comment: having depot shots  SpO2 99%   Visual Acuity Right Eye Distance:   Left Eye Distance:   Bilateral Distance:    Right Eye Near:   Left Eye Near:    Bilateral Near:     Physical Exam GEN:     alert, cooperative and no distress ***   HENT:  mucus membranes moist, oropharyngeal without lesions or erythema ***,  nares patent, no nasal discharge *** EYES:   pupils equal and reactive, EOM intact NECK:  supple, normal ROM, no lymphadenopathy *** RESP:  clear to auscultation bilaterally, no increased work of breathing *** CVS:   regular rate and rhythm, no murmur, distal pulses intact  *** ABD:  soft, non-tender; bowel sounds present; no palpable masses,  *** EXT:   normal ROM, atraumatic, no edema *** NEURO:  normal without focal findings,  speech normal, alert and oriented  *** Skin:   warm and  dry, no rash***, normal*** skin turgor Psych: Normal affect, appropriate speech and behavior      UC Treatments / Results  Labs (all labs ordered are listed, but only abnormal results are displayed) Labs Reviewed - No data to display  EKG  If EKG performed, see my interpretation in the MDM section  Radiology No results found.   Procedures Procedures (including critical care time)  Medications Ordered in UC Medications - No data to display  Initial Impression / Assessment and Plan / UC Course  I have reviewed the triage vital signs and the nursing notes.  Pertinent labs & imaging results that were available during my care of the patient were reviewed by me and considered in my medical decision making (see chart for details).  Pt is a 43 y.o. female with history of *** who presents for ***room spinning dizziness for the past 3 days.  ***She does not have a headache. There has been no syncope.  I doubt acute cerebellar CVA. History not consistent with complex migraine. She does ***have upper respiratory symptoms to suggest labyrinthitis. No hearing loss to suggest Mnire's disease. No foreign bodies seen. Recent TSH *** Recent head imaging.*** On exam, she has signs of ***vertigo and an acute ear infection.    Treat with meclizine and antibiotics.  Showed patient how to perform Epley maneuver at home.  Handout also given.   ED precautions reviewed and patient voiced understanding. Pt may need vestibular rehab, if symptoms not improving.    Final Clinical Impressions(s) / UC Diagnoses   Final diagnoses:  None   Discharge Instructions   None    ED Prescriptions   None    PDMP not reviewed this encounter.

## 2024-05-04 NOTE — Discharge Instructions (Addendum)
 For you headache:  Follow up with your primary care provider to speak about headaches as you may need imaging of your head or to see a neurologist.    Take 1 tablet benadryl , 1 tablet of compazine , 2 tablets of Tylenol  with 600 mg Ibuprofen  for your headache. Stop by the pharmacy to pick up your prescriptions.    Speak with your primary care doctor about switching to Lexapro  or Celexa for anxiety.    Take the prednisone  taper to help with your headache and the fluid in your ear.

## 2024-05-04 NOTE — ED Triage Notes (Signed)
 Pt presents with dizziness, nausea and loss of appetite x 2 days. Pt has taken her migraine medication to help with symptoms. Pt started taken Zoloft 2 days ago.

## 2024-05-16 DIAGNOSIS — R079 Chest pain, unspecified: Secondary | ICD-10-CM | POA: Diagnosis not present

## 2024-05-16 DIAGNOSIS — H6983 Other specified disorders of Eustachian tube, bilateral: Secondary | ICD-10-CM | POA: Diagnosis not present

## 2024-05-16 DIAGNOSIS — G43801 Other migraine, not intractable, with status migrainosus: Secondary | ICD-10-CM | POA: Diagnosis not present

## 2024-05-16 DIAGNOSIS — H9312 Tinnitus, left ear: Secondary | ICD-10-CM | POA: Diagnosis not present

## 2024-05-18 ENCOUNTER — Ambulatory Visit: Admitting: Dermatology

## 2024-05-18 ENCOUNTER — Other Ambulatory Visit: Payer: Self-pay

## 2024-05-24 ENCOUNTER — Other Ambulatory Visit: Payer: Self-pay

## 2024-05-24 DIAGNOSIS — R300 Dysuria: Secondary | ICD-10-CM | POA: Diagnosis not present

## 2024-05-24 DIAGNOSIS — R899 Unspecified abnormal finding in specimens from other organs, systems and tissues: Secondary | ICD-10-CM | POA: Diagnosis not present

## 2024-05-26 ENCOUNTER — Other Ambulatory Visit: Payer: Self-pay

## 2024-05-26 DIAGNOSIS — E669 Obesity, unspecified: Secondary | ICD-10-CM | POA: Diagnosis not present

## 2024-05-26 DIAGNOSIS — R7989 Other specified abnormal findings of blood chemistry: Secondary | ICD-10-CM | POA: Diagnosis not present

## 2024-05-26 DIAGNOSIS — I10 Essential (primary) hypertension: Secondary | ICD-10-CM | POA: Diagnosis not present

## 2024-05-26 DIAGNOSIS — F411 Generalized anxiety disorder: Secondary | ICD-10-CM | POA: Diagnosis not present

## 2024-05-26 MED ORDER — SERTRALINE HCL 50 MG PO TABS
75.0000 mg | ORAL_TABLET | Freq: Every day | ORAL | 0 refills | Status: DC
  Filled 2024-05-26: qty 45, 30d supply, fill #0

## 2024-06-06 ENCOUNTER — Other Ambulatory Visit: Payer: Self-pay

## 2024-06-07 ENCOUNTER — Other Ambulatory Visit: Payer: Self-pay

## 2024-06-10 ENCOUNTER — Other Ambulatory Visit: Payer: Self-pay

## 2024-06-16 ENCOUNTER — Other Ambulatory Visit: Payer: Self-pay

## 2024-07-07 ENCOUNTER — Other Ambulatory Visit: Payer: Self-pay

## 2024-07-07 MED ORDER — CYCLOBENZAPRINE HCL 10 MG PO TABS
10.0000 mg | ORAL_TABLET | Freq: Two times a day (BID) | ORAL | 0 refills | Status: DC | PRN
  Filled 2024-07-07: qty 20, 10d supply, fill #0

## 2024-07-07 MED ORDER — METFORMIN HCL ER 500 MG PO TB24
500.0000 mg | ORAL_TABLET | Freq: Every day | ORAL | 1 refills | Status: DC
  Filled 2024-07-07: qty 30, 30d supply, fill #0
  Filled 2024-09-26: qty 30, 30d supply, fill #1

## 2024-07-08 ENCOUNTER — Other Ambulatory Visit: Payer: Self-pay

## 2024-07-11 ENCOUNTER — Other Ambulatory Visit: Payer: Self-pay

## 2024-07-20 ENCOUNTER — Other Ambulatory Visit: Payer: Self-pay

## 2024-07-20 DIAGNOSIS — G43109 Migraine with aura, not intractable, without status migrainosus: Secondary | ICD-10-CM | POA: Diagnosis not present

## 2024-07-20 DIAGNOSIS — G5601 Carpal tunnel syndrome, right upper limb: Secondary | ICD-10-CM | POA: Diagnosis not present

## 2024-07-20 MED ORDER — EMGALITY 120 MG/ML ~~LOC~~ SOAJ
120.0000 mg | SUBCUTANEOUS | 11 refills | Status: AC
  Filled 2024-08-05: qty 1, 30d supply, fill #0
  Filled 2024-08-30: qty 1, 30d supply, fill #1
  Filled 2024-10-26: qty 1, 30d supply, fill #2
  Filled 2024-11-24: qty 1, 30d supply, fill #3
  Filled 2024-12-22: qty 1, 30d supply, fill #4

## 2024-07-20 MED ORDER — NURTEC 75 MG PO TBDP
75.0000 mg | ORAL_TABLET | Freq: Every day | ORAL | 3 refills | Status: DC | PRN
  Filled 2024-07-20: qty 16, 30d supply, fill #0
  Filled 2024-10-12: qty 16, 16d supply, fill #0
  Filled 2024-10-12: qty 16, 25d supply, fill #0
  Filled 2024-10-12 (×2): qty 16, 16d supply, fill #0

## 2024-08-04 ENCOUNTER — Ambulatory Visit

## 2024-08-05 ENCOUNTER — Other Ambulatory Visit: Payer: Self-pay

## 2024-08-08 ENCOUNTER — Other Ambulatory Visit: Payer: Self-pay

## 2024-08-09 ENCOUNTER — Other Ambulatory Visit: Payer: Self-pay

## 2024-08-09 MED ORDER — ESTRADIOL 0.1 MG/GM VA CREA
2.0000 g | TOPICAL_CREAM | VAGINAL | 0 refills | Status: AC
  Filled 2024-08-09: qty 42.5, 73d supply, fill #0

## 2024-08-10 ENCOUNTER — Other Ambulatory Visit: Payer: Self-pay

## 2024-08-30 ENCOUNTER — Other Ambulatory Visit: Payer: Self-pay

## 2024-08-30 DIAGNOSIS — R399 Unspecified symptoms and signs involving the genitourinary system: Secondary | ICD-10-CM | POA: Diagnosis not present

## 2024-09-26 ENCOUNTER — Other Ambulatory Visit: Payer: Self-pay

## 2024-10-12 ENCOUNTER — Other Ambulatory Visit: Payer: Self-pay

## 2024-10-12 ENCOUNTER — Telehealth: Admitting: Physician Assistant

## 2024-10-12 DIAGNOSIS — G43019 Migraine without aura, intractable, without status migrainosus: Secondary | ICD-10-CM

## 2024-10-12 MED ORDER — PREDNISONE 10 MG (21) PO TBPK: ORAL_TABLET | ORAL | 0 refills | Status: DC

## 2024-10-12 MED ORDER — ONDANSETRON 4 MG PO TBDP: 4.0000 mg | ORAL_TABLET | Freq: Three times a day (TID) | ORAL | 0 refills | Status: AC | PRN

## 2024-10-12 MED ORDER — CYCLOBENZAPRINE HCL 10 MG PO TABS
10.0000 mg | ORAL_TABLET | Freq: Two times a day (BID) | ORAL | 0 refills | Status: DC | PRN
  Filled 2024-10-12: qty 20, 10d supply, fill #0

## 2024-10-12 MED ORDER — ONDANSETRON HCL 4 MG PO TABS
4.0000 mg | ORAL_TABLET | Freq: Three times a day (TID) | ORAL | 0 refills | Status: AC | PRN
Start: 2024-10-12 — End: ?
  Filled 2024-10-12: qty 20, 7d supply, fill #0

## 2024-10-12 NOTE — Progress Notes (Signed)
 Virtual Visit Consent   Cindy Hays, you are scheduled for a virtual visit with a [facility] provider today. Just as with appointments in the office, your consent must be obtained to participate. Your consent will be active for this visit and any virtual visit you may have with one of our providers in the next 365 days. If you have a MyChart account, a copy of this consent can be sent to you electronically.  As this is a virtual visit, video technology does not allow for your provider to perform a traditional examination. This may limit your provider's ability to fully assess your condition. If your provider identifies any concerns that need to be evaluated in person or the need to arrange testing (such as labs, EKG, etc.), we will make arrangements to do so. Although advances in technology are sophisticated, we cannot ensure that it will always work on either your end or our end. If the connection with a video visit is poor, the visit may have to be switched to a telephone visit. With either a video or telephone visit, we are not always able to ensure that we have a secure connection.  By engaging in this virtual visit, you consent to the provision of healthcare and authorize for your insurance to be billed (if applicable) for the services provided during this visit. Depending on your insurance coverage, you may receive a charge related to this service.  I need to obtain your verbal consent now. Are you willing to proceed with your visit today? Cindy Hays has provided verbal consent on 10/12/2024 for a virtual visit (video or telephone). Cindy Hays, [place]  Date: 10/12/2024 5:55 PM   Virtual Visit via Video Note   I, Cindy Hays, connected with  Cindy Hays  ([id number], Feb 18, 1981) on 10/12/24 at  6:00 PM EST by a video-enabled telemedicine application and verified that I am speaking with the correct person using two identifiers.  Location: Patient: Virtual Visit  Location Patient: Home Provider: Virtual Visit Location Provider: Home Office   I discussed the limitations of evaluation and management by telemedicine and the availability of in person appointments. The patient expressed understanding and agreed to proceed.    History of Present Illness: Cindy Hays is a 43 y.o. who identifies as a female who was assigned female at birth, and is being seen today for episodes of her migraines over the past several days with current migraine present since yesterday. Notes classic migraine symptoms for her with unilateral headache, nausea and photophobia. Denies vision change or AMS. Denies fever, chills. No known trigger. Is taking her Monthly Emgality  injections. Has taken Nurtec for abortive therapy without success. Tried to get in with her Neurologist but no appts available.   HPI: HPI  Problems:  Patient Active Problem List   Diagnosis Date Noted   Complicated migraine 10/20/2023   Polyp of descending colon 06/25/2023   Excessive and frequent menstruation 05/12/2023   History of uterine fibroid 05/12/2023   Carpal tunnel syndrome of right wrist 03/12/2021   NSVT (nonsustained ventricular tachycardia) (HCC)    Chronic headaches 02/06/2020   Recurrent syncope 02/06/2020   Occipital neuralgia 02/06/2020   Sinus tachycardia 02/06/2020   ANA positive 02/06/2020   Syncope 02/06/2020   Intractable menstrual migraine without status migrainosus 01/31/2020   Iron deficiency anemia 10/28/2019   Pregnancy 08/04/2018   Indication for care in labor or delivery 08/03/2018   Labor and delivery indication for care or intervention 08/02/2018   Chronic hypertension  with superimposed pre-eclampsia 07/30/2018   Chronic hypertension with superimposed preeclampsia 07/21/2018   Short interval between pregnancies affecting pregnancy in first trimester, antepartum 03/18/2018   Advanced maternal age in multigravida, first trimester 03/18/2018   Hypertension 11/17/2017    Fetal demise, greater than 22 weeks, antepartum, single gestation 11/13/2017   Hx of preeclampsia, prior pregnancy, currently pregnant 10/22/2017   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   Herpes simplex 07/29/2017   Obesity (BMI 35.0-39.9 without comorbidity) 07/23/2017    Allergies:  Allergies  Allergen Reactions   Vicodin [Hydrocodone -Acetaminophen ] Rash   Medications:  Current Outpatient Medications:    ondansetron  (ZOFRAN -ODT) 4 MG disintegrating tablet, Take 1 tablet (4 mg total) by mouth every 8 (eight) hours as needed for nausea or vomiting., Disp: 20 tablet, Rfl: 0   predniSONE  (STERAPRED UNI-PAK 21 TAB) 10 MG (21) TBPK tablet, Take following package directions, Disp: 21 tablet, Rfl: 0   albuterol  (VENTOLIN  HFA) 108 (90 Base) MCG/ACT inhaler, Inhale 2 puffs into the lungs every 6 (six) hours as needed for wheezing or shortness of breath., Disp: 6.7 g, Rfl: 2   butalbital -acetaminophen -caffeine  (FIORICET ) 50-325-40 MG tablet, Take 2 tablets by mouth every 4 (four) hours as needed for pain (Patient not taking: Reported on 06/18/2023), Disp: 30 tablet, Rfl: 0   cyclobenzaprine  (FLEXERIL ) 10 MG tablet, Take 1 tablet (10 mg total) by mouth 2 (two) times daily as needed for Muscle spasms, Disp: 30 tablet, Rfl: 0   cyclobenzaprine  (FLEXERIL ) 10 MG tablet, Take 1 tablet (10 mg total) by mouth 2 (two) times daily as needed for Muscle spasms, Disp: 20 tablet, Rfl: 0   dihydroergotamine  (MIGRANAL ) 4 MG/ML nasal spray, Place into the nose., Disp: , Rfl:    Erenumab -aooe (AIMOVIG ) 140 MG/ML SOAJ, Inject 140 mg into the skin every 28 (twenty-eight) days., Disp: 1 mL, Rfl: 6   escitalopram  (LEXAPRO ) 10 MG tablet, Take 1 tablet (10 mg total) by mouth once daily (Patient taking differently: Take 5 mg by mouth daily.), Disp: 30 tablet, Rfl: 2   escitalopram  (LEXAPRO ) 10 MG tablet, Take 1 tablet (10 mg total) by mouth daily., Disp: 30 tablet, Rfl: 2   estradiol  (ESTRACE ) 0.1 MG/GM vaginal cream, , Disp: , Rfl:     estradiol  (ESTRACE ) 0.1 MG/GM vaginal cream, Place 2 g vaginally 2 (two) times a week., Disp: 42.5 g, Rfl: 1   estradiol  (ESTRACE ) 0.1 MG/GM vaginal cream, Place 2 g vaginally 2 (two) times a week., Disp: 42.5 g, Rfl: 0   ferrous sulfate  220 (44 Fe) MG/5ML solution, Take 220 mg by mouth 2 (two) times daily with a meal., Disp: , Rfl:    ferrous sulfate  220 (44 Fe) MG/5ML solution, Take 5 mLs (220 mg total) by mouth 2 (two) times daily., Disp: 473 mL, Rfl: 1   ferrous sulfate  220 (44 Fe) MG/5ML solution, Take 5 mLs (220 mg total) by mouth 2 (two) times daily., Disp: 473 mL, Rfl: 1   Fluocinolone  Acetonide Scalp (DERMA-SMOOTHE /FS SCALP) 0.01 % OIL, Apply 1 Application topically 1 to  2  times daily as needed., Disp: 118.28 mL, Rfl: 1   Galcanezumab -gnlm (EMGALITY ) 120 MG/ML SOAJ, Inject 120 mg into the skin every 28 (twenty-eight) days., Disp: 1 mL, Rfl: 11   Galcanezumab -gnlm (EMGALITY ) 120 MG/ML SOAJ, Inject 120 mg into the skin every 28 (twenty-eight) days., Disp: 1 mL, Rfl: 11   HYDROcodone -acetaminophen  (NORCO) 5-325 MG tablet, Take 1 tablet by mouth every 6 (six) hours as needed for up to 6 doses for moderate pain.,  Disp: 6 tablet, Rfl: 0   hydrocortisone  2.5 % ointment, Apply 1 Application topically 2 (two) times daily  for 7 days., Disp: 28.35 g, Rfl: 0   hydrOXYzine  (ATARAX ) 25 MG tablet, Take 1 tablet (25 mg total) by mouth 3 (three) times daily as needed for anxiety., Disp: 30 tablet, Rfl: 0   hydrOXYzine  (ATARAX ) 25 MG tablet, Take 1 tablet by mouth 3 (three) times daily as needed., Disp: , Rfl:    hydrOXYzine  (ATARAX ) 25 MG tablet, Take 1 tablet (25 mg total) by mouth every 6 (six) hours as needed for itching., Disp: 30 tablet, Rfl: 1   ibuprofen  (ADVIL ) 600 MG tablet, Take 1 tablet (600 mg total) by mouth every 4 (four) - 6 (six) hours., Disp: 10 tablet, Rfl: 0   ibuprofen  (ADVIL ) 600 MG tablet, Take 1 tablet (600mg ) by mouth every 4 to 6 hours., Disp: 10 tablet, Rfl: 0   Lasmiditan   Succinate 100 MG TABS, Take 1 tablet (100 mg total) by mouth as needed for migraine rescue. No more than 1 tablet every 24 hours., Disp: 8 tablet, Rfl: 1   metFORMIN  (GLUCOPHAGE -XR) 500 MG 24 hr tablet, Take 1 tablet (500 mg total) by mouth daily with supper., Disp: 30 tablet, Rfl: 1   ondansetron  (ZOFRAN ) 4 MG tablet, Take 1 tablet (4 mg total) by mouth every 8 (eight) hours as needed., Disp: 20 tablet, Rfl: 0   Rimegepant Sulfate  (NURTEC) 75 MG TBDP, Take 1 tablet (75 mg total) by mouth as directed at onset of migraine once a day as needed., Disp: 16 tablet, Rfl: 3   triamcinolone  ointment (KENALOG ) 0.5 %, Apply 1 Application topically 2 (two) times daily., Disp: 45 g, Rfl: 0   valACYclovir  (VALTREX ) 1000 MG tablet, Take 1 tablet (1,000 mg total) by mouth 2 (two) times daily for 7days, Disp: 14 tablet, Rfl: 0  Observations/Objective: Patient is well-developed, well-nourished in no acute distress.  Resting comfortably  at home.  Head is normocephalic, atraumatic.  No labored breathing.  Speech is clear and coherent with logical content.  Patient is alert and oriented at baseline.   Assessment and Plan: 1. Intractable migraine without aura and without status migrainosus (Primary) - predniSONE  (STERAPRED UNI-PAK 21 TAB) 10 MG (21) TBPK tablet; Take following package directions  Dispense: 21 tablet; Refill: 0 - ondansetron  (ZOFRAN -ODT) 4 MG disintegrating tablet; Take 1 tablet (4 mg total) by mouth every 8 (eight) hours as needed for nausea or vomiting.  Dispense: 20 tablet; Refill: 0  No alarm signs or symptoms present. Will start Prednisone  pack for abortive therapy to break this cycle. Supportive measures and OTC medications reviewed. Zofran  refilled. In-person precautions reviewed with patient.   Follow Up Instructions: I discussed the assessment and treatment plan with the patient. The patient was provided an opportunity to ask questions and all were answered. The patient agreed with the  plan and demonstrated an understanding of the instructions.  A copy of instructions were sent to the patient via MyChart unless otherwise noted below.   The patient was advised to call back or seek an in-person evaluation if the symptoms worsen or if the condition fails to improve as anticipated.    Cindy Velma Lunger, PA-C

## 2024-10-12 NOTE — Patient Instructions (Signed)
 Cindy Hays, thank you for joining Cindy Velma Lunger, PA-C for today's virtual visit.  While this provider is not your primary care provider (PCP), if your PCP is located in our provider database this encounter information will be shared with them immediately following your visit.   A [facility] MyChart account gives you access to today's visit and all your visits, tests, and labs performed at [facility]  click here if you don't have a [facility] MyChart account or go to [email or web address]  Consent: (Patient) Cindy Hays provided verbal consent for this virtual visit at the beginning of the encounter.  Current Medications:  Current Outpatient Medications:    albuterol  (VENTOLIN  HFA) 108 (90 Base) MCG/ACT inhaler, Inhale 2 puffs into the lungs every 6 (six) hours as needed for wheezing or shortness of breath., Disp: 6.7 g, Rfl: 2   butalbital -acetaminophen -caffeine  (FIORICET ) 50-325-40 MG tablet, Take 2 tablets by mouth every 4 (four) hours as needed for pain (Patient not taking: Reported on 06/18/2023), Disp: 30 tablet, Rfl: 0   cyclobenzaprine  (FLEXERIL ) 10 MG tablet, Take 1 tablet (10 mg total) by mouth 2 (two) times daily as needed for Muscle spasms, Disp: 30 tablet, Rfl: 0   cyclobenzaprine  (FLEXERIL ) 10 MG tablet, Take 1 tablet (10 mg total) by mouth 2 (two) times daily as needed for Muscle spasms, Disp: 20 tablet, Rfl: 0   dihydroergotamine  (MIGRANAL ) 4 MG/ML nasal spray, Place into the nose., Disp: , Rfl:    Erenumab -aooe (AIMOVIG ) 140 MG/ML SOAJ, Inject 140 mg into the skin every 28 (twenty-eight) days., Disp: 1 mL, Rfl: 6   escitalopram  (LEXAPRO ) 10 MG tablet, Take 1 tablet (10 mg total) by mouth once daily (Patient taking differently: Take 5 mg by mouth daily.), Disp: 30 tablet, Rfl: 2   escitalopram  (LEXAPRO ) 10 MG tablet, Take 1 tablet (10 mg total) by mouth daily., Disp: 30 tablet, Rfl: 2   estradiol  (ESTRACE ) 0.1 MG/GM vaginal cream, , Disp: , Rfl:     estradiol  (ESTRACE ) 0.1 MG/GM vaginal cream, Place 2 g vaginally 2 (two) times a week., Disp: 42.5 g, Rfl: 1   estradiol  (ESTRACE ) 0.1 MG/GM vaginal cream, Place 2 g vaginally 2 (two) times a week., Disp: 42.5 g, Rfl: 0   ferrous sulfate  220 (44 Fe) MG/5ML solution, Take 220 mg by mouth 2 (two) times daily with a meal., Disp: , Rfl:    ferrous sulfate  220 (44 Fe) MG/5ML solution, Take 5 mLs (220 mg total) by mouth 2 (two) times daily., Disp: 473 mL, Rfl: 1   ferrous sulfate  220 (44 Fe) MG/5ML solution, Take 5 mLs (220 mg total) by mouth 2 (two) times daily., Disp: 473 mL, Rfl: 1   Fluocinolone  Acetonide Scalp (DERMA-SMOOTHE /FS SCALP) 0.01 % OIL, Apply 1 Application topically 1 to  2  times daily as needed., Disp: 118.28 mL, Rfl: 1   Galcanezumab -gnlm (EMGALITY ) 120 MG/ML SOAJ, Inject 120 mg into the skin every 28 (twenty-eight) days., Disp: 1 mL, Rfl: 11   Galcanezumab -gnlm (EMGALITY ) 120 MG/ML SOAJ, Inject 120 mg into the skin every 28 (twenty-eight) days., Disp: 1 mL, Rfl: 11   HYDROcodone -acetaminophen  (NORCO) 5-325 MG tablet, Take 1 tablet by mouth every 6 (six) hours as needed for up to 6 doses for moderate pain., Disp: 6 tablet, Rfl: 0   hydrocortisone  2.5 % ointment, Apply 1 Application topically 2 (two) times daily  for 7 days., Disp: 28.35 g, Rfl: 0   hydrOXYzine  (ATARAX ) 25 MG tablet, Take 1 tablet (25 mg total) by mouth  3 (three) times daily as needed for anxiety., Disp: 30 tablet, Rfl: 0   hydrOXYzine  (ATARAX ) 25 MG tablet, Take 1 tablet by mouth 3 (three) times daily as needed., Disp: , Rfl:    hydrOXYzine  (ATARAX ) 25 MG tablet, Take 1 tablet (25 mg total) by mouth every 6 (six) hours as needed for itching., Disp: 30 tablet, Rfl: 1   ibuprofen  (ADVIL ) 600 MG tablet, Take 1 tablet (600 mg total) by mouth every 4 (four) - 6 (six) hours., Disp: 10 tablet, Rfl: 0   ibuprofen  (ADVIL ) 600 MG tablet, Take 1 tablet (600mg ) by mouth every 4 to 6 hours., Disp: 10 tablet, Rfl: 0   Lasmiditan   Succinate 100 MG TABS, Take 1 tablet (100 mg total) by mouth as needed for migraine rescue. No more than 1 tablet every 24 hours., Disp: 8 tablet, Rfl: 1   meclizine  (ANTIVERT ) 25 MG tablet, Take 1 tablet (25 mg total) by mouth 3 (three) times daily as needed for dizziness., Disp: 30 tablet, Rfl: 0   metFORMIN  (GLUCOPHAGE -XR) 500 MG 24 hr tablet, Take 1 tablet (500 mg total) by mouth daily with supper., Disp: 30 tablet, Rfl: 1   ondansetron  (ZOFRAN ) 4 MG tablet, Take 1 tablet (4 mg total) by mouth every 8 (eight) hours as needed., Disp: 20 tablet, Rfl: 0   permethrin  (ELIMITE ) 5 % cream, Apply cream from head to feet, leave on for 8 to 14 hours, then wash with soap/water , repeat application if living mites present 14 days after initial treatment, Disp: 60 g, Rfl: 0   predniSONE  (STERAPRED UNI-PAK 21 TAB) 10 MG (21) TBPK tablet, Take by mouth daily. Take 6 tabs by mouth daily for 1, then 5 tabs for 1 day, then 4 tabs for 1 day, then 3 tabs for 1 day, then 2 tabs for 1 day, then 1 tab for 1 day., Disp: 21 tablet, Rfl: 0   prochlorperazine  (COMPAZINE ) 10 MG tablet, Take 1 tablet (10 mg total) by mouth 2 (two) times daily as needed for nausea or vomiting., Disp: 20 tablet, Rfl: 0   Rimegepant Sulfate  (NURTEC) 75 MG TBDP, Take 1 tablet (75 mg total) by mouth as directed at onset of migraine once a day as needed., Disp: 16 tablet, Rfl: 3   Rimegepant Sulfate  (NURTEC) 75 MG TBDP, Take 1 tablet (75 mg total) by mouth daily as needed., Disp: 16 tablet, Rfl: 3   sertraline  (ZOLOFT ) 50 MG tablet, Take 1 tablet (50 mg total) by mouth daily. Start with 0.5 tablet (25 mg) for first week, if tolerating well, take the full tablet daily., Disp: 30 tablet, Rfl: 1   sertraline  (ZOLOFT ) 50 MG tablet, Take 1.5 tablets (75 mg total) by mouth daily. Start with half tablet daily for the first week, if tolerating well, take the full tablet daily., Disp: 45 tablet, Rfl: 0   triamcinolone  ointment (KENALOG ) 0.5 %, Apply 1  Application topically 2 (two) times daily., Disp: 45 g, Rfl: 0   valACYclovir  (VALTREX ) 1000 MG tablet, Take 1 tablet (1,000 mg total) by mouth 2 (two) times daily for 7days, Disp: 14 tablet, Rfl: 0   Medications ordered in this encounter:  No orders of the defined types were placed in this encounter.    *If you need refills on other medications prior to your next appointment, please contact your pharmacy*  Follow-Up: Call back or seek an in-person evaluation if the symptoms worsen or if the condition fails to improve as anticipated.  [facility] [phone])  [phone]  Other Instructions Please hydrate and rest. Take the prednisone  pack as directed. Can use OTC Tylenol  with this as needed. If the headache is not resolving and headache cycle broken, or any worsening symptoms, I want you to be evaluated in person ASAP.   If you have been instructed to have an in-person evaluation today at a local Urgent Care facility, please use the link below. It will take you to a list of all of our available [facility], including address, phone number and hours of operation. Please do not delay care.  [facility]  If you or a family member do not have a primary care provider, use the link below to schedule a visit and establish care. When you choose a [facility] primary care physician or advanced practice provider, you gain a long-term partner in health. Find a Primary Care Provider  Learn more about [facility]'s in-office and virtual care options: [facility] - Get Care Now

## 2024-10-14 ENCOUNTER — Other Ambulatory Visit: Payer: Self-pay

## 2024-10-15 ENCOUNTER — Other Ambulatory Visit: Payer: Self-pay

## 2024-10-17 ENCOUNTER — Other Ambulatory Visit: Payer: Self-pay

## 2024-10-18 ENCOUNTER — Other Ambulatory Visit: Payer: Self-pay

## 2024-10-18 MED ORDER — NURTEC 75 MG PO TBDP
75.0000 mg | ORAL_TABLET | Freq: Every day | ORAL | 3 refills | Status: AC | PRN
  Filled 2024-10-18: qty 16, 30d supply, fill #0

## 2024-10-19 ENCOUNTER — Ambulatory Visit
Admission: EM | Admit: 2024-10-19 | Discharge: 2024-10-19 | Disposition: A | Discharge status: Home / Self Care | Attending: Emergency Medicine | Admitting: Emergency Medicine

## 2024-10-19 DIAGNOSIS — R519 Headache, unspecified: Secondary | ICD-10-CM | POA: Insufficient documentation

## 2024-10-19 DIAGNOSIS — M62838 Other muscle spasm: Secondary | ICD-10-CM | POA: Diagnosis not present

## 2024-10-19 DIAGNOSIS — G43019 Migraine without aura, intractable, without status migrainosus: Secondary | ICD-10-CM

## 2024-10-19 MED ORDER — PREDNISONE 10 MG (21) PO TBPK: ORAL_TABLET | ORAL | 0 refills | Status: AC

## 2024-10-19 NOTE — ED Provider Notes (Signed)
 [facility]    CSN: [id number] Arrival date & time: 10/19/24  0909      History   Chief Complaint Chief Complaint  Patient presents with   Headache    HPI Cindy Hays is a 43 y.o. female.   43 year old female, presents to urgent care for evaluation of intermittent headache, posterior neck/ right shoulder burning sensation,  pressure behind eyes for 2 days.  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 Patient had a E visit on 11/26 for the same,received prescription of prednisone  and Zofran  but son threw the medicine out the car so patient has not been able to start meds, report prednisone  and flexeril  usually help. Has appt with neurologist next week. Pt does not have any loss of vision, not worst HA of life,needs work note.    The history is provided by the patient. No language interpreter was used.    Past Medical History:  Diagnosis Date   Anemia    Arthritis    B12 deficiency    Cervical lymphadenopathy 03/26/2010   Contraceptive management 01/19/2008   Dysuria 11/13/2014   Fatigue 06/15/2009   Gallstones 10/2022   High-risk pregnancy 06/27/2017   43 y.o. [id number] at [redacted]w[redacted]d by  LMP c/w  week ultrasound  Sex of baby and name: boy     Factors complicating this pregnancy   1. Hx IUFD last pregnancy at 26.4wks (10/2017)   MFM referral placed- seen by MFM 5/2; anti-beta 2 glycoprotein - negative   Plan q4wk growth US    antepartum testing nst  2x/ week + weekly NST   2. CHTN on  meds since postpartum last delivery 11/2017- currently taking Pro   History of gestational hypertension    Hypertension    Migraine    Nipple discharge 11/13/2014   Papanicolaou smear of cervix with low grade squamous intraepithelial lesion (LGSIL) 11/16/2014   Pregnancy test positive 06/15/2017   Preterm labor    Uterine leiomyoma     Patient Active Problem List   Diagnosis Date Noted   Muscle spasms  of neck 10/19/2024   Complicated migraine 10/20/2023   Polyp of descending colon 06/25/2023   Excessive and frequent menstruation 05/12/2023   History of uterine fibroid 05/12/2023   Carpal tunnel syndrome of right wrist 03/12/2021   NSVT (nonsustained ventricular tachycardia) (HCC)    Bad headache 02/06/2020   Recurrent syncope 02/06/2020   Occipital neuralgia 02/06/2020   Sinus tachycardia 02/06/2020   ANA positive 02/06/2020   Syncope 02/06/2020   Intractable menstrual migraine without status migrainosus 01/31/2020   Iron deficiency anemia 10/28/2019   Pregnancy 08/04/2018   Indication for care in labor or delivery 08/03/2018   Labor and delivery indication for care or intervention 08/02/2018   Chronic hypertension with superimposed pre-eclampsia 07/30/2018   Chronic hypertension with superimposed preeclampsia 07/21/2018   Short interval between pregnancies affecting pregnancy in first trimester, antepartum 03/18/2018   Advanced maternal age in multigravida, first trimester 03/18/2018   Hypertension 11/17/2017   Fetal demise, greater than 22 weeks, antepartum, single gestation 11/13/2017   Hx of preeclampsia, prior pregnancy, currently pregnant 10/22/2017   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   Herpes simplex 07/29/2017   Obesity (BMI 35.0-39.9 without comorbidity) 07/23/2017    Past Surgical History:  Procedure Laterality Date   COLONOSCOPY WITH PROPOFOL  N/A 06/25/2023   Procedure: COLONOSCOPY WITH PROPOFOL ;  Surgeon: Jinny Carmine, MD;  Location: [facility];  Service: Endoscopy;  Laterality: N/A;   ESOPHAGOGASTRODUODENOSCOPY (EGD) WITH PROPOFOL  N/A 06/25/2023   Procedure: ESOPHAGOGASTRODUODENOSCOPY (EGD) WITH PROPOFOL ;  Surgeon: Jinny Carmine, MD;  Location: [facility];  Service: Endoscopy;  Laterality: N/A;   POLYPECTOMY  06/25/2023   Procedure: POLYPECTOMY;  Surgeon: Jinny Carmine, MD;  Location: [facility];  Service: Endoscopy;;   ROBOTIC ASSISTED LAPAROSCOPIC  HYSTERECTOMY AND SALPINGECTOMY Bilateral 12/20/2021   Procedure: XI ROBOTIC ASSISTED LAPAROSCOPIC HYSTERECTOMY AND SALPINGECTOMY;  Surgeon: Verdon Keen, MD;  Location: [facility] ORS;  Service: Gynecology;  Laterality: Bilateral;    OB History     Gravida  6   Para  6   Term  4   Preterm  1   AB      Living  5      SAB      IAB      Ectopic      Multiple  0   Live Births  1            Home Medications    Prior to Admission medications   Medication Sig Start Date End Date Taking? Authorizing Provider  loratadine  (CLARITIN ) 10 MG tablet Take 10 mg by mouth daily. 02/03/20 05/13/20  [provider]  albuterol  (VENTOLIN  HFA) 108 (90 Base) MCG/ACT inhaler Inhale 2 puffs into the lungs every 6 (six) hours as needed for wheezing or shortness of breath. 04/16/23   Viviann Pastor, MD  butalbital -acetaminophen -caffeine  (FIORICET ) 50-325-40 MG tablet Take 2 tablets by mouth every 4 (four) hours as needed for pain Patient not taking: Reported on 06/18/2023 02/19/23   Harvey Gaetana CROME, NP  cyclobenzaprine  (FLEXERIL ) 10 MG tablet Take 1 tablet (10 mg total) by mouth 2 (two) times daily as needed for Muscle spasms 02/21/24   Brimage, Vondra, DO  cyclobenzaprine  (FLEXERIL ) 10 MG tablet Take 1 tablet (10 mg total) by mouth 2 (two) times daily as needed for Muscle spasms 10/12/24     dihydroergotamine  (MIGRANAL ) 4 MG/ML nasal spray Place into the nose. 03/11/23   [provider]  Erenumab -aooe (AIMOVIG ) 140 MG/ML SOAJ Inject 140 mg into the skin every 28 (twenty-eight) days. 12/24/22     escitalopram  (LEXAPRO ) 10 MG tablet Take 1 tablet (10 mg total) by mouth once daily Patient taking differently: Take 5 mg by mouth daily. 09/11/23     escitalopram  (LEXAPRO ) 10 MG tablet Take 1 tablet (10 mg total) by mouth daily. 09/11/23     estradiol  (ESTRACE ) 0.1 MG/GM vaginal cream     [provider]  estradiol  (ESTRACE ) 0.1 MG/GM vaginal cream Place 2 g vaginally 2 (two)  times a week. 12/15/22     estradiol  (ESTRACE ) 0.1 MG/GM vaginal cream Place 2 g vaginally 2 (two) times a week. 08/11/24     ferrous sulfate  220 (44 Fe) MG/5ML solution Take 220 mg by mouth 2 (two) times daily with a meal.    [provider]  ferrous sulfate  220 (44 Fe) MG/5ML solution Take 5 mLs (220 mg total) by mouth 2 (two) times daily. 04/17/23     ferrous sulfate  220 (44 Fe) MG/5ML solution Take 5 mLs (220 mg total) by mouth 2 (two) times daily. 04/13/24     Fluocinolone  Acetonide Scalp (DERMA-SMOOTHE /FS SCALP) 0.01 % OIL Apply 1 Application topically 1 to  2  times daily as needed. 04/25/24   Jackquline Sawyer, MD  Galcanezumab -gnlm (EMGALITY ) 120 MG/ML SOAJ Inject 120 mg into the skin every 28 (twenty-eight) days. 10/19/23     Galcanezumab -gnlm (  EMGALITY ) 120 MG/ML SOAJ Inject 120 mg into the skin every 28 (twenty-eight) days. 07/20/24     HYDROcodone -acetaminophen  (NORCO) 5-325 MG tablet Take 1 tablet by mouth every 6 (six) hours as needed for up to 6 doses for moderate pain. 11/06/22   Tye, Isami, DO  hydrocortisone  2.5 % ointment Apply 1 Application topically 2 (two) times daily  for 7 days. 01/21/23     hydrOXYzine  (ATARAX ) 25 MG tablet Take 1 tablet (25 mg total) by mouth 3 (three) times daily as needed for anxiety. 04/13/24   Brimage, Vondra, DO  hydrOXYzine  (ATARAX ) 25 MG tablet Take 1 tablet by mouth 3 (three) times daily as needed.    [provider]  hydrOXYzine  (ATARAX ) 25 MG tablet Take 1 tablet (25 mg total) by mouth every 6 (six) hours as needed for itching. 04/28/24   Arvis Jolan NOVAK, PA-C  ibuprofen  (ADVIL ) 600 MG tablet Take 1 tablet (600 mg total) by mouth every 4 (four) - 6 (six) hours. 12/03/23     ibuprofen  (ADVIL ) 600 MG tablet Take 1 tablet (600mg ) by mouth every 4 to 6 hours. 04/07/24     Lasmiditan  Succinate 100 MG TABS Take 1 tablet (100 mg total) by mouth as needed for migraine rescue. No more than 1 tablet every 24 hours. 10/19/23     metFORMIN  (GLUCOPHAGE -XR) 500  MG 24 hr tablet Take 1 tablet (500 mg total) by mouth daily with supper. 07/07/24     ondansetron  (ZOFRAN ) 4 MG tablet Take 1 tablet (4 mg total) by mouth every 8 (eight) hours as needed. 10/12/24     ondansetron  (ZOFRAN -ODT) 4 MG disintegrating tablet Take 1 tablet (4 mg total) by mouth every 8 (eight) hours as needed for nausea or vomiting. 10/12/24   Gladis Elsie BROCKS, PA-C  predniSONE  (STERAPRED UNI-PAK 21 TAB) 10 MG (21) TBPK tablet Take following package directions 10/19/24   Kyanne Rials, Rilla, NP  Rimegepant Sulfate  (NURTEC) 75 MG TBDP Take 1 tablet (75 mg total) by mouth as directed at onset of migraine once a day as needed. 11/17/23     Rimegepant Sulfate  (NURTEC) 75 MG TBDP Take 1 tablet (75 mg total) by mouth daily as needed. 10/18/24     triamcinolone  ointment (KENALOG ) 0.5 % Apply 1 Application topically 2 (two) times daily. 04/28/24   Arvis Jolan B, PA-C  valACYclovir  (VALTREX ) 1000 MG tablet Take 1 tablet (1,000 mg total) by mouth 2 (two) times daily for 7days 10/08/23     furosemide  (LASIX ) 20 MG tablet Take 1 tablet (20 mg total) by mouth daily. 04/08/24 04/29/24    linaclotide  (LINZESS ) 72 MCG capsule Take 1 capsule (72 mcg total) by mouth daily. 02/19/23 04/23/23    XULANE 150-35 MCG/24HR transdermal patch 1 patch once a week. 09/04/19 11/18/19  [provider]    Family History Family History  Problem Relation Age of Onset   Hypertension Mother    Alcohol abuse Mother    Liver disease Mother    Other Father        unknown medical history   Hypertension Maternal Grandmother    Diabetes Maternal Grandmother    Dementia Maternal Grandmother    Autoimmune disease Daughter     Social History Social History   Tobacco Use   Smoking status: Never   Smokeless tobacco: Never  Vaping Use   Vaping status: Never Used  Substance Use Topics   Alcohol use: Yes    Alcohol/week: 1.0 standard drink of alcohol    Types:  1 Glasses of wine per week    Comment: occ wine   Drug  use: No     Allergies   Vicodin [hydrocodone -acetaminophen ]   Review of Systems Review of Systems  Constitutional:  Negative for fever.  HENT:  Positive for ear pain.   Eyes:  Positive for photophobia.  Musculoskeletal:  Positive for myalgias and neck pain.  Neurological:  Positive for headaches.  All other systems reviewed and are negative.    Physical Exam Triage Vital Signs ED Triage Vitals  Encounter Vitals Group     BP 10/19/24 0943 122/81     Girls Systolic BP Percentile --      Girls Diastolic BP Percentile --      Boys Systolic BP Percentile --      Boys Diastolic BP Percentile --      Pulse Rate 10/19/24 0943 90     Resp 10/19/24 0943 16     Temp 10/19/24 0943 98.9 F (37.2 C)     Temp Source 10/19/24 0943 Oral     SpO2 10/19/24 0943 96 %     Weight 10/19/24 0940 208 lb 6.4 oz (94.5 kg)     Height --      Head Circumference --      Peak Flow --      Pain Score 10/19/24 0943 8     Pain Loc --      Pain Education --      Exclude from Growth Chart --    No data found.  Updated Vital Signs BP 122/81 (BP Location: Right Arm)   Pulse 90   Temp 98.9 F (37.2 C) (Oral)   Resp 16   Wt 208 lb 6.4 oz (94.5 kg)   LMP 10/27/2021 (Exact Date) Comment: having depot shots  SpO2 96%   BMI 40.70 kg/m   Visual Acuity Right Eye Distance:   Left Eye Distance:   Bilateral Distance:    Right Eye Near:   Left Eye Near:    Bilateral Near:     Physical Exam Vitals and nursing note reviewed.  Constitutional:      Appearance: She is well-developed and well-groomed.  HENT:     Head: Normocephalic.     Comments: Headache from posterior cervical that comes up occipital area and comes around head like band, +TTP, no stepoff Eyes:     Pupils: Pupils are equal, round, and reactive to light.  Cardiovascular:     Rate and Rhythm: Normal rate and regular rhythm.     Heart sounds: Normal heart sounds.  Pulmonary:     Effort: Pulmonary effort is normal.     Breath  sounds: Normal breath sounds and air entry.  Musculoskeletal:     Cervical back: No rigidity. Pain with movement and muscular tenderness present. No spinous process tenderness.  Neurological:     General: No focal deficit present.     Mental Status: She is alert and oriented to person, place, and time.     GCS: GCS eye subscore is 4. GCS verbal subscore is 5. GCS motor subscore is 6.  Psychiatric:        Attention and Perception: Attention normal.        Mood and Affect: Mood normal.        Speech: Speech normal.        Behavior: Behavior normal. Behavior is cooperative.      UC Treatments / Results  Labs (all labs ordered are listed, but only abnormal  results are displayed) Labs Reviewed - No data to display  EKG   Radiology No results found.  Procedures Procedures (including critical care time)  Medications Ordered in UC Medications - No data to display  Initial Impression / Assessment and Plan / UC Course  I have reviewed the triage vital signs and the nursing notes.  Pertinent labs & imaging results that were available during my care of the patient were reviewed by me and considered in my medical decision making (see chart for details).     Discussed exam findings and plan of care with patient, strict go to ER precautions given.   Patient verbalized understanding to this provider. Pt has flexeril  but no prednisone , will refill med. Keep HA log,avoid known triggers, work note given, keep neurology appt. If you have worst HA of life or worsening symptoms, go to ER for further evaluation  Ddx: Migraine versus tension headache, viral illness,allergies Final Clinical Impressions(s) / UC Diagnoses   Final diagnoses:  Bad headache  Muscle spasms of neck     Discharge Instructions      Please keep neurology appt Take prednisone  as prescribed Follow up with PCP Avoid stress, take flexeril  as previously prescribed.  If you have new or worsening symptoms or worst  headache of life go to the emergency room for further evaluation     ED Prescriptions     Medication Sig Dispense Auth. Provider   predniSONE  (STERAPRED UNI-PAK 21 TAB) 10 MG (21) TBPK tablet Take following package directions 21 tablet Hisham Provence, Rilla, NP      PDMP not reviewed this encounter.   Aminta Rilla, NP 10/19/24 2004

## 2024-10-19 NOTE — Discharge Instructions (Addendum)
 Please keep neurology appt Take prednisone  as prescribed Follow up with PCP Avoid stress, take flexeril  as previously prescribed.  If you have new or worsening symptoms or worst headache of life go to the emergency room for further evaluation

## 2024-10-19 NOTE — ED Triage Notes (Signed)
 Pt c/o HA,blurred vision,L ear pain & pressure behind eyes x1 wk. Hx of migraines. Had E-visit on 11/26 for the same issue. Given pred & zofran  but unable to start d/t son throwing out meds from car.

## 2024-10-20 ENCOUNTER — Other Ambulatory Visit: Payer: Self-pay

## 2024-10-20 MED ORDER — QULIPTA 60 MG PO TABS
60.0000 mg | ORAL_TABLET | Freq: Every day | ORAL | 5 refills | Status: DC
  Filled 2024-10-20: qty 30, 30d supply, fill #0

## 2024-10-21 ENCOUNTER — Other Ambulatory Visit: Payer: Self-pay

## 2024-10-25 ENCOUNTER — Other Ambulatory Visit: Payer: Self-pay

## 2024-10-25 ENCOUNTER — Other Ambulatory Visit (HOSPITAL_COMMUNITY): Payer: Self-pay

## 2024-10-27 ENCOUNTER — Other Ambulatory Visit: Payer: Self-pay

## 2024-10-27 ENCOUNTER — Emergency Department

## 2024-10-27 ENCOUNTER — Emergency Department: Admission: EM | Admit: 2024-10-27 | Discharge: 2024-10-27 | Disposition: A | Discharge status: Home / Self Care

## 2024-10-27 DIAGNOSIS — R42 Dizziness and giddiness: Secondary | ICD-10-CM | POA: Diagnosis not present

## 2024-10-27 DIAGNOSIS — I10 Essential (primary) hypertension: Secondary | ICD-10-CM | POA: Insufficient documentation

## 2024-10-27 DIAGNOSIS — R0789 Other chest pain: Secondary | ICD-10-CM | POA: Insufficient documentation

## 2024-10-27 DIAGNOSIS — R079 Chest pain, unspecified: Secondary | ICD-10-CM | POA: Diagnosis not present

## 2024-10-27 LAB — BASIC METABOLIC PANEL WITH GFR
Anion gap: 9 (ref 5–15)
BUN: 8 mg/dL (ref 6–20)
CO2: 25 mmol/L (ref 22–32)
Calcium: 9.5 mg/dL (ref 8.9–10.3)
Chloride: 104 mmol/L (ref 98–111)
Creatinine, Ser: 0.7 mg/dL (ref 0.44–1.00)
GFR, Estimated: >60 mL/min (ref >60)
Glucose, Bld: 118 mg/dL — ABNORMAL HIGH (ref 70–99)
Potassium: 3.6 mmol/L (ref 3.5–5.1)
Sodium: 139 mmol/L (ref 135–145)

## 2024-10-27 LAB — CBC
HCT: 36.8 % (ref 36.0–46.0)
Hemoglobin: 11.9 g/dL — ABNORMAL LOW (ref 12.0–15.0)
MCH: 27.5 pg (ref 26.0–34.0)
MCHC: 32.3 g/dL (ref 30.0–36.0)
MCV: 85.2 fL (ref 80.0–100.0)
Platelets: 329 K/uL (ref 150–400)
RBC: 4.32 MIL/uL (ref 3.87–5.11)
RDW: 15.2 % (ref 11.5–15.5)
WBC: 8.8 K/uL (ref 4.0–10.5)
nRBC: 0 % (ref 0.0–0.2)

## 2024-10-27 LAB — D-DIMER, QUANTITATIVE: D-Dimer, Quant: 0.41 ug{FEU}/mL (ref 0.00–0.50)

## 2024-10-27 LAB — TROPONIN T, HIGH SENSITIVITY
Troponin T High Sensitivity: <15 ng/L (ref 0–19)
Troponin T High Sensitivity: <15 ng/L (ref 0–19)

## 2024-10-27 MED ORDER — LIDOCAINE 5 % EX PTCH
1.0000 | MEDICATED_PATCH | CUTANEOUS | Status: DC
  Filled 2024-10-27: qty 1

## 2024-10-27 MED ORDER — ACETAMINOPHEN 500 MG PO TABS
1000.0000 mg | ORAL_TABLET | Freq: Once | ORAL | Status: AC
  Administered 2024-10-27: 1000 mg via ORAL
  Filled 2024-10-27: qty 2

## 2024-10-27 MED ORDER — IBUPROFEN 200 MG PO TABS
600.0000 mg | ORAL_TABLET | Freq: Three times a day (TID) | ORAL | 2 refills | Status: AC | PRN
  Filled 2024-10-27: qty 100, 11d supply, fill #0

## 2024-10-27 MED ORDER — LIDOCAINE 5 % EX PTCH
1.0000 | MEDICATED_PATCH | CUTANEOUS | 0 refills | Status: AC
  Filled 2024-10-27: qty 10, 10d supply, fill #0

## 2024-10-27 MED ORDER — SODIUM CHLORIDE 0.9 % IV BOLUS
1000.0000 mL | Freq: Once | INTRAVENOUS | Status: AC
  Administered 2024-10-27: 1000 mL via INTRAVENOUS

## 2024-10-27 MED ORDER — ACETAMINOPHEN 500 MG PO TABS
1000.0000 mg | ORAL_TABLET | Freq: Four times a day (QID) | ORAL | 2 refills | Status: AC | PRN
  Filled 2024-10-27: qty 100, 13d supply, fill #0

## 2024-10-27 NOTE — ED Provider Notes (Signed)
 [facility] Provider Note    Event Date/Time   First MD Initiated Contact with Patient 10/27/24 1131     (approximate)   History   Dizziness and Chest Pain  Pt arrives via POV with c/o dizziness, CP and HA that started this morning as they were going about their day. The HA is on the right side around their temple/behind their eye, they CP is a tightness in the upper left side that travels to their shoulder, under their armpit to their back. Pt denies SOB. Pt is A&Ox4 and ambulatory during triage.   HPI Cindy Hays is a 43 y.o. female PMH anemia, hypertension, migraines, and SVT, uterine fibroid, sinus tachycardia presents for evaluation of chest pain, dizziness, headache - Patient is primarily here for left-sided chest pain radiating to her back that started about 2 hours prior to arrival.  No preceding trauma but notes she has been moving and lifting heavy objects recently.  Does note pain with ranging of her left shoulder and tenderness over her left upper outer chest. -No cough, shortness of breath -No leg swelling, no history of DVT/PE, no recent surgery constipation/travel, no systemic hormone use -Separately notes that she has a moderate headache, 5/10, gradual onset, right-sided.  Denies any focal weakness or vision changes.  No neck pain or stiffness.  No fevers.  Last head imaging is a CT head in July 2024, unremarkable.  Also had MRA head in February 2024, unremarkable.     Physical Exam   Triage Vital Signs: ED Triage Vitals  Encounter Vitals Group     BP 10/27/24 1028 137/83     Girls Systolic BP Percentile --      Girls Diastolic BP Percentile --      Boys Systolic BP Percentile --      Boys Diastolic BP Percentile --      Pulse Rate 10/27/24 1028 (!) 132     Resp 10/27/24 1028 16     Temp 10/27/24 1028 98.1 F (36.7 C)     Temp Source 10/27/24 1028 Oral     SpO2 10/27/24 1028 100 %     Weight 10/27/24 1033 207 lb (93.9 kg)      Height 10/27/24 1033 5' 1 (1.549 m)     Head Circumference --      Peak Flow --      Pain Score 10/27/24 1031 8     Pain Loc --      Pain Education --      Exclude from Growth Chart --     Most recent vital signs: Vitals:   10/27/24 1246 10/27/24 1417  BP:  130/80  Pulse: (!) 112 89  Resp:  16  Temp:    SpO2:  100%     General: Awake, no distress.  CV:  Good peripheral perfusion. RRR, RP 2+, equal pulses all extremities Resp:  Normal effort. CTAB Abd:  No distention. Nontender to deep palpation throughout Neuro:  Aox4, CN II-XII intact, FNF wnl, finger taps fast b/l, 5/5 strength in bilateral finger extension/grip, arm flexion/extension, EHL/FHL. BUE AG 10+ sec no drift, BLE AG 5+ sec no drift. Ambulates with steady gait. SILT. Negative Rhomberg.    ED Results / Procedures / Treatments   Labs (all labs ordered are listed, but only abnormal results are displayed) Labs Reviewed  BASIC METABOLIC PANEL WITH GFR - Abnormal; Notable for the following components:      Result Value   Glucose, Bld 118 (*)  All other components within normal limits  CBC - Abnormal; Notable for the following components:   Hemoglobin 11.9 (*)    All other components within normal limits  D-DIMER, QUANTITATIVE  TROPONIN T, HIGH SENSITIVITY  TROPONIN T, HIGH SENSITIVITY     EKG  See ED course below   RADIOLOGY Radiology interpreted by myself and radiology report reviewed.  No acute pathology identified.    PROCEDURES:  Critical Care performed: No  Procedures   MEDICATIONS ORDERED IN ED: Medications  acetaminophen  (TYLENOL ) tablet 1,000 mg (1,000 mg Oral Given 10/27/24 1250)  sodium chloride  0.9 % bolus 1,000 mL (0 mLs Intravenous Stopped 10/27/24 1418)     IMPRESSION / MDM / ASSESSMENT AND PLAN / ED COURSE  I reviewed the triage vital signs and the nursing notes.                              DDX/MDM/AP: Differential diagnosis includes, but is not limited to, likely MSK  strain in the setting of recently moving heavy object --physical exam highly suggestive of this.  Patient does appear to have some baseline tachycardia and says she often feels anxious and medical encounters, suspect tachycardia likely due to this, consider underlying dehydration.  Consider ACS, PE.  Do not clinically suspect aortic dissection at this time.  Regarding her headache, suspect benign headache type including tension or migraine, nonfocal neurologic exam here, no no red flags on history, no indication for further workup.  Plan: - Labs - Tylenol , IV fluid, Lidoderm  patch  - Patient took Advil  shortly prior to arrival, will defer Toradol  - Declined migraine cocktail - Chest x-ray - EKG - No indication for repeat imaging at this time  Patient's presentation is most consistent with acute presentation with potential threat to life or bodily function.  The patient is on the cardiac monitor to evaluate for evidence of arrhythmia and/or significant heart rate changes.  ED course below.   Clinical Course as of 10/27/24 2020  Thu Oct 27, 2024  1138 CBC, BMP reviewed, unremarkable  Troponin normal [MM]  1139 CXR: IMPRESSION: No active cardiopulmonary disease.   [MM]  1139 Ecg = sinus tachycardia, rate 133, no gross ST elevation or depression, no significant repolarization abnormality, leftward axis, normal intervals.  No clear evidence of ischemia no arrhythmia on my interpretation. [MM]  1310 Dimer wnl Rpt trop stable [MM]  1312 Patient reevaluated, left shoulder/chest discomfort has resolved  Reassured by unremarkable workup including serial troponins  Heart rate around 105 - 110 which on review of her prior medical encounters is not outside of her baseline.  Will continue with IV fluid but plan for discharge home shortly.  Already has a cardiologist with whom she can follow-up with though I highly doubt cardiac etiology of chest discomfort at this time, also encouraged to  follow-up with her primary care provider.  Rx Tylenol , Motrin , Lidoderm  patches, highly suspect MSK strain as etiology of presentation today.  ED return precautions in place.  Patient agrees with plan. [MM]    Clinical Course User Index [MM] Clarine Ozell LABOR, MD     FINAL CLINICAL IMPRESSION(S) / ED DIAGNOSES   Final diagnoses:  Atypical chest pain  Chest wall pain     Rx / DC Orders   ED Discharge Orders          Ordered    acetaminophen  (TYLENOL ) 500 MG tablet  Every 6 hours PRN  10/27/24 1312    ibuprofen  (MOTRIN  IB) 200 MG tablet  Every 8 hours PRN        10/27/24 1312    lidocaine  (LIDODERM ) 5 %  Every 24 hours        10/27/24 1312             Note:  This document was prepared using Dragon voice recognition software and may include unintentional dictation errors.   Clarine Ozell LABOR, MD 10/27/24 2020

## 2024-10-27 NOTE — ED Triage Notes (Signed)
 Pt arrives via POV with c/o dizziness, CP and HA that started this morning as they were going about their day. The HA is on the right side around their temple/behind their eye, they CP is a tightness in the upper left side that travels to their shoulder, under their armpit to their back. Pt denies SOB. Pt is A&Ox4 and ambulatory during triage.

## 2024-10-27 NOTE — Discharge Instructions (Addendum)
 Your evaluation in the emergency department was overall reassuring.  I suspect you have muscle strain of your chest wall.  You can use Tylenol , Motrin , and Lidoderm  patch as needed for any ongoing discomfort.  Please do follow-up with your primary care provider for reevaluation, and you could also follow-up with your cardiologist in addition.  Return to the emergency department with any new or worsening symptoms.

## 2024-10-27 NOTE — ED Notes (Signed)
 Per Cindy Ozell LABOR, MD if patient HR elevated patient will need an IV and IV team will need to be consulted. Pt HR 112. IV team consult ordered placed.

## 2024-10-28 ENCOUNTER — Other Ambulatory Visit: Payer: Self-pay

## 2024-11-03 ENCOUNTER — Other Ambulatory Visit: Payer: Self-pay

## 2024-11-03 MED ORDER — VALACYCLOVIR HCL 1 G PO TABS
1000.0000 mg | ORAL_TABLET | Freq: Two times a day (BID) | ORAL | 0 refills | Status: AC
  Filled 2024-11-03: qty 14, 7d supply, fill #0

## 2024-11-18 ENCOUNTER — Other Ambulatory Visit: Payer: Self-pay

## 2024-11-18 ENCOUNTER — Other Ambulatory Visit (HOSPITAL_COMMUNITY): Payer: Self-pay

## 2024-11-24 ENCOUNTER — Other Ambulatory Visit (HOSPITAL_COMMUNITY): Payer: Self-pay

## 2024-11-24 ENCOUNTER — Other Ambulatory Visit: Payer: Self-pay

## 2024-11-24 MED ORDER — SPIRONOLACTONE 100 MG PO TABS
100.0000 mg | ORAL_TABLET | Freq: Every day | ORAL | 0 refills | Status: AC
  Filled 2024-11-24 – 2024-11-29 (×3): qty 90, 90d supply, fill #0

## 2024-11-24 MED ORDER — VITAMIN D (ERGOCALCIFEROL) 50000 UNITS PO CAPS
1.0000 | ORAL_CAPSULE | ORAL | 1 refills | Status: AC
  Filled 2024-11-24: qty 12, 84d supply, fill #0
  Filled 2024-11-24: qty 12, 90d supply, fill #0
  Filled 2024-11-29: qty 12, 84d supply, fill #0

## 2024-11-24 MED ORDER — MINOXIDIL 2.5 MG PO TABS
2.5000 mg | ORAL_TABLET | Freq: Every day | ORAL | 1 refills | Status: AC
  Filled 2024-11-24 – 2024-11-29 (×3): qty 90, 90d supply, fill #0

## 2024-11-25 ENCOUNTER — Other Ambulatory Visit: Payer: Self-pay

## 2024-11-29 ENCOUNTER — Other Ambulatory Visit: Payer: Self-pay

## 2024-12-14 ENCOUNTER — Other Ambulatory Visit: Payer: Self-pay

## 2024-12-14 MED ORDER — ESTRADIOL 0.01 % VA CREA
2.0000 g | TOPICAL_CREAM | VAGINAL | 0 refills | Status: AC
  Filled 2024-12-14: qty 42.5, 70d supply, fill #0

## 2024-12-14 MED ORDER — TIZANIDINE HCL 4 MG PO TABS
4.0000 mg | ORAL_TABLET | Freq: Two times a day (BID) | ORAL | 0 refills | Status: AC | PRN
  Filled 2024-12-14: qty 20, 10d supply, fill #0

## 2024-12-15 ENCOUNTER — Other Ambulatory Visit: Payer: Self-pay

## 2024-12-22 ENCOUNTER — Other Ambulatory Visit: Payer: Self-pay

## 2024-12-23 ENCOUNTER — Other Ambulatory Visit: Payer: Self-pay
